# Patient Record
Sex: Female | Born: 1935 | Race: White | Hispanic: No
Health system: Midwestern US, Community
[De-identification: ages and names within clinical notes are randomized; demographics above are authoritative.]

## PROBLEM LIST (undated history)

## (undated) DIAGNOSIS — I10 Essential (primary) hypertension: Secondary | ICD-10-CM

## (undated) DIAGNOSIS — F32A Depression, unspecified: Secondary | ICD-10-CM

## (undated) DIAGNOSIS — J189 Pneumonia, unspecified organism: Secondary | ICD-10-CM

## (undated) DIAGNOSIS — E039 Hypothyroidism, unspecified: Secondary | ICD-10-CM

## (undated) DIAGNOSIS — K5090 Crohn's disease, unspecified, without complications: Secondary | ICD-10-CM

## (undated) DIAGNOSIS — M1990 Unspecified osteoarthritis, unspecified site: Secondary | ICD-10-CM

## (undated) DIAGNOSIS — F419 Anxiety disorder, unspecified: Secondary | ICD-10-CM

## (undated) DIAGNOSIS — F329 Major depressive disorder, single episode, unspecified: Secondary | ICD-10-CM

## (undated) DIAGNOSIS — M810 Age-related osteoporosis without current pathological fracture: Secondary | ICD-10-CM

## (undated) DIAGNOSIS — I8390 Asymptomatic varicose veins of unspecified lower extremity: Secondary | ICD-10-CM

## (undated) HISTORY — PX: COLONOSCOPY: SHX174

## (undated) HISTORY — PX: ABDOMINAL HYSTERECTOMY: SHX81

---

## 2012-02-01 DIAGNOSIS — I10 Essential (primary) hypertension: Secondary | ICD-10-CM | POA: Diagnosis not present

## 2012-02-01 DIAGNOSIS — E7800 Pure hypercholesterolemia, unspecified: Secondary | ICD-10-CM | POA: Diagnosis not present

## 2012-02-01 DIAGNOSIS — E039 Hypothyroidism, unspecified: Secondary | ICD-10-CM | POA: Diagnosis not present

## 2012-02-08 DIAGNOSIS — I10 Essential (primary) hypertension: Secondary | ICD-10-CM | POA: Diagnosis not present

## 2012-02-08 DIAGNOSIS — J3089 Other allergic rhinitis: Secondary | ICD-10-CM | POA: Diagnosis not present

## 2012-02-08 DIAGNOSIS — E039 Hypothyroidism, unspecified: Secondary | ICD-10-CM | POA: Diagnosis not present

## 2012-02-08 DIAGNOSIS — E7800 Pure hypercholesterolemia, unspecified: Secondary | ICD-10-CM | POA: Diagnosis not present

## 2012-04-17 DIAGNOSIS — J0100 Acute maxillary sinusitis, unspecified: Secondary | ICD-10-CM | POA: Diagnosis not present

## 2012-08-01 DIAGNOSIS — E7800 Pure hypercholesterolemia, unspecified: Secondary | ICD-10-CM | POA: Diagnosis not present

## 2012-08-01 DIAGNOSIS — I10 Essential (primary) hypertension: Secondary | ICD-10-CM | POA: Diagnosis not present

## 2012-08-01 DIAGNOSIS — E039 Hypothyroidism, unspecified: Secondary | ICD-10-CM | POA: Diagnosis not present

## 2012-08-08 DIAGNOSIS — F411 Generalized anxiety disorder: Secondary | ICD-10-CM | POA: Diagnosis not present

## 2012-08-08 DIAGNOSIS — E039 Hypothyroidism, unspecified: Secondary | ICD-10-CM | POA: Diagnosis not present

## 2012-08-08 DIAGNOSIS — R7309 Other abnormal glucose: Secondary | ICD-10-CM | POA: Diagnosis not present

## 2012-08-08 DIAGNOSIS — G4700 Insomnia, unspecified: Secondary | ICD-10-CM | POA: Diagnosis not present

## 2012-08-08 DIAGNOSIS — Z23 Encounter for immunization: Secondary | ICD-10-CM | POA: Diagnosis not present

## 2013-03-07 DIAGNOSIS — K591 Functional diarrhea: Secondary | ICD-10-CM | POA: Diagnosis not present

## 2013-03-08 DIAGNOSIS — H26499 Other secondary cataract, unspecified eye: Secondary | ICD-10-CM | POA: Diagnosis not present

## 2013-03-08 DIAGNOSIS — R197 Diarrhea, unspecified: Secondary | ICD-10-CM | POA: Diagnosis not present

## 2013-03-09 DIAGNOSIS — R197 Diarrhea, unspecified: Secondary | ICD-10-CM | POA: Diagnosis not present

## 2013-03-15 DIAGNOSIS — F341 Dysthymic disorder: Secondary | ICD-10-CM | POA: Diagnosis not present

## 2013-03-15 DIAGNOSIS — E782 Mixed hyperlipidemia: Secondary | ICD-10-CM | POA: Diagnosis not present

## 2013-03-15 DIAGNOSIS — R197 Diarrhea, unspecified: Secondary | ICD-10-CM | POA: Diagnosis not present

## 2013-03-15 DIAGNOSIS — M545 Low back pain: Secondary | ICD-10-CM | POA: Diagnosis not present

## 2013-03-15 DIAGNOSIS — Z79899 Other long term (current) drug therapy: Secondary | ICD-10-CM | POA: Diagnosis not present

## 2013-03-28 DIAGNOSIS — M1610 Unilateral primary osteoarthritis, unspecified hip: Secondary | ICD-10-CM | POA: Diagnosis not present

## 2013-04-06 DIAGNOSIS — M1610 Unilateral primary osteoarthritis, unspecified hip: Secondary | ICD-10-CM | POA: Diagnosis not present

## 2013-04-06 DIAGNOSIS — M169 Osteoarthritis of hip, unspecified: Secondary | ICD-10-CM | POA: Diagnosis not present

## 2013-05-22 DIAGNOSIS — M1610 Unilateral primary osteoarthritis, unspecified hip: Secondary | ICD-10-CM | POA: Diagnosis not present

## 2013-07-05 DIAGNOSIS — R197 Diarrhea, unspecified: Secondary | ICD-10-CM | POA: Diagnosis not present

## 2013-07-05 DIAGNOSIS — Z006 Encounter for examination for normal comparison and control in clinical research program: Secondary | ICD-10-CM | POA: Diagnosis not present

## 2013-07-05 DIAGNOSIS — I10 Essential (primary) hypertension: Secondary | ICD-10-CM | POA: Diagnosis not present

## 2013-07-05 DIAGNOSIS — F341 Dysthymic disorder: Secondary | ICD-10-CM | POA: Diagnosis not present

## 2013-07-09 DIAGNOSIS — R1013 Epigastric pain: Secondary | ICD-10-CM | POA: Diagnosis not present

## 2013-07-09 DIAGNOSIS — R1084 Generalized abdominal pain: Secondary | ICD-10-CM | POA: Diagnosis not present

## 2013-07-09 DIAGNOSIS — R197 Diarrhea, unspecified: Secondary | ICD-10-CM | POA: Diagnosis not present

## 2013-07-09 DIAGNOSIS — R195 Other fecal abnormalities: Secondary | ICD-10-CM | POA: Diagnosis not present

## 2013-07-12 DIAGNOSIS — R1012 Left upper quadrant pain: Secondary | ICD-10-CM | POA: Diagnosis not present

## 2013-07-12 DIAGNOSIS — R1084 Generalized abdominal pain: Secondary | ICD-10-CM | POA: Diagnosis not present

## 2013-07-12 DIAGNOSIS — R197 Diarrhea, unspecified: Secondary | ICD-10-CM | POA: Diagnosis not present

## 2013-07-12 DIAGNOSIS — R1013 Epigastric pain: Secondary | ICD-10-CM | POA: Diagnosis not present

## 2013-08-01 DIAGNOSIS — R195 Other fecal abnormalities: Secondary | ICD-10-CM | POA: Diagnosis not present

## 2013-08-01 DIAGNOSIS — R1084 Generalized abdominal pain: Secondary | ICD-10-CM | POA: Diagnosis not present

## 2013-08-01 DIAGNOSIS — R197 Diarrhea, unspecified: Secondary | ICD-10-CM | POA: Diagnosis not present

## 2013-08-01 DIAGNOSIS — K5289 Other specified noninfective gastroenteritis and colitis: Secondary | ICD-10-CM | POA: Diagnosis not present

## 2013-08-22 DIAGNOSIS — M1610 Unilateral primary osteoarthritis, unspecified hip: Secondary | ICD-10-CM | POA: Diagnosis not present

## 2013-08-28 DIAGNOSIS — Z23 Encounter for immunization: Secondary | ICD-10-CM | POA: Diagnosis not present

## 2013-09-03 DIAGNOSIS — R195 Other fecal abnormalities: Secondary | ICD-10-CM | POA: Diagnosis not present

## 2013-09-03 DIAGNOSIS — K5289 Other specified noninfective gastroenteritis and colitis: Secondary | ICD-10-CM | POA: Diagnosis not present

## 2013-09-17 DIAGNOSIS — E039 Hypothyroidism, unspecified: Secondary | ICD-10-CM | POA: Diagnosis not present

## 2013-09-17 DIAGNOSIS — I10 Essential (primary) hypertension: Secondary | ICD-10-CM | POA: Diagnosis not present

## 2013-09-17 DIAGNOSIS — R5381 Other malaise: Secondary | ICD-10-CM | POA: Diagnosis not present

## 2013-09-24 DIAGNOSIS — J329 Chronic sinusitis, unspecified: Secondary | ICD-10-CM | POA: Diagnosis not present

## 2013-10-15 DIAGNOSIS — K5289 Other specified noninfective gastroenteritis and colitis: Secondary | ICD-10-CM | POA: Diagnosis not present

## 2013-12-11 DIAGNOSIS — M1610 Unilateral primary osteoarthritis, unspecified hip: Secondary | ICD-10-CM | POA: Diagnosis not present

## 2013-12-14 DIAGNOSIS — M1610 Unilateral primary osteoarthritis, unspecified hip: Secondary | ICD-10-CM | POA: Diagnosis not present

## 2013-12-14 DIAGNOSIS — M169 Osteoarthritis of hip, unspecified: Secondary | ICD-10-CM | POA: Diagnosis not present

## 2014-03-13 DIAGNOSIS — I10 Essential (primary) hypertension: Secondary | ICD-10-CM | POA: Diagnosis not present

## 2014-03-13 DIAGNOSIS — Z79899 Other long term (current) drug therapy: Secondary | ICD-10-CM | POA: Diagnosis not present

## 2014-03-13 DIAGNOSIS — E7800 Pure hypercholesterolemia, unspecified: Secondary | ICD-10-CM | POA: Diagnosis not present

## 2014-03-13 DIAGNOSIS — E039 Hypothyroidism, unspecified: Secondary | ICD-10-CM | POA: Diagnosis not present

## 2014-03-13 DIAGNOSIS — F341 Dysthymic disorder: Secondary | ICD-10-CM | POA: Diagnosis not present

## 2014-03-13 DIAGNOSIS — Z0000 Encounter for general adult medical examination without abnormal findings: Secondary | ICD-10-CM | POA: Diagnosis not present

## 2014-04-02 DIAGNOSIS — Z1231 Encounter for screening mammogram for malignant neoplasm of breast: Secondary | ICD-10-CM | POA: Diagnosis not present

## 2014-04-02 DIAGNOSIS — R928 Other abnormal and inconclusive findings on diagnostic imaging of breast: Secondary | ICD-10-CM | POA: Diagnosis not present

## 2014-05-02 DIAGNOSIS — M1610 Unilateral primary osteoarthritis, unspecified hip: Secondary | ICD-10-CM | POA: Diagnosis not present

## 2014-05-06 DIAGNOSIS — R928 Other abnormal and inconclusive findings on diagnostic imaging of breast: Secondary | ICD-10-CM | POA: Diagnosis not present

## 2014-07-05 DIAGNOSIS — H524 Presbyopia: Secondary | ICD-10-CM | POA: Diagnosis not present

## 2014-07-05 DIAGNOSIS — H2589 Other age-related cataract: Secondary | ICD-10-CM | POA: Diagnosis not present

## 2014-08-01 DIAGNOSIS — K5289 Other specified noninfective gastroenteritis and colitis: Secondary | ICD-10-CM | POA: Diagnosis not present

## 2014-08-02 DIAGNOSIS — K5289 Other specified noninfective gastroenteritis and colitis: Secondary | ICD-10-CM | POA: Diagnosis not present

## 2014-08-29 DIAGNOSIS — K5289 Other specified noninfective gastroenteritis and colitis: Secondary | ICD-10-CM | POA: Diagnosis not present

## 2014-09-02 DIAGNOSIS — J159 Unspecified bacterial pneumonia: Secondary | ICD-10-CM | POA: Diagnosis not present

## 2014-09-12 DIAGNOSIS — F418 Other specified anxiety disorders: Secondary | ICD-10-CM | POA: Diagnosis not present

## 2014-09-12 DIAGNOSIS — E039 Hypothyroidism, unspecified: Secondary | ICD-10-CM | POA: Diagnosis not present

## 2014-09-12 DIAGNOSIS — Z23 Encounter for immunization: Secondary | ICD-10-CM | POA: Diagnosis not present

## 2014-09-12 DIAGNOSIS — M810 Age-related osteoporosis without current pathological fracture: Secondary | ICD-10-CM | POA: Diagnosis not present

## 2014-09-12 DIAGNOSIS — I10 Essential (primary) hypertension: Secondary | ICD-10-CM | POA: Diagnosis not present

## 2014-09-12 DIAGNOSIS — E780 Pure hypercholesterolemia: Secondary | ICD-10-CM | POA: Diagnosis not present

## 2014-10-29 DIAGNOSIS — K5289 Other specified noninfective gastroenteritis and colitis: Secondary | ICD-10-CM | POA: Diagnosis not present

## 2014-10-31 DIAGNOSIS — M160 Bilateral primary osteoarthritis of hip: Secondary | ICD-10-CM | POA: Diagnosis not present

## 2015-01-15 DIAGNOSIS — Z79899 Other long term (current) drug therapy: Secondary | ICD-10-CM | POA: Diagnosis not present

## 2015-01-15 DIAGNOSIS — E039 Hypothyroidism, unspecified: Secondary | ICD-10-CM | POA: Diagnosis not present

## 2015-01-15 DIAGNOSIS — Z23 Encounter for immunization: Secondary | ICD-10-CM | POA: Diagnosis not present

## 2015-01-15 DIAGNOSIS — I10 Essential (primary) hypertension: Secondary | ICD-10-CM | POA: Diagnosis not present

## 2015-01-15 DIAGNOSIS — E780 Pure hypercholesterolemia: Secondary | ICD-10-CM | POA: Diagnosis not present

## 2015-01-21 ENCOUNTER — Ambulatory Visit: Payer: Self-pay | Admitting: Orthopedic Surgery

## 2015-01-21 NOTE — H&P (Signed)
Diana Ramsey. Wignall DOB: Mar 06, 1935 Single / Language: Diana Ramsey / Race: White Female Date of Admission:  Right Hip Pain History of Present Illness The patient is a 80 year old female who comes in for a preoperative History and Physical. The patient is scheduled for a right total hip arthroplasty (anterior approach) to be performed by Dr. Dione Plover. Aluisio, MD at [facility] on 02-19-2015. The patient is a 80 year old female who presents for follow up of their hip. The patient is being followed for their bilateral (right is worse at this point) osteoarthritis. They were initially seen as a second opinion. Symptoms reported include: pain, pain after sitting, aching, stiffness, pain with weightbearing and difficulty arising from chair. The pain is in her groin, lateral hip, radiating down to her anterior thigh. She states that she has had left hip pain in the past, but it does not bother her much now. She says it is getting harder to bend and do things that require bending. She is hurting at times at night. She is able to do most of what she desires with some limitations. The patient feels that they are doing poorly and report their pain level to be 6 / 10. Current treatment includes: activity modification and NSAIDs. The following medication has been used for pain control: antiinflammatory medication (Aleve prn). Unfortunately, her problems have gotten much worse compared to when she was initially seen six months ago. She now states that she is having pain at all times. It is limiting what she can and cannot do. She had an intraarticular injection over the summer which did not provide any benefit. Pain is in her groin and anterior thigh radiating towards her knee. It is worse with weightbearing but also occurring at rest. It is occurring at night. She states that she cannot really tolerate it anymore.  They have been treated conservatively in the past for the above stated problem and despite  conservative measures, they continue to have progressive pain and severe functional limitations and dysfunction. They have failed non-operative management including home exercise, medications, and injections. It is felt that they would benefit from undergoing total joint replacement. Risks and benefits of the procedure have been discussed with the patient and they elect to proceed with surgery. There are no active contraindications to surgery such as ongoing infection or rapidly progressive neurological disease.  Problem List/Past Medical Primary localized osteoarthritis of right hip (M16.11) Hypothyroidism High blood pressure Ulcerative Colitis frequent flareups Hypercholesterolemia Anxiety Disorder Depression Pneumonia Past History - Last bout October 2015 Varicose veins Osteoporosis Measles Mumps Peripheral Neuropathy Feet  Allergies  Penicillins Rash.  Family History  Liver Disease, Chronic Father. Cancer Father. Hypertension Father, Mother.  Social History  Living situation live alone Exercise Exercises weekly; does running / walking Current drinker 05/02/2014: Currently drinks wine only occasionally per week Children 1 Current work status retired Tobacco use Former smoker. 05/02/2014: smoke(d) less than 1/2 pack(s) per day Tobacco / smoke exposure 05/02/2014: no Marital status widowed Not under pain contract No history of drug/alcohol rehab Number of flights of stairs before winded 4-5 Post-Surgical Plans Home  Medication History  CeleBREX (200MG  Capsule, Oral daily) Active. Calcium Lactate (600MG  Tablet, Oral two times daily) Active. ALPRAZolam (0.5MG  Tablet, Oral two times daily) Active. Pravastatin Sodium (80MG  Tablet, Oral daily) Active. Boniva (150MG  Tablet, Oral monthly) Active. Mirtazapine (30MG  Tablet, Oral daily) Active. Metoprolol Tartrate (50MG  Tablet, Oral two times daily) Active. Sertraline HCl (100MG  Tablet, Oral  daily) Active. TraMADol HCl (  50MG  Tablet, 2 Oral per day as needed) Active. Levothyroxine Sodium (50MCG Tablet, Oral daily) Active. Budesonide ER (3MG  Capsule ER 24HR, Oral daily) Active.  Past Surgical History Hysterectomy Date: 29. partial (non-cancerous)  Review of Systems General Not Present- Chills, Fatigue, Fever, Memory Loss, Night Sweats, Weight Gain and Weight Loss. Skin Not Present- Eczema, Hives, Itching, Lesions and Rash. HEENT Not Present- Dentures, Double Vision, Headache, Hearing Loss, Tinnitus and Visual Loss. Respiratory Not Present- Allergies, Chronic Cough, Coughing up blood, Shortness of breath at rest and Shortness of breath with exertion. Cardiovascular Not Present- Chest Pain, Difficulty Breathing Lying Down, Murmur, Palpitations, Racing/skipping heartbeats and Swelling. Gastrointestinal Not Present- Abdominal Pain, Bloody Stool, Constipation, Diarrhea, Difficulty Swallowing, Heartburn, Jaundice, Loss of appetitie, Nausea and Vomiting. Female Genitourinary Not Present- Blood in Urine, Discharge, Flank Pain, Incontinence, Painful Urination, Urgency, Urinary frequency, Urinary Retention, Urinating at Night and Weak urinary stream. Musculoskeletal Present- Joint Pain. Not Present- Back Pain, Joint Swelling, Morning Stiffness, Muscle Pain, Muscle Weakness and Spasms. Neurological Not Present- Blackout spells, Difficulty with balance, Dizziness, Paralysis, Tremor and Weakness. Psychiatric Not Present- Insomnia.  Vitals Weight: 142 lb Height: 62in Weight was reported by patient. Body Surface Area: 1.65 m Body Mass Index: 25.97 kg/m  BP: 162/78 (Sitting, Right Arm, Standard)   Physical Exam The physical exam findings are as follows: Note:Patietn is a 80 year old female with continued hip pain. Patient is accompanied today by her sister, Diana Ramsey.  General Mental Status -Alert, cooperative and good historian. General Appearance-pleasant, Not in acute  distress. Orientation-Oriented X3. Build & Nutrition-Well nourished and Well developed.  Head and Neck Head-normocephalic, atraumatic . Neck Global Assessment - supple, no bruit auscultated on the right, no bruit auscultated on the left.  Eye Pupil - Bilateral-Regular and Round. Motion - Bilateral-EOMI.  Chest and Lung Exam Auscultation Breath sounds - clear at anterior chest wall and clear at posterior chest wall. Adventitious sounds - No Adventitious sounds.  Cardiovascular Auscultation Rhythm - Regular rate and rhythm. Heart Sounds - S1 WNL and S2 WNL. Murmurs & Other Heart Sounds - Auscultation of the heart reveals - No Murmurs.  Abdomen Palpation/Percussion Tenderness - Abdomen is non-tender to palpation. Rigidity (guarding) - Abdomen is soft. Auscultation Auscultation of the abdomen reveals - Bowel sounds normal.  Female Genitourinary Note: Not done, not pertinent to present illness   Musculoskeletal Note: On exam, well developed female in no distress. Her left hip has normal motion with no discomfort. Her right hip flexion is about 100, rotation in 20 with significant pain, out 30 with significant pain and abduction 30 with significant pain. She has an antalgic gait pattern.  RADIOGRAPHS: We obtained new radiographs today. AP pelvis and lateral right hip show she still has joint space left superiorly but inferiorly has very large osteophytes especially noticed on the lateral view.  Assessment & Plan Primary localized osteoarthritis of right hip (M16.11) Note:Surgical Plans: Right Total Hip Repalcement - Anterior Approach  Disposition: Home  PCP: Dr. Kennith Maes - Patient has been seen preoperatively and felt to be stable for surgery. [facility]  IV TXA  Anesthesia Issues: None  Signed electronically by Joelene Millin, III PA-C

## 2015-01-21 NOTE — Progress Notes (Addendum)
Diana Ramsey. Diana Ramsey DOB: 04-Feb-1935 Single / Language: Cleophus Molt / Race: White Female Date of Admission: 02-19-2015 CC: Right Hip Pain History of Present Illness The patient is a 80 year old female who comes in for a preoperative History and Physical. The patient is scheduled for a right total hip arthroplasty (anterior approach) to be performed by Dr. Dione Plover. Aluisio, MD at [facility] on 02-19-2015. The patient is a 80 year old female who presents for follow up of their hip. The patient is being followed for their bilateral (right is worse at this point) osteoarthritis. They were initially seen as a second opinion. Symptoms reported include: pain, pain after sitting, aching, stiffness, pain with weightbearing and difficulty arising from chair. The pain is in her groin, lateral hip, radiating down to her anterior thigh. She states that she has had left hip pain in the past, but it does not bother her much now. She says it is getting harder to bend and do things that require bending. She is hurting at times at night. She is able to do most of what she desires with some limitations. The patient feels that they are doing poorly and report their pain level to be 6 / 10. Current treatment includes: activity modification and NSAIDs. The following medication has been used for pain control: antiinflammatory medication (Aleve prn). Unfortunately, her problems have gotten much worse compared to when she was initially seen six months ago. She now states that she is having pain at all times. It is limiting what she can and cannot do. She had an intraarticular injection over the summer which did not provide any benefit. Pain is in her groin and anterior thigh radiating towards her knee. It is worse with weightbearing but also occurring at rest. It is occurring at night. She states that she cannot really tolerate it anymore.  They have been treated conservatively in the past for the above stated problem and  despite conservative measures, they continue to have progressive pain and severe functional limitations and dysfunction. They have failed non-operative management including home exercise, medications, and injections. It is felt that they would benefit from undergoing total joint replacement. Risks and benefits of the procedure have been discussed with the patient and they elect to proceed with surgery. There are no active contraindications to surgery such as ongoing infection or rapidly progressive neurological disease.  Problem List/Past Medical Primary localized osteoarthritis of right hip (M16.11) Hypothyroidism High blood pressure Ulcerative Colitis frequent flareups Hypercholesterolemia Anxiety Disorder Depression Pneumonia Past History - Last bout October 2015 Varicose veins Osteoporosis Measles Mumps Peripheral Neuropathy Feet  Allergies  Penicillins Rash.  Family History  Liver Disease, Chronic Father. Cancer Father. Hypertension Father, Mother.  Social History  Living situation live alone Exercise Exercises weekly; does running / walking Current drinker 05/02/2014: Currently drinks wine only occasionally per week Children 1 Current work status retired Tobacco use Former smoker. 05/02/2014: smoke(d) less than 1/2 pack(s) per day Tobacco / smoke exposure 05/02/2014: no Marital status widowed Not under pain contract No history of drug/alcohol rehab Number of flights of stairs before winded 4-5 Post-Surgical Plans Home  Medication History  CeleBREX (200MG  Capsule, Oral daily) Active. Calcium Lactate (600MG  Tablet, Oral two times daily) Active. ALPRAZolam (0.5MG  Tablet, Oral two times daily) Active. Pravastatin Sodium (80MG  Tablet, Oral daily) Active. Boniva (150MG  Tablet, Oral monthly) Active. Mirtazapine (30MG  Tablet, Oral daily) Active. Metoprolol Tartrate (50MG  Tablet, Oral two times daily) Active. Sertraline HCl (100MG   Tablet, Oral daily) Active. TraMADol  HCl (50MG  Tablet, 2 Oral per day as needed) Active. Levothyroxine Sodium (50MCG Tablet, Oral daily) Active. Budesonide ER (3MG  Capsule ER 24HR, Oral daily) Active.  Past Surgical History Hysterectomy Date: 64. partial (non-cancerous)  Review of Systems General Not Present- Chills, Fatigue, Fever, Memory Loss, Night Sweats, Weight Gain and Weight Loss. Skin Not Present- Eczema, Hives, Itching, Lesions and Rash. HEENT Not Present- Dentures, Double Vision, Headache, Hearing Loss, Tinnitus and Visual Loss. Respiratory Not Present- Allergies, Chronic Cough, Coughing up blood, Shortness of breath at rest and Shortness of breath with exertion. Cardiovascular Not Present- Chest Pain, Difficulty Breathing Lying Down, Murmur, Palpitations, Racing/skipping heartbeats and Swelling. Gastrointestinal Not Present- Abdominal Pain, Bloody Stool, Constipation, Diarrhea, Difficulty Swallowing, Heartburn, Jaundice, Loss of appetitie, Nausea and Vomiting. Female Genitourinary Not Present- Blood in Urine, Discharge, Flank Pain, Incontinence, Painful Urination, Urgency, Urinary frequency, Urinary Retention, Urinating at Night and Weak urinary stream. Musculoskeletal Present- Joint Pain. Not Present- Back Pain, Joint Swelling, Morning Stiffness, Muscle Pain, Muscle Weakness and Spasms. Neurological Not Present- Blackout spells, Difficulty with balance, Dizziness, Paralysis, Tremor and Weakness. Psychiatric Not Present- Insomnia.  Vitals Weight: 142 lb Height: 62in Weight was reported by patient. Body Surface Area: 1.65 m Body Mass Index: 25.97 kg/m  BP: 162/78 (Sitting, Right Arm, Standard)   Physical Exam The physical exam findings are as follows: Note:Patietn is a 80 year old female with continued hip pain. Patient is accompanied today by her sister, Vaughan Basta.  General Mental Status -Alert, cooperative and good historian. General Appearance-pleasant,  Not in acute distress. Orientation-Oriented X3. Build & Nutrition-Well nourished and Well developed.  Head and Neck Head-normocephalic, atraumatic . Neck Global Assessment - supple, no bruit auscultated on the right, no bruit auscultated on the left.  Eye Pupil - Bilateral-Regular and Round. Motion - Bilateral-EOMI.  Chest and Lung Exam Auscultation Breath sounds - clear at anterior chest wall and clear at posterior chest wall. Adventitious sounds - No Adventitious sounds.  Cardiovascular Auscultation Rhythm - Regular rate and rhythm. Heart Sounds - S1 WNL and S2 WNL. Murmurs & Other Heart Sounds - Auscultation of the heart reveals - No Murmurs.  Abdomen Palpation/Percussion Tenderness - Abdomen is non-tender to palpation. Rigidity (guarding) - Abdomen is soft. Auscultation Auscultation of the abdomen reveals - Bowel sounds normal.  Female Genitourinary Note: Not done, not pertinent to present illness   Musculoskeletal Note: On exam, well developed female in no distress. Her left hip has normal motion with no discomfort. Her right hip flexion is about 100, rotation in 20 with significant pain, out 30 with significant pain and abduction 30 with significant pain. She has an antalgic gait pattern.  RADIOGRAPHS: We obtained new radiographs today. AP pelvis and lateral right hip show she still has joint space left superiorly but inferiorly has very large osteophytes especially noticed on the lateral view.  Assessment & Plan Primary localized osteoarthritis of right hip (M16.11) Note:Surgical Plans: Right Total Hip Repalcement - Anterior Approach  Disposition: Home  PCP: Dr. Kennith Maes - Patient has been seen preoperatively and felt to be stable for surgery. [facility]  IV TXA  Anesthesia Issues: None  Signed electronically by Joelene Millin, III PA-C

## 2015-01-23 DIAGNOSIS — K5289 Other specified noninfective gastroenteritis and colitis: Secondary | ICD-10-CM | POA: Diagnosis not present

## 2015-01-30 ENCOUNTER — Ambulatory Visit: Payer: Self-pay | Admitting: Orthopedic Surgery

## 2015-01-30 NOTE — Progress Notes (Signed)
Preoperative surgical orders have been place into the Epic hospital system for Diana Ramsey on 01/30/2015, 2:03 PM  by Mickel Crow for surgery on 02-19-15.  Preop Total Hip - Anterior Approach orders including Experel Injecion, IV Tylenol, and IV Decadron as long as there are no contraindications to the above medications. Arlee Muslim, PA-C

## 2015-02-10 NOTE — Patient Instructions (Signed)
Your procedure is scheduled on:  02/19/15  [facility]  Report to [place] at     1:05 PM   Call this number if you have problems the morning of surgery: [phone]        Do not eat food  after Midnight. Tuesday NIGHT--- MAY HAVE CLEAR LIQUIDS Wednesday MORNING UNTIL 09:30am-  THEN NONE   Take these medicines the morning of surgery with A SIP OF WATER:METOPROLOL, Alprazolam, Levothyroxine,Zoloft May take Tramadol if needed   .  Contacts, dentures or partial plates, or metal hairpins  can not be worn to surgery. Your family will be responsible for glasses, dentures, hearing aides while you are in surgery  Leave suitcase in the car. After surgery it may be brought to your room.  For patients admitted to the hospital, checkout time is 11:00 AM day of  discharge.         [facility] IS NOT RESPONSIBLE FOR ANY VALUABLES  Patients discharged the day of surgery will not be allowed to drive home. IF going home the day of surgery, you must have a driver and someone to stay with you for the first 24 hours                                                                                                                                        [facility] - Preparing for Surgery Before surgery, you can play an important role.  Because skin is not sterile, your skin needs to be as free of germs as possible.  You can reduce the number of germs on your skin by washing with CHG (chlorahexidine gluconate) soap before surgery.  CHG is an antiseptic cleaner which kills germs and bonds with the skin to continue killing germs even after washing. Please DO NOT use if you have an allergy to CHG or antibacterial soaps.  If your skin becomes reddened/irritated stop using the CHG and inform your nurse when you arrive at Short Stay. Do not shave (including legs and underarms) for at least 48 hours prior to the first CHG shower.  You may  shave your face/neck. Please follow these instructions carefully:  1.  Shower with CHG Soap the night before surgery and the  morning of Surgery.  2.  If you choose to wash your hair, wash your hair first as usual with your  normal  shampoo.  3.  After you shampoo, rinse your hair and body thoroughly to remove the  shampoo.                           4.  Use CHG as you would any other liquid soap.  You can apply chg directly  to the skin and wash  Gently with a scrungie or clean washcloth.  5.  Apply the CHG Soap to your body ONLY FROM THE NECK DOWN.   Do not use on face/ open                           Wound or open sores. Avoid contact with eyes, ears mouth and genitals (private parts).                       Wash face,  Genitals (private parts) with your normal soap.             6.  Wash thoroughly, paying special attention to the area where your surgery  will be performed.  7.  Thoroughly rinse your body with warm water from the neck down.  8.  DO NOT shower/wash with your normal soap after using and rinsing off  the CHG Soap.                9.  Pat yourself dry with a clean towel.            10.  Wear clean pajamas.            11.  Place clean sheets on your bed the night of your first shower and do not  sleep with pets. Day of Surgery : Do not apply any lotions/deodorants the morning of surgery.  Please wear clean clothes to the hospital/surgery center.  FAILURE TO FOLLOW THESE INSTRUCTIONS MAY RESULT IN THE CANCELLATION OF YOUR SURGERY PATIENT SIGNATURE_________________________________  NURSE SIGNATURE__________________________________  ________________________________________________________________________    CLEAR LIQUID DIET   Foods Allowed                                                                     Foods Excluded  Coffee and tea, regular and decaf                             liquids that you cannot  Plain Jell-O in any flavor                                              see through such as: Fruit ices (not with fruit pulp)                                     milk, soups, orange juice  Iced Popsicles                                    All solid food Carbonated beverages, regular and diet                                    Cranberry, grape and apple juices Sports drinks like Gatorade Lightly seasoned clear broth or consume(fat free) Sugar, honey syrup  Sample Menu Breakfast                                Lunch                                     Supper Cranberry juice                    Beef broth                            Chicken broth Jell-O                                     Grape juice                           Apple juice Coffee or tea                        Jell-O                                      Popsicle                                                Coffee or tea                        Coffee or tea  _____________________________________________________________________   WHAT IS A BLOOD TRANSFUSION? Blood Transfusion Information  A transfusion is the replacement of blood or some of its parts. Blood is made up of multiple cells which provide different functions.  Red blood cells carry oxygen and are used for blood loss replacement.  White blood cells fight against infection.  Platelets control bleeding.  Plasma helps clot blood.  Other blood products are available for specialized needs, such as hemophilia or other clotting disorders. BEFORE THE TRANSFUSION  Who gives blood for transfusions?   Healthy volunteers who are fully evaluated to make sure their blood is safe. This is blood bank blood. Transfusion therapy is the safest it has ever been in the practice of medicine. Before blood is taken from a donor, a complete history is taken to make sure that person has no history of diseases nor engages in risky social behavior (examples are intravenous drug use or sexual activity with multiple partners). The donor's  travel history is screened to minimize risk of transmitting infections, such as malaria. The donated blood is tested for signs of infectious diseases, such as HIV and hepatitis. The blood is then tested to be sure it is compatible with you in order to minimize the chance of a transfusion reaction. If you or a relative donates blood, this is often done in anticipation of surgery and is not appropriate for emergency situations. It takes many days to process the donated blood. RISKS AND COMPLICATIONS Although transfusion therapy is very safe and saves many lives, the main dangers of transfusion include:  1. Getting an infectious disease. 2. Developing a transfusion reaction. This is an allergic reaction to something in the blood you were given. Every precaution is taken to prevent this. The decision to have a blood transfusion has been considered carefully by your caregiver before blood is given. Blood is not given unless the benefits outweigh the risks. AFTER THE TRANSFUSION  Right after receiving a blood transfusion, you will usually feel much better and more energetic. This is especially true if your red blood cells have gotten low (anemic). The transfusion raises the level of the red blood cells which carry oxygen, and this usually causes an energy increase.  The nurse administering the transfusion will monitor you carefully for complications. HOME CARE INSTRUCTIONS  No special instructions are needed after a transfusion. You may find your energy is better. Speak with your caregiver about any limitations on activity for underlying diseases you may have. SEEK MEDICAL CARE IF:   Your condition is not improving after your transfusion.  You develop redness or irritation at the intravenous (IV) site. SEEK IMMEDIATE MEDICAL CARE IF:  Any of the following symptoms occur over the next 12 hours:  Shaking chills.  You have a temperature by mouth above 102 F (38.9 C), not controlled by  medicine.  Chest, back, or muscle pain.  People around you feel you are not acting correctly or are confused.  Shortness of breath or difficulty breathing.  Dizziness and fainting.  You get a rash or develop hives.  You have a decrease in urine output.  Your urine turns a dark color or changes to pink, red, or brown. Any of the following symptoms occur over the next 10 days:  You have a temperature by mouth above 102 F (38.9 C), not controlled by medicine.  Shortness of breath.  Weakness after normal activity.  The white part of the eye turns yellow (jaundice).  You have a decrease in the amount of urine or are urinating less often.  Your urine turns a dark color or changes to pink, red, or brown. Document Released: 10/29/2000 Document Revised: 01/24/2012 Document Reviewed: 06/17/2008 ExitCare Patient Information 2014 [place].  _______________________________________________________________________  Incentive Spirometer  An incentive spirometer is a tool that can help keep your lungs clear and active. This tool measures how well you are filling your lungs with each breath. Taking long deep breaths may help reverse or decrease the chance of developing breathing (pulmonary) problems (especially infection) following:  A long period of time when you are unable to move or be active. BEFORE THE PROCEDURE   If the spirometer includes an indicator to show your best effort, your nurse or respiratory therapist will set it to a desired goal.  If possible, sit up straight or lean slightly forward. Try not to slouch.  Hold the incentive spirometer in an upright position. INSTRUCTIONS FOR USE  3. Sit on the edge of your bed if possible, or sit up as far as you can in bed or on a chair. 4. Hold the incentive spirometer in an upright position. 5. Breathe out normally. 6. Place the mouthpiece in your mouth and seal your lips tightly around it. 7. Breathe in slowly and as  deeply as possible, raising the piston or the ball toward the top of the column. 8. Hold your breath for 3-5 seconds or for as long as possible. Allow the piston or ball to fall to the bottom of the column. 9. Remove the mouthpiece from your mouth and breathe out  normally. 10. Rest for a few seconds and repeat Steps 1 through 7 at least 10 times every 1-2 hours when you are awake. Take your time and take a few normal breaths between deep breaths. 11. The spirometer may include an indicator to show your best effort. Use the indicator as a goal to work toward during each repetition. 12. After each set of 10 deep breaths, practice coughing to be sure your lungs are clear. If you have an incision (the cut made at the time of surgery), support your incision when coughing by placing a pillow or rolled up towels firmly against it. Once you are able to get out of bed, walk around indoors and cough well. You may stop using the incentive spirometer when instructed by your caregiver.  RISKS AND COMPLICATIONS  Take your time so you do not get dizzy or light-headed.  If you are in pain, you may need to take or ask for pain medication before doing incentive spirometry. It is harder to take a deep breath if you are having pain. AFTER USE  Rest and breathe slowly and easily.  It can be helpful to keep track of a log of your progress. Your caregiver can provide you with a simple table to help with this. If you are using the spirometer at home, follow these instructions: [place] IF:   You are having difficultly using the spirometer.  You have trouble using the spirometer as often as instructed.  Your pain medication is not giving enough relief while using the spirometer.  You develop fever of 100.5 F (38.1 C) or higher. SEEK IMMEDIATE MEDICAL CARE IF:   You cough up bloody sputum that had not been present before.  You develop fever of 102 F (38.9 C) or greater.  You develop worsening  pain at or near the incision site. MAKE SURE YOU:   Understand these instructions.  Will watch your condition.  Will get help right away if you are not doing well or get worse. Document Released: 03/14/2007 Document Revised: 01/24/2012 Document Reviewed: 05/15/2007 [facility] Patient Information 2014 [place], [place].   ________________________________________________________________________

## 2015-02-11 ENCOUNTER — Encounter (HOSPITAL_COMMUNITY)
Admission: RE | Admit: 2015-02-11 | Discharge: 2015-02-11 | Disposition: A | Discharge status: Home / Self Care | Payer: Medicare Other | Source: Ambulatory Visit | Attending: Orthopedic Surgery | Admitting: Orthopedic Surgery

## 2015-02-11 ENCOUNTER — Encounter (HOSPITAL_COMMUNITY): Payer: Self-pay

## 2015-02-11 DIAGNOSIS — Z01812 Encounter for preprocedural laboratory examination: Secondary | ICD-10-CM | POA: Insufficient documentation

## 2015-02-11 DIAGNOSIS — M1611 Unilateral primary osteoarthritis, right hip: Secondary | ICD-10-CM | POA: Diagnosis not present

## 2015-02-11 HISTORY — DX: Depression, unspecified: F32.A

## 2015-02-11 HISTORY — DX: Anxiety disorder, unspecified: F41.9

## 2015-02-11 HISTORY — DX: Asymptomatic varicose veins of unspecified lower extremity: I83.90

## 2015-02-11 HISTORY — DX: Hypothyroidism, unspecified: E03.9

## 2015-02-11 HISTORY — DX: Crohn's disease, unspecified, without complications: K50.90

## 2015-02-11 HISTORY — DX: Pneumonia, unspecified organism: J18.9

## 2015-02-11 HISTORY — DX: Major depressive disorder, single episode, unspecified: F32.9

## 2015-02-11 HISTORY — DX: Age-related osteoporosis without current pathological fracture: M81.0

## 2015-02-11 HISTORY — DX: Essential (primary) hypertension: I10

## 2015-02-11 HISTORY — DX: Unspecified osteoarthritis, unspecified site: M19.90

## 2015-02-11 LAB — COMPREHENSIVE METABOLIC PANEL
ALT: 23 U/L (ref 0–35)
AST: 28 U/L (ref 0–37)
Albumin: 4.1 g/dL (ref 3.5–5.2)
Alkaline Phosphatase: 79 U/L (ref 39–117)
Anion gap: 9 (ref 5–15)
BILIRUBIN TOTAL: 0.6 mg/dL (ref 0.3–1.2)
BUN: 30 mg/dL — ABNORMAL HIGH (ref 6–23)
CALCIUM: 9.7 mg/dL (ref 8.4–10.5)
CO2: 26 mmol/L (ref 19–32)
Chloride: 103 mmol/L (ref 96–112)
Creatinine, Ser: 1.43 mg/dL — ABNORMAL HIGH (ref 0.50–1.10)
GFR calc Af Amer: 40 mL/min — ABNORMAL LOW (ref >90)
GFR calc non Af Amer: 34 mL/min — ABNORMAL LOW (ref >90)
GLUCOSE: 107 mg/dL — AB (ref 70–99)
Potassium: 5.6 mmol/L — ABNORMAL HIGH (ref 3.5–5.1)
SODIUM: 138 mmol/L (ref 135–145)
Total Protein: 7.3 g/dL (ref 6.0–8.3)

## 2015-02-11 LAB — CBC
HCT: 39.9 % (ref 36.0–46.0)
Hemoglobin: 13.2 g/dL (ref 12.0–15.0)
MCH: 33.8 pg (ref 26.0–34.0)
MCHC: 33.1 g/dL (ref 30.0–36.0)
MCV: 102.3 fL — AB (ref 78.0–100.0)
Platelets: 212 10*3/uL (ref 150–400)
RBC: 3.9 MIL/uL (ref 3.87–5.11)
RDW: 12.5 % (ref 11.5–15.5)
WBC: 10.1 10*3/uL (ref 4.0–10.5)

## 2015-02-11 LAB — APTT: APTT: 31 s (ref 24–37)

## 2015-02-11 LAB — ABO/RH: ABO/RH(D): O POS

## 2015-02-11 LAB — URINALYSIS, ROUTINE W REFLEX MICROSCOPIC
BILIRUBIN URINE: NEGATIVE
Glucose, UA: NEGATIVE mg/dL
Hgb urine dipstick: NEGATIVE
Ketones, ur: NEGATIVE mg/dL
Leukocytes, UA: NEGATIVE
Nitrite: NEGATIVE
Protein, ur: NEGATIVE mg/dL
Specific Gravity, Urine: 1.015 (ref 1.005–1.030)
UROBILINOGEN UA: 0.2 mg/dL (ref 0.0–1.0)
pH: 6 (ref 5.0–8.0)

## 2015-02-11 LAB — SURGICAL PCR SCREEN
MRSA, PCR: NEGATIVE
Staphylococcus aureus: POSITIVE — AB

## 2015-02-11 LAB — PROTIME-INR
INR: 0.98 (ref 0.00–1.49)
PROTHROMBIN TIME: 13.1 s (ref 11.6–15.2)

## 2015-02-11 NOTE — Progress Notes (Signed)
Faxed cmp to dr Wynelle Link via epic ekg 3/16 on chart with ov and clearance dr Helene Kelp

## 2015-02-14 NOTE — Progress Notes (Signed)
Late entry for 02/12/15--- received fax from dr Wynelle Link- no action regarding urine report

## 2015-02-18 ENCOUNTER — Ambulatory Visit: Payer: Self-pay | Admitting: Orthopedic Surgery

## 2015-02-18 NOTE — H&P (Signed)
Diana Ramsey. [place] DOB: 07/30/1935 Single / Language: Diana Ramsey / Race: White Female Date of Admission: Right Hip Pain History of Present Illness The patient is a 80 year old female who comes in for a preoperative History and Physical. The patient is scheduled for a right total hip arthroplasty (anterior approach) to be performed by Dr. Dione Plover. Aluisio, MD at [facility] on 02-19-2015. The patient is a 80 year old female who presents for follow up of their hip. The patient is being followed for their bilateral (right is worse at this point) osteoarthritis. They were initially seen as a second opinion. Symptoms reported include: pain, pain after sitting, aching, stiffness, pain with weightbearing and difficulty arising from chair. The pain is in her groin, lateral hip, radiating down to her anterior thigh. She states that she has had left hip pain in the past, but it does not bother her much now. She says it is getting harder to bend and do things that require bending. She is hurting at times at night. She is able to do most of what she desires with some limitations. The patient feels that they are doing poorly and report their pain level to be 6 / 10. Current treatment includes: activity modification and NSAIDs. The following medication has been used for pain control: antiinflammatory medication (Aleve prn). Unfortunately, her problems have gotten much worse compared to when she was initially seen six months ago. She now states that she is having pain at all times. It is limiting what she can and cannot do. She had an intraarticular injection over the summer which did not provide any benefit. Pain is in her groin and anterior thigh radiating towards her knee. It is worse with weightbearing but also occurring at rest. It is occurring at night. She states that she cannot really tolerate it anymore.  They have been treated conservatively in the past for the above stated problem and despite  conservative measures, they continue to have progressive pain and severe functional limitations and dysfunction. They have failed non-operative management including home exercise, medications, and injections. It is felt that they would benefit from undergoing total joint replacement. Risks and benefits of the procedure have been discussed with the patient and they elect to proceed with surgery. There are no active contraindications to surgery such as ongoing infection or rapidly progressive neurological disease.  Problem List/Past Medical Primary localized osteoarthritis of right hip (M16.11) Hypothyroidism High blood pressure Ulcerative Colitis frequent flareups Hypercholesterolemia Anxiety Disorder Depression Pneumonia Past History - Last bout October 2015 Varicose veins Osteoporosis Measles Mumps Peripheral Neuropathy Feet  Allergies  Penicillins Rash.  Family History  Liver Disease, Chronic Father. Cancer Father. Hypertension Father, Mother.  Social History  Living situation live alone Exercise Exercises weekly; does running / walking Current drinker 05/02/2014: Currently drinks wine only occasionally per week Children 1 Current work status retired Tobacco use Former smoker. 05/02/2014: smoke(d) less than 1/2 pack(s) per day Tobacco / smoke exposure 05/02/2014: no Marital status widowed Not under pain contract No history of drug/alcohol rehab Number of flights of stairs before winded 4-5 Post-Surgical Plans Home  Medication History  CeleBREX (200MG  Capsule, Oral daily) Active. Calcium Lactate (600MG  Tablet, Oral two times daily) Active. ALPRAZolam (0.5MG  Tablet, Oral two times daily) Active. Pravastatin Sodium (80MG  Tablet, Oral daily) Active. Boniva (150MG  Tablet, Oral monthly) Active. Mirtazapine (30MG  Tablet, Oral daily) Active. Metoprolol Tartrate (50MG  Tablet, Oral two times daily) Active. Sertraline HCl (100MG  Tablet,  Oral daily) Active. TraMADol HCl (50MG   Tablet, 2 Oral per day as needed) Active. Levothyroxine Sodium (50MCG Tablet, Oral daily) Active. Budesonide ER (3MG  Capsule ER 24HR, Oral daily) Active.  Past Surgical History Hysterectomy Date: 29. partial (non-cancerous)  Review of Systems General Not Present- Chills, Fatigue, Fever, Memory Loss, Night Sweats, Weight Gain and Weight Loss. Skin Not Present- Eczema, Hives, Itching, Lesions and Rash. HEENT Not Present- Dentures, Double Vision, Headache, Hearing Loss, Tinnitus and Visual Loss. Respiratory Not Present- Allergies, Chronic Cough, Coughing up blood, Shortness of breath at rest and Shortness of breath with exertion. Cardiovascular Not Present- Chest Pain, Difficulty Breathing Lying Down, Murmur, Palpitations, Racing/skipping heartbeats and Swelling. Gastrointestinal Not Present- Abdominal Pain, Bloody Stool, Constipation, Diarrhea, Difficulty Swallowing, Heartburn, Jaundice, Loss of appetitie, Nausea and Vomiting. Female Genitourinary Not Present- Blood in Urine, Discharge, Flank Pain, Incontinence, Painful Urination, Urgency, Urinary frequency, Urinary Retention, Urinating at Night and Weak urinary stream. Musculoskeletal Present- Joint Pain. Not Present- Back Pain, Joint Swelling, Morning Stiffness, Muscle Pain, Muscle Weakness and Spasms. Neurological Not Present- Blackout spells, Difficulty with balance, Dizziness, Paralysis, Tremor and Weakness. Psychiatric Not Present- Insomnia.  Vitals Weight: 142 lb Height: 62in Weight was reported by patient. Body Surface Area: 1.65 m Body Mass Index: 25.97 kg/m  BP: 162/78 (Sitting, Right Arm, Standard)   Physical Exam The physical exam findings are as follows: Note:Patietn is a 80 year old female with continued hip pain. Patient is accompanied today by her sister, Vaughan Basta.  General Mental Status -Alert, cooperative and good historian. General Appearance-pleasant, Not in  acute distress. Orientation-Oriented X3. Build & Nutrition-Well nourished and Well developed.  Head and Neck Head-normocephalic, atraumatic . Neck Global Assessment - supple, no bruit auscultated on the right, no bruit auscultated on the left.  Eye Pupil - Bilateral-Regular and Round. Motion - Bilateral-EOMI.  Chest and Lung Exam Auscultation Breath sounds - clear at anterior chest wall and clear at posterior chest wall. Adventitious sounds - No Adventitious sounds.  Cardiovascular Auscultation Rhythm - Regular rate and rhythm. Heart Sounds - S1 WNL and S2 WNL. Murmurs & Other Heart Sounds - Auscultation of the heart reveals - No Murmurs.  Abdomen Palpation/Percussion Tenderness - Abdomen is non-tender to palpation. Rigidity (guarding) - Abdomen is soft. Auscultation Auscultation of the abdomen reveals - Bowel sounds normal.  Female Genitourinary Note: Not done, not pertinent to present illness   Musculoskeletal Note: On exam, well developed female in no distress. Her left hip has normal motion with no discomfort. Her right hip flexion is about 100, rotation in 20 with significant pain, out 30 with significant pain and abduction 30 with significant pain. She has an antalgic gait pattern.  RADIOGRAPHS: We obtained new radiographs today. AP pelvis and lateral right hip show she still has joint space left superiorly but inferiorly has very large osteophytes especially noticed on the lateral view.  Assessment & Plan Primary localized osteoarthritis of right hip (M16.11) Note:Surgical Plans: Right Total Hip Repalcement - Anterior Approach  Disposition: Home  PCP: Dr. Kennith Maes - Patient has been seen preoperatively and felt to be stable for surgery. [facility]  IV TXA  Anesthesia Issues: None  Signed electronically by Joelene Millin, III PA-C

## 2015-02-19 ENCOUNTER — Encounter (HOSPITAL_COMMUNITY): Payer: Self-pay | Admitting: *Deleted

## 2015-02-19 ENCOUNTER — Inpatient Hospital Stay (HOSPITAL_COMMUNITY): Payer: Medicare Other | Admitting: Anesthesiology

## 2015-02-19 ENCOUNTER — Inpatient Hospital Stay (HOSPITAL_COMMUNITY): Payer: Medicare Other

## 2015-02-19 ENCOUNTER — Encounter (HOSPITAL_COMMUNITY)
Admission: RE | Disposition: A | Discharge status: Home Health | Payer: Self-pay | Source: Ambulatory Visit | Attending: Orthopedic Surgery

## 2015-02-19 ENCOUNTER — Inpatient Hospital Stay (HOSPITAL_COMMUNITY)
Admission: RE | Admit: 2015-02-19 | Discharge: 2015-02-24 | DRG: 470 | Disposition: A | Discharge status: Home Health | Payer: Medicare Other | Source: Ambulatory Visit | Attending: Orthopedic Surgery | Admitting: Orthopedic Surgery

## 2015-02-19 DIAGNOSIS — M1611 Unilateral primary osteoarthritis, right hip: Secondary | ICD-10-CM

## 2015-02-19 DIAGNOSIS — Z809 Family history of malignant neoplasm, unspecified: Secondary | ICD-10-CM | POA: Diagnosis not present

## 2015-02-19 DIAGNOSIS — I10 Essential (primary) hypertension: Secondary | ICD-10-CM | POA: Diagnosis not present

## 2015-02-19 DIAGNOSIS — Z96641 Presence of right artificial hip joint: Secondary | ICD-10-CM | POA: Diagnosis not present

## 2015-02-19 DIAGNOSIS — Z471 Aftercare following joint replacement surgery: Secondary | ICD-10-CM | POA: Diagnosis not present

## 2015-02-19 DIAGNOSIS — I8390 Asymptomatic varicose veins of unspecified lower extremity: Secondary | ICD-10-CM | POA: Diagnosis present

## 2015-02-19 DIAGNOSIS — Z79891 Long term (current) use of opiate analgesic: Secondary | ICD-10-CM | POA: Diagnosis not present

## 2015-02-19 DIAGNOSIS — Z96649 Presence of unspecified artificial hip joint: Secondary | ICD-10-CM

## 2015-02-19 DIAGNOSIS — F329 Major depressive disorder, single episode, unspecified: Secondary | ICD-10-CM | POA: Diagnosis present

## 2015-02-19 DIAGNOSIS — F419 Anxiety disorder, unspecified: Secondary | ICD-10-CM | POA: Diagnosis present

## 2015-02-19 DIAGNOSIS — Z01812 Encounter for preprocedural laboratory examination: Secondary | ICD-10-CM | POA: Diagnosis not present

## 2015-02-19 DIAGNOSIS — Z87891 Personal history of nicotine dependence: Secondary | ICD-10-CM

## 2015-02-19 DIAGNOSIS — E039 Hypothyroidism, unspecified: Secondary | ICD-10-CM | POA: Diagnosis present

## 2015-02-19 DIAGNOSIS — Z8249 Family history of ischemic heart disease and other diseases of the circulatory system: Secondary | ICD-10-CM | POA: Diagnosis not present

## 2015-02-19 DIAGNOSIS — Z880 Allergy status to penicillin: Secondary | ICD-10-CM

## 2015-02-19 DIAGNOSIS — M810 Age-related osteoporosis without current pathological fracture: Secondary | ICD-10-CM | POA: Diagnosis present

## 2015-02-19 DIAGNOSIS — K5090 Crohn's disease, unspecified, without complications: Secondary | ICD-10-CM | POA: Diagnosis not present

## 2015-02-19 DIAGNOSIS — E780 Pure hypercholesterolemia: Secondary | ICD-10-CM | POA: Diagnosis present

## 2015-02-19 DIAGNOSIS — M169 Osteoarthritis of hip, unspecified: Secondary | ICD-10-CM | POA: Diagnosis not present

## 2015-02-19 DIAGNOSIS — G629 Polyneuropathy, unspecified: Secondary | ICD-10-CM | POA: Diagnosis not present

## 2015-02-19 DIAGNOSIS — Z79899 Other long term (current) drug therapy: Secondary | ICD-10-CM | POA: Diagnosis not present

## 2015-02-19 DIAGNOSIS — M25751 Osteophyte, right hip: Secondary | ICD-10-CM | POA: Diagnosis present

## 2015-02-19 DIAGNOSIS — M25551 Pain in right hip: Secondary | ICD-10-CM | POA: Diagnosis not present

## 2015-02-19 HISTORY — PX: TOTAL HIP ARTHROPLASTY: SHX124

## 2015-02-19 LAB — TYPE AND SCREEN
ABO/RH(D): O POS
Antibody Screen: NEGATIVE

## 2015-02-19 SURGERY — ARTHROPLASTY, HIP, TOTAL, ANTERIOR APPROACH
Anesthesia: Spinal | Site: Hip | Laterality: Right

## 2015-02-19 MED ORDER — BUPIVACAINE HCL (PF) 0.25 % IJ SOLN
INTRAMUSCULAR | Status: AC
  Filled 2015-02-19: qty 30

## 2015-02-19 MED ORDER — METOPROLOL TARTRATE 50 MG PO TABS
50.0000 mg | ORAL_TABLET | Freq: Two times a day (BID) | ORAL | Status: DC
  Administered 2015-02-20 – 2015-02-24 (×4): 50 mg via ORAL
  Filled 2015-02-19 (×12): qty 1

## 2015-02-19 MED ORDER — MENTHOL 3 MG MT LOZG: 1.0000 | LOZENGE | OROMUCOSAL | Status: DC | PRN

## 2015-02-19 MED ORDER — LOPERAMIDE HCL 2 MG PO CAPS: 2.0000 mg | ORAL_CAPSULE | Freq: Four times a day (QID) | ORAL | Status: DC | PRN

## 2015-02-19 MED ORDER — HYDROMORPHONE HCL 1 MG/ML IJ SOLN
0.2500 mg | INTRAMUSCULAR | Status: DC | PRN
  Administered 2015-02-19 (×2): 0.5 mg via INTRAVENOUS

## 2015-02-19 MED ORDER — ACETAMINOPHEN 500 MG PO TABS
1000.0000 mg | ORAL_TABLET | Freq: Four times a day (QID) | ORAL | Status: AC
  Administered 2015-02-19 – 2015-02-20 (×4): 1000 mg via ORAL
  Filled 2015-02-19 (×5): qty 2

## 2015-02-19 MED ORDER — FENTANYL CITRATE 0.05 MG/ML IJ SOLN
INTRAMUSCULAR | Status: DC | PRN
  Administered 2015-02-19 (×2): 25 ug via INTRAVENOUS
  Administered 2015-02-19: 50 ug via INTRAVENOUS

## 2015-02-19 MED ORDER — PROMETHAZINE HCL 25 MG/ML IJ SOLN: 6.2500 mg | INTRAMUSCULAR | Status: DC | PRN

## 2015-02-19 MED ORDER — BUPIVACAINE LIPOSOME 1.3 % IJ SUSP
20.0000 mL | Freq: Once | INTRAMUSCULAR | Status: DC
  Filled 2015-02-19: qty 20

## 2015-02-19 MED ORDER — BUPIVACAINE-EPINEPHRINE (PF) 0.25% -1:200000 IJ SOLN
INTRAMUSCULAR | Status: AC
  Filled 2015-02-19: qty 30

## 2015-02-19 MED ORDER — APIXABAN 2.5 MG PO TABS
2.5000 mg | ORAL_TABLET | Freq: Two times a day (BID) | ORAL | Status: DC
  Administered 2015-02-20 – 2015-02-24 (×9): 2.5 mg via ORAL
  Filled 2015-02-19 (×10): qty 1

## 2015-02-19 MED ORDER — LACTATED RINGERS IV SOLN
INTRAVENOUS | Status: DC
  Administered 2015-02-19: 17:00:00 via INTRAVENOUS
  Administered 2015-02-19: 1000 mL via INTRAVENOUS

## 2015-02-19 MED ORDER — METOCLOPRAMIDE HCL 10 MG PO TABS: 5.0000 mg | ORAL_TABLET | Freq: Three times a day (TID) | ORAL | Status: DC | PRN

## 2015-02-19 MED ORDER — MIDAZOLAM HCL 2 MG/2ML IJ SOLN
INTRAMUSCULAR | Status: AC
  Filled 2015-02-19: qty 2

## 2015-02-19 MED ORDER — ONDANSETRON HCL 4 MG PO TABS: 4.0000 mg | ORAL_TABLET | Freq: Four times a day (QID) | ORAL | Status: DC | PRN

## 2015-02-19 MED ORDER — PROPOFOL 10 MG/ML IV BOLUS
INTRAVENOUS | Status: AC
  Filled 2015-02-19: qty 20

## 2015-02-19 MED ORDER — FENTANYL CITRATE 0.05 MG/ML IJ SOLN
INTRAMUSCULAR | Status: AC
  Filled 2015-02-19: qty 2

## 2015-02-19 MED ORDER — LIDOCAINE HCL (CARDIAC) 20 MG/ML IV SOLN
INTRAVENOUS | Status: AC
  Filled 2015-02-19: qty 5

## 2015-02-19 MED ORDER — LACTATED RINGERS IV SOLN: INTRAVENOUS | Status: DC

## 2015-02-19 MED ORDER — PHENYLEPHRINE HCL 10 MG/ML IJ SOLN
INTRAMUSCULAR | Status: DC | PRN
  Administered 2015-02-19 (×3): 80 ug via INTRAVENOUS

## 2015-02-19 MED ORDER — STERILE WATER FOR IRRIGATION IR SOLN
Status: DC | PRN
  Administered 2015-02-19: 1500 mL

## 2015-02-19 MED ORDER — SODIUM CHLORIDE 0.9 % IJ SOLN
INTRAMUSCULAR | Status: AC
  Filled 2015-02-19: qty 50

## 2015-02-19 MED ORDER — BUDESONIDE 3 MG PO CP24
3.0000 mg | ORAL_CAPSULE | Freq: Every morning | ORAL | Status: DC
  Administered 2015-02-20 – 2015-02-24 (×5): 3 mg via ORAL
  Filled 2015-02-19 (×5): qty 1

## 2015-02-19 MED ORDER — PHENYLEPHRINE 40 MCG/ML (10ML) SYRINGE FOR IV PUSH (FOR BLOOD PRESSURE SUPPORT)
PREFILLED_SYRINGE | INTRAVENOUS | Status: AC
  Filled 2015-02-19: qty 10

## 2015-02-19 MED ORDER — MEPERIDINE HCL 50 MG/ML IJ SOLN: 6.2500 mg | INTRAMUSCULAR | Status: DC | PRN

## 2015-02-19 MED ORDER — POLYETHYLENE GLYCOL 3350 17 G PO PACK
17.0000 g | PACK | Freq: Every day | ORAL | Status: DC | PRN
  Administered 2015-02-23 (×2): 17 g via ORAL
  Filled 2015-02-19 (×2): qty 1

## 2015-02-19 MED ORDER — DEXAMETHASONE SODIUM PHOSPHATE 10 MG/ML IJ SOLN
10.0000 mg | Freq: Once | INTRAMUSCULAR | Status: AC
  Administered 2015-02-19: 10 mg via INTRAVENOUS

## 2015-02-19 MED ORDER — DEXAMETHASONE SODIUM PHOSPHATE 10 MG/ML IJ SOLN
10.0000 mg | Freq: Once | INTRAMUSCULAR | Status: AC
  Administered 2015-02-20: 10 mg via INTRAVENOUS
  Filled 2015-02-19: qty 1

## 2015-02-19 MED ORDER — PHENOL 1.4 % MT LIQD
1.0000 | OROMUCOSAL | Status: DC | PRN
  Filled 2015-02-19: qty 177

## 2015-02-19 MED ORDER — FLEET ENEMA 7-19 GM/118ML RE ENEM: 1.0000 | ENEMA | Freq: Once | RECTAL | Status: AC | PRN

## 2015-02-19 MED ORDER — PHENYLEPHRINE HCL 10 MG/ML IJ SOLN
10.0000 mg | INTRAMUSCULAR | Status: DC | PRN
  Administered 2015-02-19: 50 ug/min via INTRAVENOUS

## 2015-02-19 MED ORDER — MIDAZOLAM HCL 5 MG/5ML IJ SOLN
INTRAMUSCULAR | Status: DC | PRN
  Administered 2015-02-19 (×2): 1 mg via INTRAVENOUS

## 2015-02-19 MED ORDER — HYDROMORPHONE HCL 1 MG/ML IJ SOLN
0.5000 mg | INTRAMUSCULAR | Status: DC | PRN
  Administered 2015-02-21: 0.5 mg via INTRAVENOUS
  Filled 2015-02-19: qty 1

## 2015-02-19 MED ORDER — METHOCARBAMOL 1000 MG/10ML IJ SOLN
500.0000 mg | Freq: Four times a day (QID) | INTRAVENOUS | Status: DC | PRN
  Filled 2015-02-19: qty 5

## 2015-02-19 MED ORDER — SERTRALINE HCL 100 MG PO TABS
100.0000 mg | ORAL_TABLET | Freq: Every morning | ORAL | Status: DC
  Administered 2015-02-20 – 2015-02-24 (×5): 100 mg via ORAL
  Filled 2015-02-19 (×5): qty 1

## 2015-02-19 MED ORDER — BUPIVACAINE HCL (PF) 0.25 % IJ SOLN
INTRAMUSCULAR | Status: DC | PRN
  Administered 2015-02-19: 30 mL

## 2015-02-19 MED ORDER — METHOCARBAMOL 500 MG PO TABS
500.0000 mg | ORAL_TABLET | Freq: Four times a day (QID) | ORAL | Status: DC | PRN
  Administered 2015-02-20 – 2015-02-23 (×8): 500 mg via ORAL
  Filled 2015-02-19 (×8): qty 1

## 2015-02-19 MED ORDER — ACETAMINOPHEN 325 MG PO TABS
650.0000 mg | ORAL_TABLET | Freq: Four times a day (QID) | ORAL | Status: DC | PRN
  Administered 2015-02-21 (×2): 325 mg via ORAL
  Administered 2015-02-23 (×2): 650 mg via ORAL
  Filled 2015-02-19 (×3): qty 2

## 2015-02-19 MED ORDER — LEVOTHYROXINE SODIUM 50 MCG PO TABS
50.0000 ug | ORAL_TABLET | Freq: Every day | ORAL | Status: DC
  Administered 2015-02-20 – 2015-02-24 (×5): 50 ug via ORAL
  Filled 2015-02-19 (×6): qty 1

## 2015-02-19 MED ORDER — ALPRAZOLAM 0.5 MG PO TABS
0.5000 mg | ORAL_TABLET | Freq: Two times a day (BID) | ORAL | Status: DC
  Administered 2015-02-19 – 2015-02-24 (×10): 0.5 mg via ORAL
  Filled 2015-02-19 (×10): qty 1

## 2015-02-19 MED ORDER — TEMAZEPAM 15 MG PO CAPS
30.0000 mg | ORAL_CAPSULE | Freq: Every day | ORAL | Status: DC
  Administered 2015-02-19 – 2015-02-21 (×3): 30 mg via ORAL
  Filled 2015-02-19 (×3): qty 2

## 2015-02-19 MED ORDER — CEFAZOLIN SODIUM-DEXTROSE 2-3 GM-% IV SOLR
2.0000 g | INTRAVENOUS | Status: AC
  Administered 2015-02-19: 2 g via INTRAVENOUS

## 2015-02-19 MED ORDER — 0.9 % SODIUM CHLORIDE (POUR BTL) OPTIME
TOPICAL | Status: DC | PRN
  Administered 2015-02-19: 1000 mL

## 2015-02-19 MED ORDER — TRANEXAMIC ACID 100 MG/ML IV SOLN
1000.0000 mg | INTRAVENOUS | Status: AC
  Administered 2015-02-19: 1000 mg via INTRAVENOUS
  Filled 2015-02-19: qty 10

## 2015-02-19 MED ORDER — BUPIVACAINE IN DEXTROSE 0.75-8.25 % IT SOLN
INTRATHECAL | Status: DC | PRN
  Administered 2015-02-19: 1.8 mL via INTRATHECAL

## 2015-02-19 MED ORDER — PROPOFOL INFUSION 10 MG/ML OPTIME
INTRAVENOUS | Status: DC | PRN
  Administered 2015-02-19: 75 ug/kg/min via INTRAVENOUS

## 2015-02-19 MED ORDER — FENTANYL CITRATE 0.05 MG/ML IJ SOLN
25.0000 ug | INTRAMUSCULAR | Status: DC | PRN
  Administered 2015-02-19 (×2): 50 ug via INTRAVENOUS

## 2015-02-19 MED ORDER — BISACODYL 10 MG RE SUPP: 10.0000 mg | Freq: Every day | RECTAL | Status: DC | PRN

## 2015-02-19 MED ORDER — SODIUM CHLORIDE 0.9 % IV SOLN
INTRAVENOUS | Status: DC
  Administered 2015-02-20: 02:00:00 via INTRAVENOUS

## 2015-02-19 MED ORDER — CEFAZOLIN SODIUM-DEXTROSE 2-3 GM-% IV SOLR
INTRAVENOUS | Status: AC
  Filled 2015-02-19: qty 50

## 2015-02-19 MED ORDER — PROPOFOL 10 MG/ML IV BOLUS
INTRAVENOUS | Status: DC | PRN
  Administered 2015-02-19: 30 mg via INTRAVENOUS

## 2015-02-19 MED ORDER — HYDROMORPHONE HCL 2 MG PO TABS
2.0000 mg | ORAL_TABLET | ORAL | Status: DC | PRN
  Administered 2015-02-20 – 2015-02-22 (×4): 2 mg via ORAL
  Filled 2015-02-19 (×4): qty 1

## 2015-02-19 MED ORDER — CEFAZOLIN SODIUM-DEXTROSE 2-3 GM-% IV SOLR
2.0000 g | Freq: Four times a day (QID) | INTRAVENOUS | Status: AC
  Administered 2015-02-19 – 2015-02-20 (×2): 2 g via INTRAVENOUS
  Filled 2015-02-19 (×2): qty 50

## 2015-02-19 MED ORDER — ONDANSETRON HCL 4 MG/2ML IJ SOLN
INTRAMUSCULAR | Status: DC | PRN
  Administered 2015-02-19: 4 mg via INTRAVENOUS

## 2015-02-19 MED ORDER — SODIUM CHLORIDE 0.9 % IV SOLN: INTRAVENOUS | Status: DC

## 2015-02-19 MED ORDER — ONDANSETRON HCL 4 MG/2ML IJ SOLN
INTRAMUSCULAR | Status: AC
  Filled 2015-02-19: qty 2

## 2015-02-19 MED ORDER — ACETAMINOPHEN 650 MG RE SUPP: 650.0000 mg | Freq: Four times a day (QID) | RECTAL | Status: DC | PRN

## 2015-02-19 MED ORDER — DIPHENHYDRAMINE HCL 12.5 MG/5ML PO ELIX: 12.5000 mg | ORAL_SOLUTION | ORAL | Status: DC | PRN

## 2015-02-19 MED ORDER — LIDOCAINE HCL (CARDIAC) 20 MG/ML IV SOLN
INTRAVENOUS | Status: DC | PRN
  Administered 2015-02-19: 30 mg via INTRAVENOUS

## 2015-02-19 MED ORDER — TRAMADOL HCL 50 MG PO TABS
50.0000 mg | ORAL_TABLET | Freq: Two times a day (BID) | ORAL | Status: DC | PRN
  Administered 2015-02-20 (×2): 50 mg via ORAL
  Administered 2015-02-21 – 2015-02-22 (×4): 100 mg via ORAL
  Administered 2015-02-23: 50 mg via ORAL
  Filled 2015-02-19: qty 1
  Filled 2015-02-19 (×3): qty 2
  Filled 2015-02-19 (×2): qty 1
  Filled 2015-02-19 (×2): qty 2

## 2015-02-19 MED ORDER — ACETAMINOPHEN 10 MG/ML IV SOLN
1000.0000 mg | Freq: Once | INTRAVENOUS | Status: AC
  Administered 2015-02-19: 1000 mg via INTRAVENOUS
  Filled 2015-02-19: qty 100

## 2015-02-19 MED ORDER — METOCLOPRAMIDE HCL 5 MG/ML IJ SOLN: 5.0000 mg | Freq: Three times a day (TID) | INTRAMUSCULAR | Status: DC | PRN

## 2015-02-19 MED ORDER — PHENYLEPHRINE HCL 10 MG/ML IJ SOLN
INTRAMUSCULAR | Status: AC
  Filled 2015-02-19: qty 1

## 2015-02-19 MED ORDER — HYDROMORPHONE HCL 1 MG/ML IJ SOLN
INTRAMUSCULAR | Status: AC
  Filled 2015-02-19: qty 1

## 2015-02-19 MED ORDER — DOCUSATE SODIUM 100 MG PO CAPS
100.0000 mg | ORAL_CAPSULE | Freq: Two times a day (BID) | ORAL | Status: DC
  Administered 2015-02-19 – 2015-02-23 (×9): 100 mg via ORAL

## 2015-02-19 MED ORDER — DEXAMETHASONE SODIUM PHOSPHATE 10 MG/ML IJ SOLN
INTRAMUSCULAR | Status: AC
  Filled 2015-02-19: qty 1

## 2015-02-19 MED ORDER — ONDANSETRON HCL 4 MG/2ML IJ SOLN: 4.0000 mg | Freq: Four times a day (QID) | INTRAMUSCULAR | Status: DC | PRN

## 2015-02-19 MED ORDER — EPHEDRINE SULFATE 50 MG/ML IJ SOLN
INTRAMUSCULAR | Status: DC | PRN
  Administered 2015-02-19: 10 mg via INTRAVENOUS

## 2015-02-19 SURGICAL SUPPLY — 41 items
BAG DECANTER FOR FLEXI CONT (MISCELLANEOUS) ×3 IMPLANT
BAG ZIPLOCK 12X15 (MISCELLANEOUS) IMPLANT
BLADE EXTENDED COATED 6.5IN (ELECTRODE) ×3 IMPLANT
BLADE SAG 18X100X1.27 (BLADE) ×3 IMPLANT
CAPT HIP TOTAL 2 ×3 IMPLANT
CLOSURE WOUND 1/2 X4 (GAUZE/BANDAGES/DRESSINGS) ×2
COVER PERINEAL POST (MISCELLANEOUS) ×3 IMPLANT
DECANTER SPIKE VIAL GLASS SM (MISCELLANEOUS) ×3 IMPLANT
DRAPE C-ARM 42X120 X-RAY (DRAPES) ×3 IMPLANT
DRAPE STERI IOBAN 125X83 (DRAPES) ×3 IMPLANT
DRAPE U-SHAPE 47X51 STRL (DRAPES) ×9 IMPLANT
DRSG ADAPTIC 3X8 NADH LF (GAUZE/BANDAGES/DRESSINGS) ×3 IMPLANT
DRSG MEPILEX BORDER 4X4 (GAUZE/BANDAGES/DRESSINGS) ×3 IMPLANT
DRSG MEPILEX BORDER 4X8 (GAUZE/BANDAGES/DRESSINGS) ×3 IMPLANT
DURAPREP 26ML APPLICATOR (WOUND CARE) ×3 IMPLANT
ELECT REM PT RETURN 9FT ADLT (ELECTROSURGICAL) ×3
ELECTRODE REM PT RTRN 9FT ADLT (ELECTROSURGICAL) ×1 IMPLANT
EVACUATOR 1/8 PVC DRAIN (DRAIN) ×3 IMPLANT
FACESHIELD WRAPAROUND (MASK) ×12 IMPLANT
GLOVE BIO SURGEON STRL SZ7.5 (GLOVE) ×3 IMPLANT
GLOVE BIO SURGEON STRL SZ8 (GLOVE) ×6 IMPLANT
GLOVE BIOGEL PI IND STRL 8 (GLOVE) ×2 IMPLANT
GLOVE BIOGEL PI INDICATOR 8 (GLOVE) ×4
GOWN STRL REUS W/TWL LRG LVL3 (GOWN DISPOSABLE) ×3 IMPLANT
GOWN STRL REUS W/TWL XL LVL3 (GOWN DISPOSABLE) ×3 IMPLANT
KIT BASIN OR (CUSTOM PROCEDURE TRAY) ×3 IMPLANT
NDL SAFETY ECLIPSE 18X1.5 (NEEDLE) ×1 IMPLANT
NEEDLE HYPO 18GX1.5 SHARP (NEEDLE) ×2
PACK TOTAL JOINT (CUSTOM PROCEDURE TRAY) ×3 IMPLANT
PEN SKIN MARKING BROAD (MISCELLANEOUS) ×3 IMPLANT
STRIP CLOSURE SKIN 1/2X4 (GAUZE/BANDAGES/DRESSINGS) ×4 IMPLANT
SUT ETHIBOND NAB CT1 #1 30IN (SUTURE) ×3 IMPLANT
SUT MNCRL AB 4-0 PS2 18 (SUTURE) ×3 IMPLANT
SUT VIC AB 2-0 CT1 27 (SUTURE) ×6
SUT VIC AB 2-0 CT1 TAPERPNT 27 (SUTURE) ×3 IMPLANT
SUT VLOC 180 0 24IN GS25 (SUTURE) ×3 IMPLANT
SYR 30ML LL (SYRINGE) ×3 IMPLANT
SYR 50ML LL SCALE MARK (SYRINGE) IMPLANT
TOWEL OR 17X26 10 PK STRL BLUE (TOWEL DISPOSABLE) ×3 IMPLANT
TOWEL OR NON WOVEN STRL DISP B (DISPOSABLE) IMPLANT
TRAY FOLEY CATH 14FRSI W/METER (CATHETERS) ×3 IMPLANT

## 2015-02-19 NOTE — Anesthesia Procedure Notes (Signed)
Spinal Patient location during procedure: OR Start time: 02/19/2015 3:43 PM End time: 02/19/2015 3:46 PM Staffing Anesthesiologist: Montez Hageman Resident/CRNA: Lajuana Carry E Performed by: resident/CRNA  Preanesthetic Checklist Completed: patient identified, site marked, surgical consent, pre-op evaluation, timeout performed, IV checked, risks and benefits discussed and monitors and equipment checked Spinal Block Patient position: sitting Prep: Betadine Patient monitoring: heart rate, continuous pulse ox and blood pressure Approach: right paramedian Location: L3-4 Injection technique: single-shot Needle Needle type: Spinocan  Needle gauge: 22 G Needle length: 9 cm Assessment Sensory level: T6 Additional Notes Expiration date of kit checked and confirmed. Attempt midline w/o success, 2nd attempt right paramedian with clear CSF, neg heme, neg paresthesia Patient tolerated procedure well, without complications. Return to supine.

## 2015-02-19 NOTE — Anesthesia Preprocedure Evaluation (Signed)
Anesthesia Evaluation  Patient identified by MRN, date of birth, ID band Patient awake    Reviewed: Allergy & Precautions, NPO status , Patient's Chart, lab work & pertinent test results  Airway Mallampati: II  TM Distance: >3 FB Neck ROM: Full    Dental no notable dental hx. (+) Implants   Pulmonary neg pulmonary ROS, former smoker,  breath sounds clear to auscultation  Pulmonary exam normal       Cardiovascular hypertension, Pt. on medications Rhythm:Regular Rate:Normal     Neuro/Psych negative neurological ROS  negative psych ROS   GI/Hepatic negative GI ROS, Neg liver ROS,   Endo/Other  Hypothyroidism   Renal/GU negative Renal ROS  negative genitourinary   Musculoskeletal negative musculoskeletal ROS (+)   Abdominal   Peds negative pediatric ROS (+)  Hematology negative hematology ROS (+)   Anesthesia Other Findings   Reproductive/Obstetrics negative OB ROS                             Anesthesia Physical Anesthesia Plan  ASA: II  Anesthesia Plan: Spinal   Post-op Pain Management:    Induction: Intravenous  Airway Management Planned: Simple Face Mask  Additional Equipment:   Intra-op Plan:   Post-operative Plan:   Informed Consent: I have reviewed the patients History and Physical, chart, labs and discussed the procedure including the risks, benefits and alternatives for the proposed anesthesia with the patient or authorized representative who has indicated his/her understanding and acceptance.   Dental advisory given  Plan Discussed with: CRNA  Anesthesia Plan Comments:         Anesthesia Quick Evaluation

## 2015-02-19 NOTE — Transfer of Care (Signed)
Immediate Anesthesia Transfer of Care Note  Patient: Diana Ramsey  Procedure(s) Performed: Procedure(s) (LRB): RIGHT TOTAL HIP ARTHROPLASTY ANTERIOR APPROACH (Right)  Patient Location: PACU  Anesthesia Type: Spinal  Level of Consciousness: sedated, patient cooperative and responds to stimulation  Airway & Oxygen Therapy: Patient Spontanous Breathing and Patient connected to face mask oxgen  Post-op Assessment: Report given to PACU RN and Post -op Vital signs reviewed and stable  Post vital signs: Reviewed and stable  Complications: No apparent anesthesia complications

## 2015-02-19 NOTE — Op Note (Signed)
OPERATIVE REPORT  PREOPERATIVE DIAGNOSIS: Osteoarthritis of the Right hip.   POSTOPERATIVE DIAGNOSIS: Osteoarthritis of the Right  hip.   PROCEDURE: Right total hip arthroplasty, anterior approach.   SURGEON: Gaynelle Arabian, MD   ASSISTANT: Arlee Muslim, PA-C  ANESTHESIA:  Spinal  ESTIMATED BLOOD LOSS:-500 ml   DRAINS: Hemovac x1.   COMPLICATIONS: None   CONDITION: PACU - hemodynamically stable.   BRIEF CLINICAL NOTE: Diana Ramsey is a 80 y.o. female who has advanced   arthritis of her Right  hip with progressively worsening pain and  dysfunction.The patient has failed nonoperative management and presents for  total hip arthroplasty.   PROCEDURE IN DETAIL: After successful administration of spinal  anesthetic, the traction boots for the [facility] bed were placed on both  feet and the patient was placed onto the [facility] bed, boots placed into the leg  holders. The Right hip was then isolated from the perineum with plastic  drapes and prepped and draped in the usual sterile fashion. ASIS and  greater trochanter were marked and a oblique incision was made, starting  at about 1 cm lateral and 2 cm distal to the ASIS and coursing towards  the anterior cortex of the femur. The skin was cut with a 10 blade  through subcutaneous tissue to the level of the fascia overlying the  tensor fascia lata muscle. The fascia was then incised in line with the  incision at the junction of the anterior third and posterior 2/3rd. The  muscle was teased off the fascia and then the interval between the TFL  and the rectus was developed. The Hohmann retractor was then placed at  the top of the femoral neck over the capsule. The vessels overlying the  capsule were cauterized and the fat on top of the capsule was removed.  A Hohmann retractor was then placed anterior underneath the rectus  femoris to give exposure to the entire anterior capsule. A T-shaped  capsulotomy was performed. The edges  were tagged and the femoral head  was identified.       Osteophytes are removed off the superior acetabulum.  The femoral neck was then cut in situ with an oscillating saw. Traction  was then applied to the left lower extremity utilizing the [facility]  traction. The femoral head was then removed. Retractors were placed  around the acetabulum and then circumferential removal of the labrum was  performed. Osteophytes were also removed. Reaming starts at 43 mm to  medialize and  Increased in 2 mm increments to 47 mm. We reamed in  approximately 40 degrees of abduction, 20 degrees anteversion. A 48 mm  pinnacle acetabular shell was then impacted in anatomic position under  fluoroscopic guidance with excellent purchase. We did not need to place  any additional dome screws. A 28 mm neutral + 4 marathon liner was then  placed into the acetabular shell.       The femoral lift was then placed along the lateral aspect of the femur  just distal to the vastus ridge. The leg was  externally rotated and capsule  was stripped off the inferior aspect of the femoral neck down to the  level of the lesser trochanter, this was done with electrocautery. The femur was lifted after this was performed. The  leg was then placed and extended in adducted position to essentially delivering the femur. We also removed the capsule superiorly and the  piriformis from the piriformis fossa  to gain excellent exposure of the  proximal femur. Rongeur was used to remove some cancellous bone to get  into the lateral portion of the proximal femur for placement of the  initial starter reamer. The starter broaches was placed  the starter broach  and was shown to go down the center of the canal. Broaching  with the  Corail system was then performed starting at size 8, coursing  Up to size 11. A size 11 had excellent torsional and rotational  and axial stability. The trial high offset neck was then placed  with a 28 + 1.5 trial head. The  hip was then reduced. We confirmed that  the stem was in the canal both on AP and lateral x-rays. It also has excellent sizing. The hip was reduced with outstanding stability through full extension, full external rotation,  and then flexion in adduction internal rotation. AP pelvis was taken  and the leg lengths were measured and found to be exactly equal. Hip  was then dislocated again and the femoral head and neck removed. The  femoral broach was removed. Size 11 Corail stem with a high offset  neck was then impacted into the femur following native anteversion. Has  excellent purchase in the canal. Excellent torsional and rotational and  axial stability. It is confirmed to be in the canal on AP and lateral  fluoroscopic views. The 28 + 1.5 ceramic head was placed and the hip  reduced with outstanding stability. Again AP pelvis was taken and it  confirmed that the leg lengths were equal. The wound was then copiously  irrigated with saline solution and the capsule reattached and repaired  with Ethibond suture.  20 mL of Exparel mixed with 50 mL of saline then additional 20 ml of .25% Bupivicaine injected into the capsule and into the edge of the tensor fascia lata as well as subcutaneous tissue. The fascia overlying the tensor fascia lata was  then closed with a running #1 V-Loc. Subcu was closed with interrupted  2-0 Vicryl and subcuticular running 4-0 Monocryl. Incision was cleaned  and dried. Steri-Strips and a bulky sterile dressing applied. Hemovac  drain was hooked to suction and then he was awakened and transported to  recovery in stable condition.        Please note that a surgical assistant was a medical necessity for this procedure to perform it in a safe and expeditious manner. Assistant was necessary to provide appropriate retraction of vital neurovascular structures and to prevent femoral fracture and allow for anatomic placement of the prosthesis.  Gaynelle Arabian, M.D.

## 2015-02-19 NOTE — Interval H&P Note (Signed)
History and Physical Interval Note:  02/19/2015 3:00 PM  Diana Ramsey  has presented today for surgery, with the diagnosis of OA OF RIGHT HIP  The various methods of treatment have been discussed with the patient and family. After consideration of risks, benefits and other options for treatment, the patient has consented to  Procedure(s): RIGHT TOTAL HIP ARTHROPLASTY ANTERIOR APPROACH (Right) as a surgical intervention .  The patient's history has been reviewed, patient examined, no change in status, stable for surgery.  I have reviewed the patient's chart and labs.  Questions were answered to the patient's satisfaction.     Gearlean Alf

## 2015-02-19 NOTE — H&P (View-Only) (Signed)
Diana Ramsey. Rump DOB: Oct 30, 1935 Single / Language: Diana Ramsey / Race: White Female Date of Admission: Right Hip Pain History of Present Illness The patient is a 80 year old female who comes in for a preoperative History and Physical. The patient is scheduled for a right total hip arthroplasty (anterior approach) to be performed by Dr. Dione Ramsey. Aluisio, MD at [facility] on 02-19-2015. The patient is a 80 year old female who presents for follow up of their hip. The patient is being followed for their bilateral (right is worse at this point) osteoarthritis. They were initially seen as a second opinion. Symptoms reported include: pain, pain after sitting, aching, stiffness, pain with weightbearing and difficulty arising from chair. The pain is in her groin, lateral hip, radiating down to her anterior thigh. She states that she has had left hip pain in the past, but it does not bother her much now. She says it is getting harder to bend and do things that require bending. She is hurting at times at night. She is able to do most of what she desires with some limitations. The patient feels that they are doing poorly and report their pain level to be 6 / 10. Current treatment includes: activity modification and NSAIDs. The following medication has been used for pain control: antiinflammatory medication (Aleve prn). Unfortunately, her problems have gotten much worse compared to when she was initially seen six months ago. She now states that she is having pain at all times. It is limiting what she can and cannot do. She had an intraarticular injection over the summer which did not provide any benefit. Pain is in her groin and anterior thigh radiating towards her knee. It is worse with weightbearing but also occurring at rest. It is occurring at night. She states that she cannot really tolerate it anymore.  They have been treated conservatively in the past for the above stated problem and despite  conservative measures, they continue to have progressive pain and severe functional limitations and dysfunction. They have failed non-operative management including home exercise, medications, and injections. It is felt that they would benefit from undergoing total joint replacement. Risks and benefits of the procedure have been discussed with the patient and they elect to proceed with surgery. There are no active contraindications to surgery such as ongoing infection or rapidly progressive neurological disease.  Problem List/Past Medical Primary localized osteoarthritis of right hip (M16.11) Hypothyroidism High blood pressure Ulcerative Colitis frequent flareups Hypercholesterolemia Anxiety Disorder Depression Pneumonia Past History - Last bout October 2015 Varicose veins Osteoporosis Measles Mumps Peripheral Neuropathy Feet  Allergies  Penicillins Rash.  Family History  Liver Disease, Chronic Father. Cancer Father. Hypertension Father, Mother.  Social History  Living situation live alone Exercise Exercises weekly; does running / walking Current drinker 05/02/2014: Currently drinks wine only occasionally per week Children 1 Current work status retired Tobacco use Former smoker. 05/02/2014: smoke(d) less than 1/2 pack(s) per day Tobacco / smoke exposure 05/02/2014: no Marital status widowed Not under pain contract No history of drug/alcohol rehab Number of flights of stairs before winded 4-5 Post-Surgical Plans Home  Medication History  CeleBREX (200MG  Capsule, Oral daily) Active. Calcium Lactate (600MG  Tablet, Oral two times daily) Active. ALPRAZolam (0.5MG  Tablet, Oral two times daily) Active. Pravastatin Sodium (80MG  Tablet, Oral daily) Active. Boniva (150MG  Tablet, Oral monthly) Active. Mirtazapine (30MG  Tablet, Oral daily) Active. Metoprolol Tartrate (50MG  Tablet, Oral two times daily) Active. Sertraline HCl (100MG  Tablet,  Oral daily) Active. TraMADol HCl (50MG   Tablet, 2 Oral per day as needed) Active. Levothyroxine Sodium (50MCG Tablet, Oral daily) Active. Budesonide ER (3MG  Capsule ER 24HR, Oral daily) Active.  Past Surgical History Hysterectomy Date: 53. partial (non-cancerous)  Review of Systems General Not Present- Chills, Fatigue, Fever, Memory Loss, Night Sweats, Weight Gain and Weight Loss. Skin Not Present- Eczema, Hives, Itching, Lesions and Rash. HEENT Not Present- Dentures, Double Vision, Headache, Hearing Loss, Tinnitus and Visual Loss. Respiratory Not Present- Allergies, Chronic Cough, Coughing up blood, Shortness of breath at rest and Shortness of breath with exertion. Cardiovascular Not Present- Chest Pain, Difficulty Breathing Lying Down, Murmur, Palpitations, Racing/skipping heartbeats and Swelling. Gastrointestinal Not Present- Abdominal Pain, Bloody Stool, Constipation, Diarrhea, Difficulty Swallowing, Heartburn, Jaundice, Loss of appetitie, Nausea and Vomiting. Female Genitourinary Not Present- Blood in Urine, Discharge, Flank Pain, Incontinence, Painful Urination, Urgency, Urinary frequency, Urinary Retention, Urinating at Night and Weak urinary stream. Musculoskeletal Present- Joint Pain. Not Present- Back Pain, Joint Swelling, Morning Stiffness, Muscle Pain, Muscle Weakness and Spasms. Neurological Not Present- Blackout spells, Difficulty with balance, Dizziness, Paralysis, Tremor and Weakness. Psychiatric Not Present- Insomnia.  Vitals Weight: 142 lb Height: 62in Weight was reported by patient. Body Surface Area: 1.65 m Body Mass Index: 25.97 kg/m  BP: 162/78 (Sitting, Right Arm, Standard)   Physical Exam The physical exam findings are as follows: Note:Patietn is a 80 year old female with continued hip pain. Patient is accompanied today by her sister, Diana Ramsey.  General Mental Status -Alert, cooperative and good historian. General Appearance-pleasant, Not in  acute distress. Orientation-Oriented X3. Build & Nutrition-Well nourished and Well developed.  Head and Neck Head-normocephalic, atraumatic . Neck Global Assessment - supple, no bruit auscultated on the right, no bruit auscultated on the left.  Eye Pupil - Bilateral-Regular and Round. Motion - Bilateral-EOMI.  Chest and Lung Exam Auscultation Breath sounds - clear at anterior chest wall and clear at posterior chest wall. Adventitious sounds - No Adventitious sounds.  Cardiovascular Auscultation Rhythm - Regular rate and rhythm. Heart Sounds - S1 WNL and S2 WNL. Murmurs & Other Heart Sounds - Auscultation of the heart reveals - No Murmurs.  Abdomen Palpation/Percussion Tenderness - Abdomen is non-tender to palpation. Rigidity (guarding) - Abdomen is soft. Auscultation Auscultation of the abdomen reveals - Bowel sounds normal.  Female Genitourinary Note: Not done, not pertinent to present illness   Musculoskeletal Note: On exam, well developed female in no distress. Her left hip has normal motion with no discomfort. Her right hip flexion is about 100, rotation in 20 with significant pain, out 30 with significant pain and abduction 30 with significant pain. She has an antalgic gait pattern.  RADIOGRAPHS: We obtained new radiographs today. AP pelvis and lateral right hip show she still has joint space left superiorly but inferiorly has very large osteophytes especially noticed on the lateral view.  Assessment & Plan Primary localized osteoarthritis of right hip (M16.11) Note:Surgical Plans: Right Total Hip Repalcement - Anterior Approach  Disposition: Home  PCP: Dr. Kennith Maes - Patient has been seen preoperatively and felt to be stable for surgery. [facility]  IV TXA  Anesthesia Issues: None  Signed electronically by Joelene Millin, III PA-C

## 2015-02-20 ENCOUNTER — Encounter (HOSPITAL_COMMUNITY): Payer: Self-pay | Admitting: Orthopedic Surgery

## 2015-02-20 LAB — BASIC METABOLIC PANEL
ANION GAP: 7 (ref 5–15)
BUN: 19 mg/dL (ref 6–23)
CHLORIDE: 106 mmol/L (ref 96–112)
CO2: 25 mmol/L (ref 19–32)
Calcium: 8.8 mg/dL (ref 8.4–10.5)
Creatinine, Ser: 1.18 mg/dL — ABNORMAL HIGH (ref 0.50–1.10)
GFR calc non Af Amer: 43 mL/min — ABNORMAL LOW (ref >90)
GFR, EST AFRICAN AMERICAN: 50 mL/min — AB (ref >90)
Glucose, Bld: 144 mg/dL — ABNORMAL HIGH (ref 70–99)
POTASSIUM: 4.5 mmol/L (ref 3.5–5.1)
Sodium: 138 mmol/L (ref 135–145)

## 2015-02-20 LAB — CBC
HEMATOCRIT: 28.8 % — AB (ref 36.0–46.0)
Hemoglobin: 9.7 g/dL — ABNORMAL LOW (ref 12.0–15.0)
MCH: 34.2 pg — AB (ref 26.0–34.0)
MCHC: 33.7 g/dL (ref 30.0–36.0)
MCV: 101.4 fL — AB (ref 78.0–100.0)
PLATELETS: 151 10*3/uL (ref 150–400)
RBC: 2.84 MIL/uL — ABNORMAL LOW (ref 3.87–5.11)
RDW: 12.5 % (ref 11.5–15.5)
WBC: 9.7 10*3/uL (ref 4.0–10.5)

## 2015-02-20 NOTE — Discharge Instructions (Addendum)
Dr. Gaynelle Arabian Total Joint Specialist [facility] [address] [place], [place] [address] [phone]  ANTERIOR APPROACH TOTAL HIP REPLACEMENT POSTOPERATIVE DIRECTIONS   Hip Rehabilitation, Guidelines Following Surgery  The results of a hip operation are greatly improved after range of motion and muscle strengthening exercises. Follow all safety measures which are given to protect your hip. If any of these exercises cause increased pain or swelling in your joint, decrease the amount until you are comfortable again. Then slowly increase the exercises. Call your caregiver if you have problems or questions.   HOME CARE INSTRUCTIONS  Remove items at home which could result in a fall. This includes throw rugs or furniture in walking pathways.   ICE to the affected hip every three hours for 30 minutes at a time and then as needed for pain and swelling.  Continue to use ice on the hip for pain and swelling from surgery. You may notice swelling that will progress down to the foot and ankle.  This is normal after surgery.  Elevate the leg when you are not up walking on it.    Continue to use the breathing machine which will help keep your temperature down.  It is common for your temperature to cycle up and down following surgery, especially at night when you are not up moving around and exerting yourself.  The breathing machine keeps your lungs expanded and your temperature down.  Do not place pillow under knee, focus on keeping the knee straight while resting  DIET You may resume your previous home diet once your are discharged from the hospital.  DRESSING / WOUND CARE / SHOWERING You may change your dressing 3-5 days after surgery.  Then change the dressing every day with sterile gauze.  Please use good hand washing techniques before changing the dressing.  Do not use any lotions or creams on the incision until instructed by your surgeon. You may start showering  once you are discharged home but do not submerge the incision under water. Just pat the incision dry and apply a dry gauze dressing on daily. Change the surgical dressing daily and reapply a dry dressing each time.  ACTIVITY Walk with your walker as instructed. Use walker as long as suggested by your caregivers. Avoid periods of inactivity such as sitting longer than an hour when not asleep. This helps prevent blood clots.  You may resume a sexual relationship in one month or when given the OK by your doctor.  You may return to work once you are cleared by your doctor.  Do not drive a car for 6 weeks or until released by you surgeon.  Do not drive while taking narcotics.  WEIGHT BEARING Weight bearing as tolerated with assist device (walker, cane, etc) as directed, use it as long as suggested by your surgeon or therapist, typically at least 4-6 weeks.  POSTOPERATIVE CONSTIPATION PROTOCOL Constipation - defined medically as fewer than three stools per week and severe constipation as less than one stool per week.  One of the most common issues patients have following surgery is constipation.  Even if you have a regular bowel pattern at home, your normal regimen is likely to be disrupted due to multiple reasons following surgery.  Combination of anesthesia, postoperative narcotics, change in appetite and fluid intake all can affect your bowels.  In order to avoid complications following surgery, here are some recommendations in order to help you during your recovery period.  Colace (docusate) - Pick  up an over-the-counter form of Colace or another stool softener and take twice a day as long as you are requiring postoperative pain medications.  Take with a full glass of water daily.  If you experience loose stools or diarrhea, hold the colace until you stool forms back up.  If your symptoms do not get better within 1 week or if they get worse, check with your doctor. ° °Dulcolax (bisacodyl) - Pick up  over-the-counter and take as directed by the product packaging as needed to assist with the movement of your bowels.  Take with a full glass of water.  Use this product as needed if not relieved by Colace only.  ° °MiraLax (polyethylene glycol) - Pick up over-the-counter to have on hand.  MiraLax is a solution that will increase the amount of water in your bowels to assist with bowel movements.  Take as directed and can mix with a glass of water, juice, soda, coffee, or tea.  Take if you go more than two days without a movement. °Do not use MiraLax more than once per day. Call your doctor if you are still constipated or irregular after using this medication for 7 days in a row. ° °If you continue to have problems with postoperative constipation, please contact the office for further assistance and recommendations.  If you experience "the worst abdominal pain ever" or develop nausea or vomiting, please contact the office immediatly for further recommendations for treatment. ° °ITCHING ° If you experience itching with your medications, try taking only a single pain pill, or even half a pain pill at a time.  You can also use Benadryl over the counter for itching or also to help with sleep.  ° °TED HOSE STOCKINGS °Wear the elastic stockings on both legs for three weeks following surgery during the day but you may remove then at night for sleeping. ° °MEDICATIONS °See your medication summary on the “After Visit Summary” that the nursing staff will review with you prior to discharge.  You may have some home medications which will be placed on hold until you complete the course of blood thinner medication.  It is important for you to complete the blood thinner medication as prescribed by your surgeon.  Continue your approved medications as instructed at time of discharge. ° °PRECAUTIONS °If you experience chest pain or shortness of breath - call 911 immediately for transfer to the hospital emergency department.  °If you  develop a fever greater that 101 F, purulent drainage from wound, increased redness or drainage from wound, foul odor from the wound/dressing, or calf pain - CONTACT YOUR SURGEON.   °                                                °FOLLOW-UP APPOINTMENTS °Make sure you keep all of your appointments after your operation with your surgeon and caregivers. You should call the office at the above phone number and make an appointment for approximately two weeks after the date of your surgery or on the date instructed by your surgeon outlined in the "After Visit Summary". ° °RANGE OF MOTION AND STRENGTHENING EXERCISES  °These exercises are designed to help you keep full movement of your hip joint. Follow your caregiver's or physical therapist's instructions. Perform all exercises about fifteen times, three times per day or as directed. Exercise both hips,   even if you have had only one joint replacement. These exercises can be done on a training (exercise) mat, on the floor, on a table or on a bed. Use whatever works the best and is most comfortable for you. Use music or television while you are exercising so that the exercises are a pleasant break in your day. This will make your life better with the exercises acting as a break in routine you can look forward to.  Lying on your back, slowly slide your foot toward your buttocks, raising your knee up off the floor. Then slowly slide your foot back down until your leg is straight again.  Lying on your back spread your legs as far apart as you can without causing discomfort.  Lying on your side, raise your upper leg and foot straight up from the floor as far as is comfortable. Slowly lower the leg and repeat.  Lying on your back, tighten up the muscle in the front of your thigh (quadriceps muscles). You can do this by keeping your leg straight and trying to raise your heel off the floor. This helps strengthen the largest muscle supporting your knee.  Lying on your back,  tighten up the muscles of your buttocks both with the legs straight and with the knee bent at a comfortable angle while keeping your heel on the floor.   IF YOU ARE TRANSFERRED TO A SKILLED REHAB FACILITY If the patient is transferred to a skilled rehab facility following release from the hospital, a list of the current medications will be sent to the facility for the patient to continue.  When discharged from the skilled rehab facility, please have the facility set up the patient's [place] prior to being released. Also, the skilled facility will be responsible for providing the patient with their medications at time of release from the facility to include their pain medication, the muscle relaxants, and their blood thinner medication. If the patient is still at the rehab facility at time of the two week follow up appointment, the skilled rehab facility will also need to assist the patient in arranging follow up appointment in our office and any transportation needs.  MAKE SURE YOU:  Understand these instructions.  Get help right away if you are not doing well or get worse.    Pick up stool softner and laxative for home use following surgery while on pain medications. Do not submerge incision under water. Please use good hand washing techniques while changing dressing each day. May shower starting three days after surgery. Please use a clean towel to pat the incision dry following showers. Continue to use ice for pain and swelling after surgery. Do not use any lotions or creams on the incision until instructed by your surgeon.  Take Eliquis for two and a half more weeks, then discontinue Eliquis. Once the patient has completed the blood thinner regimen, then take a Baby 81 mg Aspirin daily for three more weeks.    Information on my medicine - ELIQUIS (apixaban)  This medication education was reviewed with me or my healthcare representative as part of my discharge  preparation.  The pharmacist that spoke with me during my hospital stay was:  Angela Adam [facility]  Why was Eliquis prescribed for you? Eliquis was prescribed for you to reduce the risk of blood clots forming after orthopedic surgery.    What do You need to know about Eliquis? Take your Eliquis TWICE DAILY - one tablet in the  morning and one tablet in the evening with or without food.  It would be best to take the dose about the same time each day.  If you have difficulty swallowing the tablet whole please discuss with your pharmacist how to take the medication safely.  Take Eliquis exactly as prescribed by your doctor and DO NOT stop taking Eliquis without talking to the doctor who prescribed the medication.  Stopping without other medication to take the place of Eliquis may increase your risk of developing a clot.  After discharge, you should have regular check-up appointments with your healthcare provider that is prescribing your Eliquis.  What do you do if you miss a dose? If a dose of ELIQUIS is not taken at the scheduled time, take it as soon as possible on the same day and twice-daily administration should be resumed.  The dose should not be doubled to make up for a missed dose.  Do not take more than one tablet of ELIQUIS at the same time.  Important Safety Information A possible side effect of Eliquis is bleeding. You should call your healthcare provider right away if you experience any of the following: ? Bleeding from an injury or your nose that does not stop. ? Unusual colored urine (red or dark brown) or unusual colored stools (red or black). ? Unusual bruising for unknown reasons. ? A serious fall or if you hit your head (even if there is no bleeding).  Some medicines may interact with Eliquis and might increase your risk of bleeding or clotting while on Eliquis. To help avoid this, consult your healthcare provider or pharmacist prior to using any new prescription  or non-prescription medications, including herbals, vitamins, non-steroidal anti-inflammatory drugs (NSAIDs) and supplements.  This website has more information on Eliquis (apixaban): http://www.eliquis.com/eliquis/home

## 2015-02-20 NOTE — Care Management Note (Signed)
    Page 1 of 2   02/20/2015     1:36:47 PM CARE MANAGEMENT NOTE 02/20/2015  Patient:  JEFF, MCCALLUM   Account Number:  [id number]  Date Initiated:  02/20/2015  Documentation initiated by:  [facility]  Subjective/Objective Assessment:   adm: RIGHT TOTAL HIP ARTHROPLASTY ANTERIOR APPROACH (Right)     Action/Plan:   discharge planning   Anticipated DC Date:  02/21/2015   Anticipated DC Plan:  [employer]  CM consult      [facility] Choice  HOME HEALTH   Choice offered to / List presented to:  C-1 Patient   DME arranged  3-N-1  Vassie Moselle      DME agency  [place] arranged  [place]   Status of service:  Completed, signed off Medicare Important Message given?   (If response is "NO", the following Medicare IM given date fields will be blank) Date Medicare IM given:   Medicare IM given by:   Date Additional Medicare IM given:   Additional Medicare IM given by:    Discharge Disposition:  [place]  Per UR Regulation:    If discussed at Long Length of Stay Meetings, dates discussed:    Comments:  02/20/15 10:30 Cm met with pt in room to offer choice of home health agency.  Pt chooses [facility] to render HHPT.  Address and contact information verified by  pt.  Referral emailed to [employer], Tim.  CM called [facility] DME rep, Lecretia to please deliver the 3n1 and rolling walker to room.  No other CM needs were communicated.  Mariane Masters, BSN, Cm [phone].

## 2015-02-20 NOTE — Progress Notes (Signed)
   Subjective: 1 Day Post-Op Procedure(s) (LRB): RIGHT TOTAL HIP ARTHROPLASTY ANTERIOR APPROACH (Right) Patient reports pain as mild.   Patient seen in rounds with Dr. Wynelle Link. Patient is well, but has had some minor complaints of pain in the hip, requiring pain medications but much less than preop pain levels. We will start therapy today.  Plan is to go Home after hospital stay.  Objective: Vital signs in last 24 hours: Temp:  [97.2 F (36.2 C)-97.9 F (36.6 C)] 97.2 F (36.2 C) (04/07 0519) Pulse Rate:  [55-69] 60 (04/07 0519) Resp:  [11-18] 14 (04/07 0519) BP: (80-159)/(42-95) 123/45 mmHg (04/07 0519) SpO2:  [100 %] 100 % (04/07 0519) Weight:  [64.411 kg (142 lb)] 64.411 kg (142 lb) (04/06 1303)  Intake/Output from previous day:  Intake/Output Summary (Last 24 hours) at 02/20/15 0822 Last data filed at 02/20/15 0655  Gross per 24 hour  Intake   3670 ml  Output   1828 ml  Net   1842 ml    Intake/Output this shift: UOP 800 since MN  Labs:  Recent Labs  02/20/15 0450  HGB 9.7*    Recent Labs  02/20/15 0450  WBC 9.7  RBC 2.84*  HCT 28.8*  PLT 151    Recent Labs  02/20/15 0450  NA 138  K 4.5  CL 106  CO2 25  BUN 19  CREATININE 1.18*  GLUCOSE 144*  CALCIUM 8.8   No results for input(s): LABPT, INR in the last 72 hours.  EXAM General - Patient is Alert, Appropriate and Oriented Extremity - Neurovascular intact Sensation intact distally Dorsiflexion/Plantar flexion intact Dressing - dressing C/D/I Motor Function - intact, moving foot and toes well on exam.  Hemovac came out last night.  Past Medical History  Diagnosis Date  . Hypertension   . Hypothyroidism   . Depression   . Anxiety   . Arthritis   . Crohn's disease   . Pneumonia   . Varicose veins   . Osteoporosis     Assessment/Plan: 1 Day Post-Op Procedure(s) (LRB): RIGHT TOTAL HIP ARTHROPLASTY ANTERIOR APPROACH (Right) Principal Problem:   OA (osteoarthritis) of  hip  Estimated body mass index is 25.97 kg/(m^2) as calculated from the following:   Height as of this encounter: 5\' 2"  (1.575 m).   Weight as of this encounter: 64.411 kg (142 lb). Up with therapy Plan for discharge tomorrow Discharge home with home health  DVT Prophylaxis - Eliquis Weight Bearing As Tolerated right Leg Hemovac Pulled Begin Therapy Possibly home tomorrow. HGB 9.7.  Arlee Muslim, PA-C Orthopaedic Surgery 02/20/2015, 8:22 AM

## 2015-02-20 NOTE — Progress Notes (Signed)
Physical Therapy Treatment Patient Details Name: Diana Ramsey MRN: [id number] DOB: 04/10/1935 Today's Date: 02/20/2015    History of Present Illness Pt admitted for Rt THA (direct anterior approach)    PT Comments    POD # 1 pm session.  Assisted pt OOb to amb in hallway a short distance then returned to bed and applied ICE.    Follow Up Recommendations  Home health PT     Equipment Recommendations  Rolling walker with 5" wheels    Recommendations for Other Services       Precautions / Restrictions Precautions Precautions: None Restrictions Weight Bearing Restrictions: No Other Position/Activity Restrictions: WBAT    Mobility  Bed Mobility Overal bed mobility: Needs Assistance Bed Mobility: Supine to Sit;Sit to Supine     Supine to sit: Min assist     General bed mobility comments: assist for R LE only  Transfers Overall transfer level: Needs assistance Equipment used: Rolling walker (2 wheeled) Transfers: Sit to/from Stand Sit to Stand: Min guard;Min assist         General transfer comment: one VC on safety with hand placement stand to sit  Ambulation/Gait Ambulation/Gait assistance: Min guard;Min assist Ambulation Distance (Feet): 25 Feet Assistive device: Rolling walker (2 wheeled) Gait Pattern/deviations: Step-to pattern;Decreased stance time - right;Trunk flexed Gait velocity: decreased   General Gait Details: < 25% cues for sequence, posture and position from [employer]            Wheelchair Mobility    Modified Rankin (Stroke Patients Only)       Balance                                    Cognition Arousal/Alertness: Awake/alert Behavior During Therapy: WFL for tasks assessed/performed Overall Cognitive Status: Within Functional Limits for tasks assessed                      Exercises      General Comments        Pertinent Vitals/Pain Pain Assessment: 0-10 Pain Score: 3  Pain Location: R  hip Pain Descriptors / Indicators: Aching Pain Intervention(s): Monitored during session;Repositioned;Ice applied    Home Living                      Prior Function            PT Goals (current goals can now be found in the care plan section) Progress towards PT goals: Progressing toward goals    Frequency       PT Plan      Co-evaluation             End of Session Equipment Utilized During Treatment: Gait belt Activity Tolerance: Patient tolerated treatment well;Patient limited by fatigue Patient left: in bed;with call bell/phone within reach;with family/visitor present     Time: 1405-1420 PT Time Calculation (min) (ACUTE ONLY): 15 min  Charges:  $Gait Training: 8-22 mins                    G Codes:      Rica Koyanagi  PTA [facility]  Acute  Rehab Pager      [phone]

## 2015-02-20 NOTE — Progress Notes (Signed)
Utilization review completed.  

## 2015-02-20 NOTE — Evaluation (Signed)
Physical Therapy Evaluation Patient Details Name: Diana Ramsey MRN: [id number] DOB: 1935/07/03 Today's Date: 02/20/2015   History of Present Illness  Pt admitted for Rt THA (direct anterior approach)  Clinical Impression  Pt s/p R THR presents with decreased R LE strength/ROM and post op pain limiting functional mobility.  Pt should progress to dc home with family assist and HHPT follow up.    Follow Up Recommendations Home health PT    Equipment Recommendations  Rolling walker with 5" wheels    Recommendations for Other Services OT consult     Precautions / Restrictions Precautions Precautions: None Restrictions Weight Bearing Restrictions: No Other Position/Activity Restrictions: WBAT      Mobility  Bed Mobility Overal bed mobility: Needs Assistance Bed Mobility: Supine to Sit     Supine to sit: Mod assist     General bed mobility comments: cues for sequence and use of L LE to self assist.  Assist to manage R LE and to bring trunk to upright  Transfers Overall transfer level: Needs assistance Equipment used: Rolling walker (2 wheeled) Transfers: Sit to/from [employer] Sit to Stand: Min assist Stand pivot transfers: Min assist       General transfer comment: Min A to power up from sitting and for balance   Ambulation/Gait Ambulation/Gait assistance: Min assist Ambulation Distance (Feet): 23 Feet Assistive device: Rolling walker (2 wheeled) Gait Pattern/deviations: Step-to pattern;Decreased step length - right;Decreased step length - left;Shuffle;Trunk flexed Gait velocity: decr   General Gait Details: cues for sequence, posture and position from [employer]            Wheelchair Mobility    Modified Rankin (Stroke Patients Only)       Balance                                             Pertinent Vitals/Pain Pain Assessment: 0-10 Pain Score: 3  Pain Location: Rt hip Pain Descriptors / Indicators:  Aching Pain Intervention(s): Monitored during session;Ice applied    Home Living Family/patient expects to be discharged to:: Private residence Living Arrangements: Alone Available Help at Discharge: Family;Available 24 hours/day (son is here for ~ 1 week and will assist ) Type of Home: House Home Access: Stairs to enter Entrance Stairs-Rails: None Entrance Stairs-Number of Steps: 2 Home Layout: One level Home Equipment: Cane - single point Additional Comments: Pt states son will be staying with her to assist    Prior Function Level of Independence: Independent               Hand Dominance   Dominant Hand: Right    Extremity/Trunk Assessment   Upper Extremity Assessment: Overall WFL for tasks assessed           Lower Extremity Assessment: Defer to PT evaluation RLE Deficits / Details: Hip strength 2/5 with AAROM at hip to 70 flex and 10 abd LLE Deficits / Details: Strength WFL but limited hip flex and abd  Cervical / Trunk Assessment: Normal  Communication   Communication: No difficulties  Cognition Arousal/Alertness: Awake/alert Behavior During Therapy: WFL for tasks assessed/performed Overall Cognitive Status: Within Functional Limits for tasks assessed                      General Comments      Exercises Total Joint Exercises Ankle Circles/Pumps: AROM;Both;15 reps;Supine  Quad Sets: AROM;Both;5 reps;Supine Heel Slides: AAROM;Right;10 reps;Supine Hip ABduction/ADduction: AAROM;Right;10 reps;Supine      Assessment/Plan    PT Assessment Patient needs continued PT services  PT Diagnosis Difficulty walking   PT Problem List Decreased strength;Decreased range of motion;Decreased activity tolerance;Decreased mobility;Decreased knowledge of use of DME;Pain  PT Treatment Interventions DME instruction;Gait training;Stair training;Therapeutic activities;Functional mobility training;Therapeutic exercise;Patient/family education   PT Goals (Current  goals can be found in the Care Plan section) Acute Rehab PT Goals Patient Stated Goal: to regain independence  PT Goal Formulation: With patient Time For Goal Achievement: 02/27/15 Potential to Achieve Goals: Good    Frequency 7X/week   Barriers to discharge        Co-evaluation               End of Session Equipment Utilized During Treatment: Gait belt Activity Tolerance: Patient tolerated treatment well;Patient limited by fatigue Patient left: in chair;with call bell/phone within reach;with family/visitor present Nurse Communication: Mobility status         Time: 1024-1050 PT Time Calculation (min) (ACUTE ONLY): 26 min   Charges:   PT Evaluation $Initial PT Evaluation Tier I: 1 Procedure PT Treatments $Therapeutic Exercise: 8-22 mins   PT G Codes:        Diana Ramsey 2015-03-16, 12:40 PM

## 2015-02-20 NOTE — Evaluation (Signed)
Occupational Therapy Evaluation Patient Details Name: Diana Ramsey MRN: [id number] DOB: 1935/05/04 Today's Date: 02/20/2015    History of Present Illness Pt admitted for Rt THA (direct anterior approach)   Clinical Impression   Pt admitted with above. She demonstrates the below listed deficits and will benefit from continued OT to maximize safety and independence with BADLs.  Pt is very motivated and son able to assist as needed at discharge.  Will follow.        Follow Up Recommendations  No OT follow up;Supervision/Assistance - 24 hour    Equipment Recommendations  3 in 1 bedside comode    Recommendations for Other Services       Precautions / Restrictions Precautions Precautions: None Restrictions Weight Bearing Restrictions: No      Mobility Bed Mobility                  Transfers Overall transfer level: Needs assistance   Transfers: Sit to/from Stand;Stand Pivot Transfers Sit to Stand: Min assist Stand pivot transfers: Min assist       General transfer comment: Min A to power up from sitting and for balance     Balance                                            ADL Overall ADL's : Needs assistance/impaired Eating/Feeding: Independent   Grooming: Wash/dry hands;Wash/dry face;Oral care;Brushing hair;Minimal assistance;Standing   Upper Body Bathing: Set up;Sitting   Lower Body Bathing: Minimal assistance;Sit to/from stand   Upper Body Dressing : Set up;Sitting   Lower Body Dressing: Moderate assistance;Sit to/from stand Lower Body Dressing Details (indicate cue type and reason): unable to access Rt foot  Toilet Transfer: Minimal assistance;Ambulation;Comfort height toilet;Grab bars Toilet Transfer Details (indicate cue type and reason): Pt will benefit from [place] and Hygiene: Minimal assistance;Sit to/from Nurse, children's Details (indicate cue type and reason): discussed  options for tub seat with pt  Functional mobility during ADLs: Minimal assistance;Rolling walker       Vision     Perception     Praxis      Pertinent Vitals/Pain Pain Assessment: 0-10 Pain Score: 3  Pain Location: Rt hip Pain Descriptors / Indicators: Aching Pain Intervention(s): Monitored during session;Ice applied     Hand Dominance Right   Extremity/Trunk Assessment Upper Extremity Assessment Upper Extremity Assessment: Overall WFL for tasks assessed   Lower Extremity Assessment Lower Extremity Assessment: Defer to PT evaluation   Cervical / Trunk Assessment Cervical / Trunk Assessment: Normal   Communication Communication Communication: No difficulties   Cognition Arousal/Alertness: Awake/alert Behavior During Therapy: WFL for tasks assessed/performed Overall Cognitive Status: Within Functional Limits for tasks assessed                     General Comments       Exercises       Shoulder Instructions      Home Living Family/patient expects to be discharged to:: Private residence Living Arrangements: Alone Available Help at Discharge: Family;Available 24 hours/day (son is here for ~ 1 week and will assist ) Type of Home: House             Bathroom Shower/Tub: Tub/shower unit;Curtain Shower/tub characteristics: Curtain   Bathroom Accessibility: Yes  Prior Functioning/Environment Level of Independence: Independent             OT Diagnosis: Generalized weakness;Acute pain   OT Problem List: Decreased strength;Decreased knowledge of use of DME or AE;Pain   OT Treatment/Interventions: Self-care/ADL training;Therapeutic exercise;DME and/or AE instruction;Patient/family education;Therapeutic activities    OT Goals(Current goals can be found in the care plan section) Acute Rehab OT Goals Patient Stated Goal: to regain independence  OT Goal Formulation: With patient Time For Goal Achievement: 02/27/15 Potential to  Achieve Goals: Good ADL Goals Pt Will Perform Grooming: with supervision;standing Pt Will Perform Lower Body Bathing: with supervision;sit to/from stand Pt Will Perform Lower Body Dressing: with supervision;sit to/from stand Pt Will Transfer to Toilet: with supervision;ambulating;regular height toilet;bedside commode;grab bars Pt Will Perform Toileting - Clothing Manipulation and hygiene: with supervision;sit to/from stand Pt Will Perform Tub/Shower Transfer: Tub transfer;with min guard assist;ambulating;shower seat;rolling walker  OT Frequency: Min 2X/week   Barriers to D/C:            Co-evaluation              End of Session Equipment Utilized During Treatment: Rolling walker Nurse Communication: Mobility status  Activity Tolerance: Patient tolerated treatment well Patient left: in chair;with call bell/phone within reach;with family/visitor present   Time: 1110-1133 OT Time Calculation (min): 23 min Charges:  OT General Charges $OT Visit: 1 Procedure OT Evaluation $Initial OT Evaluation Tier I: 1 Procedure OT Treatments $Self Care/Home Management : 8-22 mins G-Codes:    Diana Ramsey M 03/21/2015, 11:46 AM

## 2015-02-21 LAB — BASIC METABOLIC PANEL
Anion gap: 6 (ref 5–15)
BUN: 25 mg/dL — ABNORMAL HIGH (ref 6–23)
CO2: 27 mmol/L (ref 19–32)
CREATININE: 1.11 mg/dL — AB (ref 0.50–1.10)
Calcium: 8.9 mg/dL (ref 8.4–10.5)
Chloride: 107 mmol/L (ref 96–112)
GFR calc Af Amer: 54 mL/min — ABNORMAL LOW (ref >90)
GFR, EST NON AFRICAN AMERICAN: 47 mL/min — AB (ref >90)
Glucose, Bld: 123 mg/dL — ABNORMAL HIGH (ref 70–99)
Potassium: 4.2 mmol/L (ref 3.5–5.1)
SODIUM: 140 mmol/L (ref 135–145)

## 2015-02-21 LAB — CBC
HEMATOCRIT: 26.7 % — AB (ref 36.0–46.0)
Hemoglobin: 9 g/dL — ABNORMAL LOW (ref 12.0–15.0)
MCH: 34.4 pg — AB (ref 26.0–34.0)
MCHC: 33.7 g/dL (ref 30.0–36.0)
MCV: 101.9 fL — ABNORMAL HIGH (ref 78.0–100.0)
Platelets: 146 10*3/uL — ABNORMAL LOW (ref 150–400)
RBC: 2.62 MIL/uL — ABNORMAL LOW (ref 3.87–5.11)
RDW: 12.9 % (ref 11.5–15.5)
WBC: 13.4 10*3/uL — ABNORMAL HIGH (ref 4.0–10.5)

## 2015-02-21 MED ORDER — TRAMADOL HCL 50 MG PO TABS: 50.0000 mg | ORAL_TABLET | Freq: Two times a day (BID) | ORAL | Status: DC | PRN

## 2015-02-21 MED ORDER — HYDROMORPHONE HCL 2 MG PO TABS: 2.0000 mg | ORAL_TABLET | ORAL | Status: DC | PRN

## 2015-02-21 MED ORDER — METHOCARBAMOL 500 MG PO TABS: 500.0000 mg | ORAL_TABLET | Freq: Four times a day (QID) | ORAL | Status: DC | PRN

## 2015-02-21 MED ORDER — APIXABAN 2.5 MG PO TABS: 2.5000 mg | ORAL_TABLET | Freq: Two times a day (BID) | ORAL | Status: DC

## 2015-02-21 NOTE — Progress Notes (Signed)
Physical Therapy Treatment Patient Details Name: Diana Ramsey MRN: [id number] DOB: Oct 02, 1935 Today's Date: 02/21/2015    History of Present Illness Pt admitted for Rt THA (direct anterior approach)    PT Comments    POD # 2 am session did not go well.  Assisted pt from supine to EOB with min assist no problems.  Pt c/o 4/10 hip pain.  RN was in room and gave pt IV Dilaudid.  Attempted amb however after 2 feet B knees buckled and pt c/o max dizziness.  Recliner brought to pt from behind and placed in recovery position.  BP 104/77, HR 83, RA 99%  Follow Up Recommendations  Home health PT     Equipment Recommendations  Rolling walker with 5" wheels    Recommendations for Other Services       Precautions / Restrictions Precautions Precautions: None Restrictions Weight Bearing Restrictions: No Other Position/Activity Restrictions: WBAT    Mobility  Bed Mobility Overal bed mobility: Needs Assistance Bed Mobility: Supine to Sit     Supine to sit: Min assist     General bed mobility comments: assist for R LE only plus increased time  Transfers Overall transfer level: Needs assistance Equipment used: Rolling walker (2 wheeled) Transfers: Sit to/from Stand Sit to Stand: Min assist;Mod assist         General transfer comment: one VC on safety with hand placement stand to sit  Ambulation/Gait Ambulation/Gait assistance: Max assist Ambulation Distance (Feet): 2 Feet Assistive device: Rolling walker (2 wheeled) Gait Pattern/deviations: Step-to pattern Gait velocity: decreased   General Gait Details: pt very groggy with B knee buckling.  Great difficulty standing and MAX c/o dizziness.  Recliner brought to pt and RN notified.    Stairs            Wheelchair Mobility    Modified Rankin (Stroke Patients Only)       Balance                                    Cognition Arousal/Alertness: Lethargic (groggy)                           Exercises      General Comments        Pertinent Vitals/Pain Pain Assessment: 0-10 Pain Score: 0-No pain Pain Location: R hip Pain Intervention(s): Monitored during session;Premedicated before session;Repositioned;Ice applied    Home Living                      Prior Function            PT Goals (current goals can now be found in the care plan section) Progress towards PT goals: Progressing toward goals    Frequency  7X/week    PT Plan      Co-evaluation             End of Session Equipment Utilized During Treatment: Gait belt Activity Tolerance: Patient limited by lethargy (IV pain meds ?) Patient left: in chair;with call bell/phone within reach;with nursing/sitter in room     Time: 9381-8299 PT Time Calculation (min) (ACUTE ONLY): 23 min  Charges:  $Gait Training: 8-22 mins $Therapeutic Activity: 8-22 mins                    G Codes:      Rica Koyanagi  PTA [facility]  Acute  Rehab Pager      [phone]

## 2015-02-21 NOTE — Progress Notes (Signed)
OT Cancellation Note  Patient Details Name: Diana Ramsey MRN: [id number] DOB: 04-Sep-1935   Cancelled Treatment:    Reason Eval/Treat Not Completed: Fatigue/lethargy limiting ability to participate.  Pt. States she is "too weak" to do anything right now.  Will attempt back later today or tom. As able.  Janice Coffin, COTA/L 02/21/2015, 9:38 AM

## 2015-02-21 NOTE — Progress Notes (Signed)
   Subjective: 2 Days Post-Op Procedure(s) (LRB): RIGHT TOTAL HIP ARTHROPLASTY ANTERIOR APPROACH (Right) Patient reports pain as mild.   Patient seen in rounds with Dr. Wynelle Link.  Sitting up in bed and looks good this morning. Patient is well, and has had no acute complaints or problems Patient doing well and will see how she does today.  If does great and meets all goals, possible home later today, or plan for tomorrow.  Objective: Vital signs in last 24 hours: Temp:  [97.4 F (36.3 C)-98.3 F (36.8 C)] 98 F (36.7 C) (04/08 0510) Pulse Rate:  [64-83] 82 (04/08 0917) Resp:  [14-16] 16 (04/08 0510) BP: (101-146)/(35-94) 106/36 mmHg (04/08 0917) SpO2:  [64 %-99 %] 99 % (04/08 0510)  Intake/Output from previous day:  Intake/Output Summary (Last 24 hours) at 02/21/15 1050 Last data filed at 02/21/15 0938  Gross per 24 hour  Intake    480 ml  Output    850 ml  Net   -370 ml    Intake/Output this shift: Total I/O In: -  Out: 400 [Urine:400]  Labs:  Recent Labs  02/20/15 0450 02/21/15 0500  HGB 9.7* 9.0*    Recent Labs  02/20/15 0450 02/21/15 0500  WBC 9.7 13.4*  RBC 2.84* 2.62*  HCT 28.8* 26.7*  PLT 151 146*    Recent Labs  02/20/15 0450 02/21/15 0500  NA 138 140  K 4.5 4.2  CL 106 107  CO2 25 27  BUN 19 25*  CREATININE 1.18* 1.11*  GLUCOSE 144* 123*  CALCIUM 8.8 8.9   No results for input(s): LABPT, INR in the last 72 hours.  EXAM: General - Patient is Alert, Appropriate and Oriented Extremity - Neurovascular intact Sensation intact distally Dorsiflexion/Plantar flexion intact No cellulitis present Incision - clean, dry, no drainage, healing Motor Function - intact, moving foot and toes well on exam.   Assessment/Plan: 2 Days Post-Op Procedure(s) (LRB): RIGHT TOTAL HIP ARTHROPLASTY ANTERIOR APPROACH (Right) Procedure(s) (LRB): RIGHT TOTAL HIP ARTHROPLASTY ANTERIOR APPROACH (Right) Past Medical History  Diagnosis Date  . Hypertension   .  Hypothyroidism   . Depression   . Anxiety   . Arthritis   . Crohn's disease   . Pneumonia   . Varicose veins   . Osteoporosis    Principal Problem:   OA (osteoarthritis) of hip  Estimated body mass index is 25.97 kg/(m^2) as calculated from the following:   Height as of this encounter: 5\' 2"  (1.575 m).   Weight as of this encounter: 64.411 kg (142 lb). Up with therapy Discharge home with home health when ready Diet - Cardiac diet Follow up - in 2 weeks Activity - WBAT Disposition - Home Condition Upon Discharge - Pending D/C Meds - See DC Summary DVT Prophylaxis - eliquis HGB 9.0 - Iron supplement  Arlee Muslim, PA-C Orthopaedic Surgery 02/21/2015, 10:50 AM

## 2015-02-21 NOTE — Progress Notes (Signed)
Physical Therapy Treatment Patient Details Name: Diana Ramsey MRN: [id number] DOB: November 25, 1934 Today's Date: 02/21/2015    History of Present Illness Pt admitted for Rt THA (direct anterior approach)    PT Comments     poD # 2 pm session.  Pt feeling a little better (less groggy) but c/o increased pain.  Amb a limited distance then performed TE's while in recliner followed by ICE.  Pt plans to D/C to home tomorrow.   Follow Up Recommendations  Home health PT     Equipment Recommendations  Rolling walker with 5" wheels    Recommendations for Other Services       Precautions / Restrictions Precautions Precautions: None Restrictions Weight Bearing Restrictions: No Other Position/Activity Restrictions: WBAT    Mobility  Bed Mobility Overal bed mobility: Needs Assistance Bed Mobility: Supine to Sit     Supine to sit: Min assist     General bed mobility comments: assist for R LE only plus increased time  Transfers Overall transfer level: Needs assistance Equipment used: Rolling walker (2 wheeled) Transfers: Sit to/from Stand Sit to Stand: Min assist         General transfer comment: one VC on safety with hand placement stand to sit  Ambulation/Gait Ambulation/Gait assistance: Mod assist Ambulation Distance (Feet): 14 Feet Assistive device: Rolling walker (2 wheeled) Gait Pattern/deviations: Step-to pattern;Decreased stance time - right Gait velocity: decreased   General Gait Details: decreased amb distance due to increased pain level.  Son assisted by following with recliner.    Stairs            Wheelchair Mobility    Modified Rankin (Stroke Patients Only)       Balance                                    Cognition Arousal/Alertness: Lethargic (groggy)                          Exercises   Total Hip Replacement TE's 10 reps ankle pumps 10 reps knee presses 10 reps heel slides 10 reps LAQ's 10 reps  ABD Followed by ICE     General Comments        Pertinent Vitals/Pain Pain Assessment: 0-10 Pain Score: 0-No pain Pain Location: R hip Pain Intervention(s): Monitored during session;Premedicated before session;Repositioned;Ice applied    Home Living                      Prior Function            PT Goals (current goals can now be found in the care plan section) Progress towards PT goals: Progressing toward goals    Frequency  7X/week    PT Plan      Co-evaluation             End of Session Equipment Utilized During Treatment: Gait belt Activity Tolerance: Patient limited by fatigue;Patient limited by pain Patient left: in chair;with call bell/phone within reach;with family/visitor present     Time: 1435-1500 PT Time Calculation (min) (ACUTE ONLY): 25 min  Charges:  $Gait Training: 8-22 mins $Therapeutic Activity: 8-22 mins                    G Codes:      Rica Koyanagi  PTA [facility]  Acute  Rehab Pager  [phone]  

## 2015-02-21 NOTE — Discharge Summary (Signed)
Physician Discharge Summary   Patient ID: Diana Ramsey MRN: [id number] DOB/AGE: 78-Dec-1936 78 y.o.  Admit date: 02/19/2015 Discharge date: 02-24-2015  Primary Diagnosis:  Osteoarthritis of the Right hip.   Admission Diagnoses:  Past Medical History  Diagnosis Date  . Hypertension   . Hypothyroidism   . Depression   . Anxiety   . Arthritis   . Crohn's disease   . Pneumonia   . Varicose veins   . Osteoporosis    Discharge Diagnoses:   Principal Problem:   OA (osteoarthritis) of hip  Estimated body mass index is 25.97 kg/(m^2) as calculated from the following:   Height as of this encounter: '5\' 2"'  (1.575 m).   Weight as of this encounter: 64.411 kg (142 lb).  Procedure(s) (LRB): RIGHT TOTAL HIP ARTHROPLASTY ANTERIOR APPROACH (Right)   Consults: None  HPI: Diana Ramsey is a 80 y.o. female who has advanced  arthritis of her Right hip with progressively worsening pain and  dysfunction.The patient has failed nonoperative management and presents for  total hip arthroplasty.   Laboratory Data: Admission on 02/19/2015, Discharged on 02/24/2015  Component Date Value Ref Range Status  . WBC 02/20/2015 9.7  4.0 - 10.5 K/uL Final  . RBC 02/20/2015 2.84* 3.87 - 5.11 MIL/uL Final  . Hemoglobin 02/20/2015 9.7* 12.0 - 15.0 g/dL Final  . HCT 02/20/2015 28.8* 36.0 - 46.0 % Final  . MCV 02/20/2015 101.4* 78.0 - 100.0 fL Final  . MCH 02/20/2015 34.2* 26.0 - 34.0 pg Final  . MCHC 02/20/2015 33.7  30.0 - 36.0 g/dL Final  . RDW 02/20/2015 12.5  11.5 - 15.5 % Final  . Platelets 02/20/2015 151  150 - 400 K/uL Final  . Sodium 02/20/2015 138  135 - 145 mmol/L Final  . Potassium 02/20/2015 4.5  3.5 - 5.1 mmol/L Final  . Chloride 02/20/2015 106  96 - 112 mmol/L Final  . CO2 02/20/2015 25  19 - 32 mmol/L Final  . Glucose, Bld 02/20/2015 144* 70 - 99 mg/dL Final  . BUN 02/20/2015 19  6 - 23 mg/dL Final  . Creatinine, Ser 02/20/2015 1.18* 0.50 - 1.10 mg/dL Final  . Calcium 02/20/2015  8.8  8.4 - 10.5 mg/dL Final  . GFR calc non Af Amer 02/20/2015 43* >90 mL/min Final  . GFR calc Af Amer 02/20/2015 50* >90 mL/min Final   Comment: (NOTE) The eGFR has been calculated using the CKD EPI equation. This calculation has not been validated in all clinical situations. eGFR's persistently <90 mL/min signify possible Chronic Kidney Disease.   . Anion gap 02/20/2015 7  5 - 15 Final  . WBC 02/21/2015 13.4* 4.0 - 10.5 K/uL Final  . RBC 02/21/2015 2.62* 3.87 - 5.11 MIL/uL Final  . Hemoglobin 02/21/2015 9.0* 12.0 - 15.0 g/dL Final  . HCT 02/21/2015 26.7* 36.0 - 46.0 % Final  . MCV 02/21/2015 101.9* 78.0 - 100.0 fL Final  . MCH 02/21/2015 34.4* 26.0 - 34.0 pg Final  . MCHC 02/21/2015 33.7  30.0 - 36.0 g/dL Final  . RDW 02/21/2015 12.9  11.5 - 15.5 % Final  . Platelets 02/21/2015 146* 150 - 400 K/uL Final  . Sodium 02/21/2015 140  135 - 145 mmol/L Final  . Potassium 02/21/2015 4.2  3.5 - 5.1 mmol/L Final  . Chloride 02/21/2015 107  96 - 112 mmol/L Final  . CO2 02/21/2015 27  19 - 32 mmol/L Final  . Glucose, Bld 02/21/2015 123* 70 - 99 mg/dL Final  . BUN  02/21/2015 25* 6 - 23 mg/dL Final  . Creatinine, Ser 02/21/2015 1.11* 0.50 - 1.10 mg/dL Final  . Calcium 02/21/2015 8.9  8.4 - 10.5 mg/dL Final  . GFR calc non Af Amer 02/21/2015 47* >90 mL/min Final  . GFR calc Af Amer 02/21/2015 54* >90 mL/min Final   Comment: (NOTE) The eGFR has been calculated using the CKD EPI equation. This calculation has not been validated in all clinical situations. eGFR's persistently <90 mL/min signify possible Chronic Kidney Disease.   . Anion gap 02/21/2015 6  5 - 15 Final  . WBC 02/22/2015 9.6  4.0 - 10.5 K/uL Final  . RBC 02/22/2015 2.48* 3.87 - 5.11 MIL/uL Final  . Hemoglobin 02/22/2015 8.4* 12.0 - 15.0 g/dL Final  . HCT 02/22/2015 25.6* 36.0 - 46.0 % Final  . MCV 02/22/2015 103.2* 78.0 - 100.0 fL Final  . MCH 02/22/2015 33.9  26.0 - 34.0 pg Final  . MCHC 02/22/2015 32.8  30.0 - 36.0 g/dL  Final  . RDW 02/22/2015 13.2  11.5 - 15.5 % Final  . Platelets 02/22/2015 129* 150 - 400 K/uL Final  . WBC 02/23/2015 10.4  4.0 - 10.5 K/uL Final  . RBC 02/23/2015 2.73* 3.87 - 5.11 MIL/uL Final  . Hemoglobin 02/23/2015 9.1* 12.0 - 15.0 g/dL Final  . HCT 02/23/2015 28.6* 36.0 - 46.0 % Final  . MCV 02/23/2015 104.8* 78.0 - 100.0 fL Final  . MCH 02/23/2015 33.3  26.0 - 34.0 pg Final  . MCHC 02/23/2015 31.8  30.0 - 36.0 g/dL Final  . RDW 02/23/2015 13.2  11.5 - 15.5 % Final  . Platelets 02/23/2015 186  150 - 400 K/uL Final   DELTA CHECK NOTED  . Sodium 02/23/2015 140  135 - 145 mmol/L Final  . Potassium 02/23/2015 3.4* 3.5 - 5.1 mmol/L Final   Comment: DELTA CHECK NOTED REPEATED TO VERIFY   . Chloride 02/23/2015 105  96 - 112 mmol/L Final  . CO2 02/23/2015 25  19 - 32 mmol/L Final  . Glucose, Bld 02/23/2015 102* 70 - 99 mg/dL Final  . BUN 02/23/2015 12  6 - 23 mg/dL Final   Comment: DELTA CHECK NOTED REPEATED TO VERIFY   . Creatinine, Ser 02/23/2015 1.03  0.50 - 1.10 mg/dL Final  . Calcium 02/23/2015 8.9  8.4 - 10.5 mg/dL Final  . GFR calc non Af Amer 02/23/2015 51* >90 mL/min Final  . GFR calc Af Amer 02/23/2015 59* >90 mL/min Final   Comment: (NOTE) The eGFR has been calculated using the CKD EPI equation. This calculation has not been validated in all clinical situations. eGFR's persistently <90 mL/min signify possible Chronic Kidney Disease.   . Anion gap 02/23/2015 10  5 - 15 Final  . Sodium 02/24/2015 140  135 - 145 mmol/L Final  . Potassium 02/24/2015 4.2  3.5 - 5.1 mmol/L Final   Comment: REPEATED TO VERIFY NO VISIBLE HEMOLYSIS DELTA CHECK NOTED   . Chloride 02/24/2015 106  96 - 112 mmol/L Final  . CO2 02/24/2015 26  19 - 32 mmol/L Final  . Glucose, Bld 02/24/2015 109* 70 - 99 mg/dL Final  . BUN 02/24/2015 14  6 - 23 mg/dL Final  . Creatinine, Ser 02/24/2015 0.97  0.50 - 1.10 mg/dL Final  . Calcium 02/24/2015 8.9  8.4 - 10.5 mg/dL Final  . GFR calc non Af Amer  02/24/2015 55* >90 mL/min Final  . GFR calc Af Amer 02/24/2015 64* >90 mL/min Final   Comment: (NOTE) The eGFR  has been calculated using the CKD EPI equation. This calculation has not been validated in all clinical situations. eGFR's persistently <90 mL/min signify possible Chronic Kidney Disease.   . Anion gap 02/24/2015 8  5 - 15 Final  . Glucose-Capillary 02/23/2015 115* 70 - 99 mg/dL Final  . Comment 1 02/23/2015 Notify RN   Final  Hospital Outpatient Visit on 02/11/2015  Component Date Value Ref Range Status  . aPTT 02/11/2015 31  24 - 37 seconds Final  . WBC 02/11/2015 10.1  4.0 - 10.5 K/uL Final  . RBC 02/11/2015 3.90  3.87 - 5.11 MIL/uL Final  . Hemoglobin 02/11/2015 13.2  12.0 - 15.0 g/dL Final  . HCT 02/11/2015 39.9  36.0 - 46.0 % Final  . MCV 02/11/2015 102.3* 78.0 - 100.0 fL Final  . MCH 02/11/2015 33.8  26.0 - 34.0 pg Final  . MCHC 02/11/2015 33.1  30.0 - 36.0 g/dL Final  . RDW 02/11/2015 12.5  11.5 - 15.5 % Final  . Platelets 02/11/2015 212  150 - 400 K/uL Final  . Sodium 02/11/2015 138  135 - 145 mmol/L Final  . Potassium 02/11/2015 5.6* 3.5 - 5.1 mmol/L Final  . Chloride 02/11/2015 103  96 - 112 mmol/L Final  . CO2 02/11/2015 26  19 - 32 mmol/L Final  . Glucose, Bld 02/11/2015 107* 70 - 99 mg/dL Final  . BUN 02/11/2015 30* 6 - 23 mg/dL Final  . Creatinine, Ser 02/11/2015 1.43* 0.50 - 1.10 mg/dL Final  . Calcium 02/11/2015 9.7  8.4 - 10.5 mg/dL Final  . Total Protein 02/11/2015 7.3  6.0 - 8.3 g/dL Final  . Albumin 02/11/2015 4.1  3.5 - 5.2 g/dL Final  . AST 02/11/2015 28  0 - 37 U/L Final  . ALT 02/11/2015 23  0 - 35 U/L Final  . Alkaline Phosphatase 02/11/2015 79  39 - 117 U/L Final  . Total Bilirubin 02/11/2015 0.6  0.3 - 1.2 mg/dL Final  . GFR calc non Af Amer 02/11/2015 34* >90 mL/min Final  . GFR calc Af Amer 02/11/2015 40* >90 mL/min Final   Comment: (NOTE) The eGFR has been calculated using the CKD EPI equation. This calculation has not been validated in  all clinical situations. eGFR's persistently <90 mL/min signify possible Chronic Kidney Disease.   . Anion gap 02/11/2015 9  5 - 15 Final  . Prothrombin Time 02/11/2015 13.1  11.6 - 15.2 seconds Final  . INR 02/11/2015 0.98  0.00 - 1.49 Final  . ABO/RH(D) 02/11/2015 O POS   Final  . Antibody Screen 02/11/2015 NEG   Final  . Sample Expiration 02/11/2015 02/22/2015   Final  . Color, Urine 02/11/2015 YELLOW  YELLOW Final  . APPearance 02/11/2015 CLEAR  CLEAR Final  . Specific Gravity, Urine 02/11/2015 1.015  1.005 - 1.030 Final  . pH 02/11/2015 6.0  5.0 - 8.0 Final  . Glucose, UA 02/11/2015 NEGATIVE  NEGATIVE mg/dL Final  . Hgb urine dipstick 02/11/2015 NEGATIVE  NEGATIVE Final  . Bilirubin Urine 02/11/2015 NEGATIVE  NEGATIVE Final  . Ketones, ur 02/11/2015 NEGATIVE  NEGATIVE mg/dL Final  . Protein, ur 02/11/2015 NEGATIVE  NEGATIVE mg/dL Final  . Urobilinogen, UA 02/11/2015 0.2  0.0 - 1.0 mg/dL Final  . Nitrite 02/11/2015 NEGATIVE  NEGATIVE Final  . Leukocytes, UA 02/11/2015 NEGATIVE  NEGATIVE Final   MICROSCOPIC NOT DONE ON URINES WITH NEGATIVE PROTEIN, BLOOD, LEUKOCYTES, NITRITE, OR GLUCOSE <1000 mg/dL.  Marland Kitchen MRSA, PCR 02/11/2015 NEGATIVE  NEGATIVE Final  . Staphylococcus  aureus 02/11/2015 POSITIVE* NEGATIVE Final   Comment:        The Xpert SA Assay (FDA approved for NASAL specimens in patients over 26 years of age), is one component of a comprehensive surveillance program.  Test performance has been validated by [facility] for patients greater than or equal to 72 year old. It is not intended to diagnose infection nor to guide or monitor treatment.   . ABO/RH(D) 02/11/2015 O POS   Final     X-Rays:Dg Pelvis Portable  02/19/2015   CLINICAL DATA:  Status post right total hip arthroplasty.  EXAM: PORTABLE PELVIS 1-2 VIEWS  COMPARISON:  None.  FINDINGS: Hardware components of a right hip arthroplasty are identified.There is no periprosthetic fracture or subluxation. The components  are in anatomic alignment. Gas noted within the surrounding soft tissues. Surgical drain overlies the proximal right femur.  IMPRESSION: 1. Status post right total hip arthroplasty.  No acute findings.   Electronically Signed   By: Kerby Moors M.D.   On: 02/19/2015 18:54   Dg C-arm 1-60 Min-no Report  02/19/2015   CLINICAL DATA: Righ Total Hip Replacement   C-ARM 1-60 MINUTES  Fluoroscopy was utilized by the requesting physician.  No radiographic  interpretation.     EKG:No orders found for this or any previous visit.   Hospital Course: Patient was admitted to [facility] and taken to the OR and underwent the above state procedure without complications.  Patient tolerated the procedure well and was later transferred to the recovery room and then to the orthopaedic floor for postoperative care.  They were given PO and IV analgesics for pain control following their surgery.  They were given 24 hours of postoperative antibiotics of  Anti-infectives    Start     Dose/Rate Route Frequency Ordered Stop   02/20/15 0600  ceFAZolin (ANCEF) IVPB 2 g/50 mL premix     2 g 100 mL/hr over 30 Minutes Intravenous On call to O.R. 02/19/15 1306 02/19/15 1555   02/19/15 2200  ceFAZolin (ANCEF) IVPB 2 g/50 mL premix     2 g 100 mL/hr over 30 Minutes Intravenous Every 6 hours 02/19/15 1844 02/20/15 0500     and started on DVT prophylaxis in the form of Eliquis.   PT and OT were ordered for total hip protocol.  The patient was allowed to be WBAT with therapy. Discharge planning was consulted to help with postop disposition and equipment needs.  Patient had a decent night on the evening of surgery.  They started to get up OOB with therapy on day one.  Her preop level of pain had already improved. Hemovac drain was pulled without difficulty.  Continued to work with therapy into day two.  Dressing was changed on day two and the incision was healing well.  On day three, the patient had increased pain and  developed some low pressures requiring fluids.  Incision was healing well.  Patient was seen in rounds on day four and noted to have nausea.  Changed some of her oral medications. DC'd the Dilaudid. She was seen on POD 5 and doing better. She worked with therapy was ready to go home.  Discharge home with home health Diet - Cardiac diet Follow up - in 2 weeks Activity - WBAT Disposition - Home Condition Upon Discharge - Good D/C Meds - See DC Summary DVT Prophylaxis - Eliquis      Discharge Instructions    Call MD / Call 911  Complete by:  As directed   If you experience chest pain or shortness of breath, CALL 911 and be transported to the hospital emergency room.  If you develope a fever above 101 F, pus (white drainage) or increased drainage or redness at the wound, or calf pain, call your surgeon's office.     Change dressing    Complete by:  As directed   You may change your dressing dressing daily with sterile 4 x 4 inch gauze dressing and paper tape.  Do not submerge the incision under water.     Constipation Prevention    Complete by:  As directed   Drink plenty of fluids.  Prune juice may be helpful.  You may use a stool softener, such as Colace (over the counter) 100 mg twice a day.  Use MiraLax (over the counter) for constipation as needed.     Diet - low sodium heart healthy    Complete by:  As directed      Discharge instructions    Complete by:  As directed   Pick up stool softner and laxative for home use following surgery while on pain medications. Do not submerge incision under water. Please use good hand washing techniques while changing dressing each day. May shower starting three days after surgery. Please use a clean towel to pat the incision dry following showers. Continue to use ice for pain and swelling after surgery. Do not use any lotions or creams on the incision until instructed by your surgeon.   Total Hip Protocol.  Take Eliquis for two and a half  more weeks, then discontinue Eliquis. Once the patient has completed the blood thinner regimen, then take a Baby 81 mg Aspirin daily for three more weeks.  Postoperative Constipation Protocol  Constipation - defined medically as fewer than three stools per week and severe constipation as less than one stool per week.  One of the most common issues patients have following surgery is constipation.  Even if you have a regular bowel pattern at home, your normal regimen is likely to be disrupted due to multiple reasons following surgery.  Combination of anesthesia, postoperative narcotics, change in appetite and fluid intake all can affect your bowels.  In order to avoid complications following surgery, here are some recommendations in order to help you during your recovery period.  Colace (docusate) - Pick up an over-the-counter form of Colace or another stool softener and take twice a day as long as you are requiring postoperative pain medications.  Take with a full glass of water daily.  If you experience loose stools or diarrhea, hold the colace until you stool forms back up.  If your symptoms do not get better within 1 week or if they get worse, check with your doctor.  Dulcolax (bisacodyl) - Pick up over-the-counter and take as directed by the product packaging as needed to assist with the movement of your bowels.  Take with a full glass of water.  Use this product as needed if not relieved by Colace only.   MiraLax (polyethylene glycol) - Pick up over-the-counter to have on hand.  MiraLax is a solution that will increase the amount of water in your bowels to assist with bowel movements.  Take as directed and can mix with a glass of water, juice, soda, coffee, or tea.  Take if you go more than two days without a movement. Do not use MiraLax more than once per day. Call your doctor if you are still  constipated or irregular after using this medication for 7 days in a row.  If you continue to have  problems with postoperative constipation, please contact the office for further assistance and recommendations.  If you experience "the worst abdominal pain ever" or develop nausea or vomiting, please contact the office immediatly for further recommendations for treatment.     Do not sit on low chairs, stoools or toilet seats, as it may be difficult to get up from low surfaces    Complete by:  As directed      Driving restrictions    Complete by:  As directed   No driving until released by the physician.     Increase activity slowly as tolerated    Complete by:  As directed      Lifting restrictions    Complete by:  As directed   No lifting until released by the physician.     Patient may shower    Complete by:  As directed   You may shower without a dressing once there is no drainage.  Do not wash over the wound.  If drainage remains, do not shower until drainage stops.     TED hose    Complete by:  As directed   Use stockings (TED hose) for 3 weeks on both leg(s).  You may remove them at night for sleeping.     Weight bearing as tolerated    Complete by:  As directed   Laterality:  right  Extremity:  Lower            Medication List    STOP taking these medications        CALCIUM 600 + D PO     celecoxib 200 MG capsule  Commonly known as:  CELEBREX     ibandronate 150 MG tablet  Commonly known as:  BONIVA     ibuprofen 200 MG tablet  Commonly known as:  ADVIL,MOTRIN     SYSTANE OP      TAKE these medications        acetaminophen 500 MG tablet  Commonly known as:  TYLENOL  Take 500-1,000 mg by mouth 2 (two) times daily as needed for moderate pain.     ALPRAZolam 0.5 MG tablet  Commonly known as:  XANAX  Take 0.5 mg by mouth 2 (two) times daily.     apixaban 2.5 MG Tabs tablet  Commonly known as:  ELIQUIS  - Take 1 tablet (2.5 mg total) by mouth 2 (two) times daily. Take Eliquis for two and a half more weeks, then discontinue Eliquis.  - Once the patient has  completed the blood thinner regimen, then take a Baby 81 mg Aspirin daily for three more weeks.     budesonide 3 MG 24 hr capsule  Commonly known as:  ENTOCORT EC  Take 3 mg by mouth every morning.     HYDROmorphone 2 MG tablet  Commonly known as:  DILAUDID  Take 1 tablet (2 mg total) by mouth every 4 (four) hours as needed for moderate pain or severe pain.     iron polysaccharides 150 MG capsule  Commonly known as:  NIFEREX  Take 1 capsule (150 mg total) by mouth daily.     levothyroxine 50 MCG tablet  Commonly known as:  SYNTHROID, LEVOTHROID  Take 50 mcg by mouth daily before breakfast.     loperamide 2 MG tablet  Commonly known as:  IMODIUM A-D  Take 2 mg by mouth 4 (  four) times daily as needed for diarrhea or loose stools.     methocarbamol 500 MG tablet  Commonly known as:  ROBAXIN  Take 1 tablet (500 mg total) by mouth every 6 (six) hours as needed for muscle spasms.     metoprolol 50 MG tablet  Commonly known as:  LOPRESSOR  Take 50 mg by mouth 2 (two) times daily.     pravastatin 80 MG tablet  Commonly known as:  PRAVACHOL  Take 80 mg by mouth every morning.     sertraline 100 MG tablet  Commonly known as:  ZOLOFT  Take 100 mg by mouth every morning.     temazepam 30 MG capsule  Commonly known as:  RESTORIL  Take 30 mg by mouth at bedtime.     traMADol 50 MG tablet  Commonly known as:  ULTRAM  Take 1-2 tablets (50-100 mg total) by mouth every 12 (twelve) hours as needed (mild to moderate pain).       Follow-up Information    Follow up with [facility].   Why:  home health physical therapy   Contact information:   [address] [place] [address] [phone]       Follow up with [facility].   Why:  rolling walker and 3n1 (commode)   Contact information:   [place] [address] [phone]       Follow up with Gearlean Alf, MD On 03/04/2015.   Specialty:  Orthopedic Surgery    Why:  Call office at [phone] to setup two week appointment with Dr. Denman George information:   [address] [place] [address] [phone]       Follow up with [facility].   Why:  home health physical therapy   Contact information:   [address] [place]  [address] [phone]       Signed: Arlee Muslim, PA-C Orthopaedic Surgery 02/27/2015, 9:08 AM

## 2015-02-22 LAB — CBC
HEMATOCRIT: 25.6 % — AB (ref 36.0–46.0)
Hemoglobin: 8.4 g/dL — ABNORMAL LOW (ref 12.0–15.0)
MCH: 33.9 pg (ref 26.0–34.0)
MCHC: 32.8 g/dL (ref 30.0–36.0)
MCV: 103.2 fL — ABNORMAL HIGH (ref 78.0–100.0)
PLATELETS: 129 10*3/uL — AB (ref 150–400)
RBC: 2.48 MIL/uL — ABNORMAL LOW (ref 3.87–5.11)
RDW: 13.2 % (ref 11.5–15.5)
WBC: 9.6 10*3/uL (ref 4.0–10.5)

## 2015-02-22 MED ORDER — SODIUM CHLORIDE 0.9 % IV BOLUS (SEPSIS)
250.0000 mL | Freq: Once | INTRAVENOUS | Status: AC
Start: 2015-02-22 — End: 2015-02-22
  Administered 2015-02-22: 250 mL via INTRAVENOUS

## 2015-02-22 NOTE — Progress Notes (Signed)
OT Cancellation Note  Patient Details Name: SCHERYL SANBORN MRN: [id number] DOB: 20-Jan-1935   Cancelled Treatment:    Reason Eval/Treat Not Completed: Other (comment) Note dizziness issues and didn't tolerate PT well. Will check on another time.  [place], [place] 02/22/2015, 3:14 PM

## 2015-02-22 NOTE — Progress Notes (Signed)
   Subjective: 3 Days Post-Op Procedure(s) (LRB): RIGHT TOTAL HIP ARTHROPLASTY ANTERIOR APPROACH (Right) Patient reports pain as mild and moderate.   Patient seen in rounds with Dr. Wynelle Link.  Hurting this morning.  Has not been up yet today.  Walked some yesterday with PT Patient is well, but has had some minor complaints of pain in the hip, requiring pain medications  Low pressures this morning.  Fluids today and recheck pressures.  Keep today and maybe tomorrow if does better and pressures improved.  Objective: Vital signs in last 24 hours: Temp:  [97.5 F (36.4 C)-99.6 F (37.6 C)] 98.2 F (36.8 C) (04/09 0500) Pulse Rate:  [78-88] 78 (04/09 0500) Resp:  [16] 16 (04/09 0500) BP: (98-146)/(36-94) 98/45 mmHg (04/09 0500) SpO2:  [94 %-100 %] 94 % (04/09 0500)  Intake/Output from previous day:  Intake/Output Summary (Last 24 hours) at 02/22/15 0704 Last data filed at 02/21/15 1829  Gross per 24 hour  Intake    240 ml  Output    700 ml  Net   -460 ml     Labs:  Recent Labs  02/20/15 0450 02/21/15 0500 02/22/15 0432  HGB 9.7* 9.0* 8.4*    Recent Labs  02/21/15 0500 02/22/15 0432  WBC 13.4* 9.6  RBC 2.62* 2.48*  HCT 26.7* 25.6*  PLT 146* 129*    Recent Labs  02/20/15 0450 02/21/15 0500  NA 138 140  K 4.5 4.2  CL 106 107  CO2 25 27  BUN 19 25*  CREATININE 1.18* 1.11*  GLUCOSE 144* 123*  CALCIUM 8.8 8.9   No results for input(s): LABPT, INR in the last 72 hours.  EXAM: General - Patient is Alert, Appropriate and Oriented Extremity - Neurovascular intact Sensation intact distally Dorsiflexion/Plantar flexion intact Incision - clean, dry, no drainage, healing Motor Function - intact, moving foot and toes well on exam.   Assessment/Plan: 3 Days Post-Op Procedure(s) (LRB): RIGHT TOTAL HIP ARTHROPLASTY ANTERIOR APPROACH (Right) Procedure(s) (LRB): RIGHT TOTAL HIP ARTHROPLASTY ANTERIOR APPROACH (Right) Past Medical History  Diagnosis Date  .  Hypertension   . Hypothyroidism   . Depression   . Anxiety   . Arthritis   . Crohn's disease   . Pneumonia   . Varicose veins   . Osteoporosis    Principal Problem:   OA (osteoarthritis) of hip  Estimated body mass index is 25.97 kg/(m^2) as calculated from the following:   Height as of this encounter: 5\' 2"  (1.575 m).   Weight as of this encounter: 64.411 kg (142 lb). Up with therapy Discharge home with home health Diet - Cardiac diet Follow up - in 2 weeks Activity - WBAT Disposition - Home Condition Upon Discharge - Pending D/C Meds - See DC Summary DVT Prophylaxis - Eliquis  Arlee Muslim, PA-C Orthopaedic Surgery 02/22/2015, 7:04 AM

## 2015-02-22 NOTE — Progress Notes (Signed)
Patient appears to be weak and complains of dizziness prior to standing. Patient states non-operative leg hurts, as well as operative leg which is contributing to her decrease in mobility. Patient is requiring max assist with ambulation. Encouraged patient to stay on top of her pain regimen. Will continue to monitor patient and notify MD if change in vital not notice post bolus.

## 2015-02-22 NOTE — Progress Notes (Signed)
Physical Therapy Treatment Patient Details Name: Diana Ramsey MRN: [id number] DOB: Jun 26, 1935 Today's Date: 02/22/2015    History of Present Illness Pt admitted for Rt THA (direct anterior approach)    PT Comments    Pt mobility continues to be limited by medical issues (feels "weak", dizzy upon standing); pt has had fluids and BP slightly better but still with above symptoms, Hgb 8.4 today; RN aware; will continue to follow  Follow Up Recommendations  Home health PT;Supervision for mobility/OOB     Equipment Recommendations  Rolling walker with 5" wheels    Recommendations for Other Services OT consult     Precautions / Restrictions Precautions Precautions: None Restrictions Weight Bearing Restrictions: No Other Position/Activity Restrictions: WBAT    Mobility  Bed Mobility Overal bed mobility: Needs Assistance Bed Mobility: Sit to Supine       Sit to supine: Mod assist   General bed mobility comments: assist for R LE, trunk guided to supine, increased time  Transfers Overall transfer level: Needs assistance Equipment used: Rolling walker (2 wheeled) Transfers: Sit to/from Stand Sit to Stand: Min assist;Mod assist Stand pivot transfers: +2 safety/equipment;Min assist       General transfer comment: sit-->stand transfer repeated  x 5; pt with weakness, difficulty coming to and maintaining full upright, difficulty with knee extension; requires incr time and cues throughout for safety; pt sat once without warning d/t wekness but PT had chair behind her to prevent fall  Ambulation/Gait Ambulation/Gait assistance:  (unable to amb d/t weakness)               Stairs            Wheelchair Mobility    Modified Rankin (Stroke Patients Only)       Balance                                    Cognition Arousal/Alertness: Awake/alert Behavior During Therapy: WFL for tasks assessed/performed Overall Cognitive Status: Within Functional  Limits for tasks assessed                      Exercises Total Joint Exercises Ankle Circles/Pumps: AROM;Both;15 reps;Supine Quad Sets: AROM;Both;5 reps;Supine Heel Slides: AAROM;Right;10 reps;Supine Hip ABduction/ADduction: AAROM;Right;10 reps;Supine    General Comments        Pertinent Vitals/Pain Pain Assessment: 0-10 Pain Score: 2  Pain Location: R hip Pain Descriptors / Indicators: Sore Pain Intervention(s): Limited activity within patient's tolerance;Monitored during session;Repositioned    Home Living                      Prior Function            PT Goals (current goals can now be found in the care plan section) Acute Rehab PT Goals Patient Stated Goal: to regain independence  PT Goal Formulation: With patient Time For Goal Achievement: 02/27/15 Potential to Achieve Goals: Good Progress towards PT goals: Progressing toward goals    Frequency  7X/week    PT Plan Current plan remains appropriate    Co-evaluation             End of Session Equipment Utilized During Treatment: Gait belt Activity Tolerance: Patient limited by fatigue Patient left: in bed;with call bell/phone within reach;with family/visitor present     Time: 1218-1232 PT Time Calculation (min) (ACUTE ONLY): 14 min  Charges:  $Therapeutic Activity: 8-22  mins                    G Codes:      Elyzabeth Goatley 03-04-15, 12:36 PM

## 2015-02-23 LAB — BASIC METABOLIC PANEL
ANION GAP: 10 (ref 5–15)
BUN: 12 mg/dL (ref 6–23)
CALCIUM: 8.9 mg/dL (ref 8.4–10.5)
CO2: 25 mmol/L (ref 19–32)
Chloride: 105 mmol/L (ref 96–112)
Creatinine, Ser: 1.03 mg/dL (ref 0.50–1.10)
GFR calc non Af Amer: 51 mL/min — ABNORMAL LOW (ref >90)
GFR, EST AFRICAN AMERICAN: 59 mL/min — AB (ref >90)
Glucose, Bld: 102 mg/dL — ABNORMAL HIGH (ref 70–99)
POTASSIUM: 3.4 mmol/L — AB (ref 3.5–5.1)
Sodium: 140 mmol/L (ref 135–145)

## 2015-02-23 LAB — CBC
HCT: 28.6 % — ABNORMAL LOW (ref 36.0–46.0)
Hemoglobin: 9.1 g/dL — ABNORMAL LOW (ref 12.0–15.0)
MCH: 33.3 pg (ref 26.0–34.0)
MCHC: 31.8 g/dL (ref 30.0–36.0)
MCV: 104.8 fL — ABNORMAL HIGH (ref 78.0–100.0)
Platelets: 186 10*3/uL (ref 150–400)
RBC: 2.73 MIL/uL — AB (ref 3.87–5.11)
RDW: 13.2 % (ref 11.5–15.5)
WBC: 10.4 10*3/uL (ref 4.0–10.5)

## 2015-02-23 LAB — GLUCOSE, CAPILLARY: Glucose-Capillary: 115 mg/dL — ABNORMAL HIGH (ref 70–99)

## 2015-02-23 MED ORDER — POTASSIUM CHLORIDE CRYS ER 20 MEQ PO TBCR
40.0000 meq | EXTENDED_RELEASE_TABLET | Freq: Two times a day (BID) | ORAL | Status: AC
  Administered 2015-02-23 – 2015-02-24 (×2): 40 meq via ORAL
  Filled 2015-02-23 (×2): qty 2

## 2015-02-23 MED ORDER — POLYSACCHARIDE IRON COMPLEX 150 MG PO CAPS
150.0000 mg | ORAL_CAPSULE | Freq: Every day | ORAL | Status: DC
  Administered 2015-02-23 – 2015-02-24 (×2): 150 mg via ORAL
  Filled 2015-02-23 (×2): qty 1

## 2015-02-23 NOTE — Progress Notes (Signed)
Physical Therapy Treatment Patient Details Name: Diana Ramsey MRN: [id number] DOB: 26-Jan-1935 Today's Date: 02/23/2015    History of Present Illness 80 yo female s/p R THA direct anterior) PMH: HTN, depression, anxiety, arthritis, PNA, Crohns' disease, osteoporosis    PT Comments    Pt able to incr amb distance today; would likely benefit from staying today, one more session with PT  today and prior to D/C tomorrow; Pt will have assist of son at home but needs to be closer to mod I/distant supervision for safety at home   Follow Up Recommendations  Home health PT;Supervision for mobility/OOB     Equipment Recommendations  Rolling walker with 5" wheels    Recommendations for Other Services       Precautions / Restrictions Precautions Precautions: None Restrictions Weight Bearing Restrictions: No Other Position/Activity Restrictions: WBAT    Mobility  Bed Mobility Overal bed mobility: Needs Assistance Bed Mobility: Supine to Sit     Supine to sit: Min guard     General bed mobility comments: min/guard to initiate movement RLE  Transfers Overall transfer level: Needs assistance Equipment used: Rolling walker (2 wheeled) Transfers: Sit to/from Stand Sit to Stand: Supervision;Min guard         General transfer comment: cues for hand placement and RLE position, min/guard d/t pt with BP issues and quickly sat  down yesterday  Ambulation/Gait Ambulation/Gait assistance: Min guard Ambulation Distance (Feet): 70 Feet Assistive device: Rolling walker (2 wheeled) Gait Pattern/deviations: Step-to pattern;Trunk flexed Gait velocity: decreased   General Gait Details: cues for posture, sequence, RW position   Stairs            Wheelchair Mobility    Modified Rankin (Stroke Patients Only)       Balance             Standing balance-Leahy Scale: Fair (pt can only maintain balance briefly without bil UE support)                       Cognition Arousal/Alertness: Awake/alert Behavior During Therapy: WFL for tasks assessed/performed Overall Cognitive Status: Within Functional Limits for tasks assessed                      Exercises Total Joint Exercises Ankle Circles/Pumps: AROM;Both;15 reps;Supine Quad Sets: AROM;Both;Supine;10 reps Heel Slides: AAROM;Right;10 reps;Supine Hip ABduction/ADduction: AAROM;Right;10 reps;Supine    General Comments        Pertinent Vitals/Pain Pain Assessment: 0-10 Pain Score: 3  Pain Location: R hip Pain Descriptors / Indicators: Sore Pain Intervention(s): Limited activity within patient's tolerance;Monitored during session;Premedicated before session;Ice applied    Home Living                      Prior Function            PT Goals (current goals can now be found in the care plan section) Acute Rehab PT Goals Patient Stated Goal: to regain independence  PT Goal Formulation: With patient Time For Goal Achievement: 02/27/15 Potential to Achieve Goals: Good Progress towards PT goals: Progressing toward goals    Frequency  7X/week    PT Plan Current plan remains appropriate    Co-evaluation             End of Session Equipment Utilized During Treatment: Gait belt Activity Tolerance: Patient tolerated treatment well Patient left: in chair;with call bell/phone within reach     Time: 3567-0141 PT Time  Calculation (min) (ACUTE ONLY): 16 min  Charges:  $Gait Training: 8-22 mins                    G Codes:      Diana Ramsey 03/19/15, 1:06 PM

## 2015-02-23 NOTE — Care Management Note (Signed)
CARE MANAGEMENT NOTE 02/23/2015  Patient:  Diana Ramsey, Diana Ramsey   Account Number:  [id number]  Date Initiated:  02/20/2015  Documentation initiated by:  [facility]  Subjective/Objective Assessment:   adm: RIGHT TOTAL HIP ARTHROPLASTY ANTERIOR APPROACH (Right)     Action/Plan:   discharge planning   Anticipated DC Date:  02/21/2015   Anticipated DC Plan:  [place]  CM consult      [facility] Choice  HOME HEALTH   Choice offered to / List presented to:  C-1 Patient   DME arranged  3-N-1  Vassie Moselle      DME agency  [place] arranged  [place]   Status of service:  Completed, signed off Medicare Important Message given?   (If response is "NO", the following Medicare IM given date fields will be blank) Date Medicare IM given:   Medicare IM given by:   Date Additional Medicare IM given:   Additional Medicare IM given by:    Discharge Disposition:  [place]  Per UR Regulation:    If discussed at Long Length of Stay Meetings, dates discussed:    Comments:  02/23/15 1030 - CM spoke with patient at the bedside. Has received DME. No additional needs. Venita Sheffield RN BSN CCM [phone]  02/20/15 10:30 Cm met with pt in room to offer choice of home health agency.  Pt chooses [facility] to render HHPT.  Address and contact information verified by  pt.  Referral emailed to [employer], Tim.  CM called [facility] DME rep, Lecretia to please deliver the 3n1 and rolling walker to room.  No other CM needs were communicated.  Mariane Masters, BSN, Cm [phone].

## 2015-02-23 NOTE — Progress Notes (Signed)
   Subjective: 4 Days Post-Op Procedure(s) (LRB): RIGHT TOTAL HIP ARTHROPLASTY ANTERIOR APPROACH (Right) Patient reports pain as mild and moderate.   Patient seen in rounds with Dr. Wynelle Link.  Main complaint is nausea.  Will DC the Dilaudid medication. Patient is well, but has had some minor complaints of pain in the thigh, requiring pain medications We will resume therapy today.  Plan is to go Home after hospital stay.  Objective: Vital signs in last 24 hours: Temp:  [98.8 F (37.1 C)-100 F (37.8 C)] 98.8 F (37.1 C) (04/10 0520) Pulse Rate:  [61-95] 95 (04/10 0520) Resp:  [18-24] 18 (04/10 0520) BP: (110-143)/(41-65) 110/41 mmHg (04/10 0520) SpO2:  [95 %-100 %] 95 % (04/10 0520)  Intake/Output from previous day:  Intake/Output Summary (Last 24 hours) at 02/23/15 0745 Last data filed at 02/23/15 0003  Gross per 24 hour  Intake    600 ml  Output   1300 ml  Net   -700 ml    Labs:  Recent Labs  02/21/15 0500 02/22/15 0432 02/23/15 0500  HGB 9.0* 8.4* 9.1*    Recent Labs  02/22/15 0432 02/23/15 0500  WBC 9.6 10.4  RBC 2.48* 2.73*  HCT 25.6* 28.6*  PLT 129* 186    Recent Labs  02/21/15 0500 02/23/15 0500  NA 140 140  K 4.2 3.4*  CL 107 105  CO2 27 25  BUN 25* 12  CREATININE 1.11* 1.03  GLUCOSE 123* 102*  CALCIUM 8.9 8.9   No results for input(s): LABPT, INR in the last 72 hours.  EXAM General - Patient is Alert, Appropriate and Oriented Extremity - Neurovascular intact Sensation intact distally No cellulitis present Dressing - dressing C/D/I Motor Function - intact, moving foot and toes well on exam.   Past Medical History  Diagnosis Date  . Hypertension   . Hypothyroidism   . Depression   . Anxiety   . Arthritis   . Crohn's disease   . Pneumonia   . Varicose veins   . Osteoporosis     Assessment/Plan: 4 Days Post-Op Procedure(s) (LRB): RIGHT TOTAL HIP ARTHROPLASTY ANTERIOR APPROACH (Right) Principal Problem:   OA (osteoarthritis)  of hip  Estimated body mass index is 25.97 kg/(m^2) as calculated from the following:   Height as of this encounter: 5\' 2"  (1.575 m).   Weight as of this encounter: 64.411 kg (142 lb). Up with therapy Plan for discharge tomorrow Discharge home with home health  DVT Prophylaxis - Eliquis Weight Bearing As Tolerated right Leg DC Dilaudid Work on pain control and nausea  Arlee Muslim, PA-C Orthopaedic Surgery 02/23/2015, 7:45 AM

## 2015-02-23 NOTE — Progress Notes (Signed)
Occupational Therapy Treatment Patient Details Name: Diana Ramsey MRN: [id number] DOB: 1935-04-26 Today's Date: 02/23/2015    History of present illness 80 yo female s/p R THA direct anterior) PMH: HTN, depression, anxiety, arthritis, PNA, Crohns' disease, osteoporosis   OT comments  Pt able to complete bed mobility, toilet transfer in narrowed space and chair transfer this session. Pt will have son's (A) upon d/c home. Pt will need incr time but able to complete task supervision level. Pt is at adequate level for d/c home from OT standpoint. Pt very motivated to d/c home later today.     Follow Up Recommendations  No OT follow up;Supervision - Intermittent    Equipment Recommendations  3 in 1 bedside comode (present in room this session)    Recommendations for Other Services      Precautions / Restrictions Precautions Precautions: None       Mobility Bed Mobility Overal bed mobility: Needs Assistance Bed Mobility: Supine to Sit     Supine to sit: Supervision     General bed mobility comments: Pt with HOB < 20 degrees no bed rails and MOd cues. pt able to complete with incr time. Pt very pleased with completing task without physical (A)  Transfers Overall transfer level: Needs assistance Equipment used: Rolling walker (2 wheeled) Transfers: Sit to/from Stand Sit to Stand: Supervision         General transfer comment: cues for hand placement with RW but able to complete task without (A). pt cues to R LE placement    Balance Overall balance assessment: Needs assistance Sitting-balance support: Bilateral upper extremity supported;Feet supported Sitting balance-Leahy Scale: Good     Standing balance support: No upper extremity supported;During functional activity Standing balance-Leahy Scale: Good                     ADL Overall ADL's : Needs assistance/impaired Eating/Feeding: Independent   Grooming: Wash/dry hands;Supervision/safety;Standing                   Toilet Transfer: Supervision/safety;Ambulation;RW;BSC   Toileting- Water quality scientist and Hygiene: Supervision/safety;Sit to/from stand       Functional mobility during ADLs: Supervision/safety;Rolling walker General ADL Comments: Pt completed bed mobility with cues (see below). Pt reports needing to be able to navigate home with RW in tight spaces. Pt practiced side stepping with bathroom transfer. Pt able to complete 3n1 transfer with cues for hand placement      Vision                     Perception     Praxis      Cognition   Behavior During Therapy: [facility] for tasks assessed/performed Overall Cognitive Status: Within Functional Limits for tasks assessed                       Extremity/Trunk Assessment               Exercises     Shoulder Instructions       General Comments      Pertinent Vitals/ Pain       Pain Assessment: Faces Faces Pain Scale: Hurts little more Pain Location: R hip Pain Descriptors / Indicators: Dull Pain Intervention(s): Repositioned;Premedicated before session  Home Living  Prior Functioning/Environment              Frequency Min 2X/week     Progress Toward Goals  OT Goals(current goals can now be found in the care plan section)  Progress towards OT goals: Progressing toward goals  Acute Rehab OT Goals Patient Stated Goal: to regain independence  OT Goal Formulation: With patient Time For Goal Achievement: 02/27/15 Potential to Achieve Goals: Good ADL Goals Pt Will Perform Grooming: with supervision;standing (MET) Pt Will Perform Lower Body Bathing: with supervision;sit to/from stand Pt Will Perform Lower Body Dressing: with supervision;sit to/from stand Pt Will Transfer to Toilet: with supervision;ambulating;regular height toilet;bedside commode;grab bars (MET) Pt Will Perform Toileting - Clothing Manipulation and  hygiene: with supervision;sit to/from stand (MET) Pt Will Perform Tub/Shower Transfer: Tub transfer;with min guard assist;ambulating;shower seat;rolling walker  Plan Discharge plan remains appropriate    Co-evaluation                 End of Session Equipment Utilized During Treatment: Rolling walker   Activity Tolerance Patient tolerated treatment well   Patient Left in chair;with call bell/phone within reach   Nurse Communication Mobility status;Precautions        Time: 0835-0852 OT Time Calculation (min): 17 min  Charges: OT General Charges $OT Visit: 1 Procedure OT Treatments $Self Care/Home Management : 8-22 mins  ,  B 02/23/2015, 9:05 AM Pager: [phone]    

## 2015-02-23 NOTE — Progress Notes (Signed)
   02/23/15 1400  PT Visit Information  Last PT Received On 02/23/15  Assistance Needed +1  History of Present Illness 80 yo female s/p R THA direct anterior) PMH: HTN, depression, anxiety, arthritis, PNA, Crohns' disease, osteoporosis  PT Time Calculation  PT Start Time (ACUTE ONLY) 1333  PT Stop Time (ACUTE ONLY) 1353  PT Time Calculation (min) (ACUTE ONLY) 20 min  Subjective Data  Patient Stated Goal to regain independence   Precautions  Precautions None  Restrictions  Other Position/Activity Restrictions WBAT  Pain Assessment  Pain Assessment 0-10  Pain Score 3  Pain Location R hip  Pain Descriptors / Indicators Sore  Pain Intervention(s) Limited activity within patient's tolerance;Monitored during session  Cognition  Arousal/Alertness Awake/alert  Behavior During Therapy WFL for tasks assessed/performed  Overall Cognitive Status Within Functional Limits for tasks assessed  Bed Mobility  Overal bed mobility Needs Assistance  Bed Mobility Supine to Sit;Sit to Supine  Supine to sit Min guard  Sit to supine Min guard  General bed mobility comments min/guard to initiate movement RLE on and off bed  Transfers  Overall transfer level Needs assistance  Equipment used Rolling walker (2 wheeled)  Transfers Sit to/from Stand  Sit to Stand Supervision;Min guard  Ambulation/Gait  Ambulation/Gait assistance Min guard  Ambulation Distance (Feet) 50 Feet (10' more)  Assistive device Rolling walker (2 wheeled)  Gait Pattern/deviations Step-to pattern;Trunk flexed  Gait velocity decreased  General Gait Details cues for posture, sequence, RW position  PT - End of Session  Equipment Utilized During Treatment Gait belt  Activity Tolerance Patient tolerated treatment well  Patient left in bed;with call bell/phone within reach  Nurse Communication Mobility status  PT - Assessment/Plan  PT Plan Current plan remains appropriate  PT Frequency (ACUTE ONLY) 7X/week  Recommendations for  Other Services OT consult  Follow Up Recommendations Home health PT;Supervision for mobility/OOB  PT equipment Rolling walker with 5" wheels  PT Goal Progression  Progress towards PT goals Progressing toward goals  Acute Rehab PT Goals  PT Goal Formulation With patient  Time For Goal Achievement 02/27/15  Potential to Achieve Goals Good  PT General Charges  $$ ACUTE PT VISIT 1 Procedure  PT Treatments  $Gait Training 8-22 mins

## 2015-02-24 LAB — BASIC METABOLIC PANEL
Anion gap: 8 (ref 5–15)
BUN: 14 mg/dL (ref 6–23)
CHLORIDE: 106 mmol/L (ref 96–112)
CO2: 26 mmol/L (ref 19–32)
Calcium: 8.9 mg/dL (ref 8.4–10.5)
Creatinine, Ser: 0.97 mg/dL (ref 0.50–1.10)
GFR calc non Af Amer: 55 mL/min — ABNORMAL LOW (ref >90)
GFR, EST AFRICAN AMERICAN: 64 mL/min — AB (ref >90)
GLUCOSE: 109 mg/dL — AB (ref 70–99)
POTASSIUM: 4.2 mmol/L (ref 3.5–5.1)
SODIUM: 140 mmol/L (ref 135–145)

## 2015-02-24 MED ORDER — POLYSACCHARIDE IRON COMPLEX 150 MG PO CAPS: 150.0000 mg | ORAL_CAPSULE | Freq: Every day | ORAL | Status: DC

## 2015-02-24 NOTE — Anesthesia Postprocedure Evaluation (Addendum)
  Anesthesia Post-op Note  Patient: Diana Ramsey  Procedure(s) Performed: Procedure(s) (LRB): RIGHT TOTAL HIP ARTHROPLASTY ANTERIOR APPROACH (Right)  Patient Location: PACU  Anesthesia Type: General  Level of Consciousness: awake and alert   Airway and Oxygen Therapy: Patient Spontanous Breathing  Post-op Pain: mild  Post-op Assessment: Post-op Vital signs reviewed, Patient's Cardiovascular Status Stable, Respiratory Function Stable, Patent Airway and No signs of Nausea or vomiting  Last Vitals:  Filed Vitals:   02/24/15 0423  BP: 125/47  Pulse: 83  Temp: 36.7 C  Resp: 20    Post-op Vital Signs: stable   Complications: No apparent anesthesia complications

## 2015-02-24 NOTE — Progress Notes (Signed)
Physical Therapy Treatment Patient Details Name: Diana Ramsey MRN: [id number] DOB: 28-Jul-1935 Today's Date: 2015-03-25    History of Present Illness 80 yo female s/p R THA direct anterior) PMH: HTN, depression, anxiety, arthritis, PNA, Crohns' disease, osteoporosis    PT Comments    Pt progressing well  Follow Up Recommendations  Home health PT;Supervision for mobility/OOB     Equipment Recommendations  Rolling walker with 5" wheels    Recommendations for Other Services       Precautions / Restrictions Precautions Precautions: None Restrictions Weight Bearing Restrictions: No Other Position/Activity Restrictions: WBAT    Mobility  Bed Mobility                  Transfers Overall transfer level: Needs assistance Equipment used: Rolling walker (2 wheeled) Transfers: Sit to/from Stand Sit to Stand: Supervision;Min guard         General transfer comment: cues for hand placement  Ambulation/Gait Ambulation/Gait assistance: Supervision;Min guard Ambulation Distance (Feet): 40 Feet Assistive device: Rolling walker (2 wheeled) Gait Pattern/deviations: Step-to pattern Gait velocity: decreased   General Gait Details: cues for posture, min/guard to close supervision for safety   Stairs Stairs: Yes Stairs assistance: Min assist Stair Management: No rails;Step to pattern;Backwards;With walker Number of Stairs: 2 General stair comments: cues for sequence, RW position; min for balance and use of RW  Wheelchair Mobility    Modified Rankin (Stroke Patients Only)       Balance           Standing balance support: No upper extremity supported;During functional activity Standing balance-Leahy Scale: Fair                      Cognition Arousal/Alertness: Awake/alert Behavior During Therapy: WFL for tasks assessed/performed Overall Cognitive Status: Within Functional Limits for tasks assessed                      Exercises Total  Joint Exercises Ankle Circles/Pumps: AROM;Both;15 reps;Supine Quad Sets: AROM;Both;Supine;10 reps Heel Slides: AAROM;Right;10 reps;Supine Hip ABduction/ADduction: AAROM;Right;10 reps;Supine    General Comments        Pertinent Vitals/Pain Pain Assessment: 0-10 Pain Score: 2  Pain Location: R hip Pain Descriptors / Indicators: Aching Pain Intervention(s): Limited activity within patient's tolerance;Monitored during session    Home Living                      Prior Function            PT Goals (current goals can now be found in the care plan section) Acute Rehab PT Goals Patient Stated Goal: to regain independence  PT Goal Formulation: With patient Time For Goal Achievement: 02/27/15 Potential to Achieve Goals: Good Progress towards PT goals: Progressing toward goals    Frequency  7X/week    PT Plan Current plan remains appropriate    Co-evaluation             End of Session Equipment Utilized During Treatment: Gait belt Activity Tolerance: Patient tolerated treatment well Patient left: in chair;with call bell/phone within reach;with family/visitor present     Time: 7672-0947 PT Time Calculation (min) (ACUTE ONLY): 18 min  Charges:  $Gait Training: 8-22 mins                    G Codes:      Diana Ramsey 25-Mar-2015, 11:45 AM

## 2015-02-24 NOTE — Progress Notes (Signed)
   Subjective: 5 Days Post-Op Procedure(s) (LRB): RIGHT TOTAL HIP ARTHROPLASTY ANTERIOR APPROACH (Right) Patient reports pain as mild.   Patient seen in rounds for Dr. Wynelle Link. Patient is well, and has had no acute complaints or problems Patient is ready to go home  Objective: Vital signs in last 24 hours: Temp:  [98 F (36.7 C)-98.8 F (37.1 C)] 98 F (36.7 C) (04/11 0423) Pulse Rate:  [83-90] 83 (04/11 0423) Resp:  [18-20] 20 (04/11 0423) BP: (120-129)/(46-47) 125/47 mmHg (04/11 0423) SpO2:  [95 %-97 %] 96 % (04/11 0423)  Intake/Output from previous day:  Intake/Output Summary (Last 24 hours) at 02/24/15 1021 Last data filed at 02/24/15 0000  Gross per 24 hour  Intake    480 ml  Output      0 ml  Net    480 ml    Labs:  Recent Labs  02/22/15 0432 02/23/15 0500  HGB 8.4* 9.1*    Recent Labs  02/22/15 0432 02/23/15 0500  WBC 9.6 10.4  RBC 2.48* 2.73*  HCT 25.6* 28.6*  PLT 129* 186    Recent Labs  02/23/15 0500 02/24/15 0438  NA 140 140  K 3.4* 4.2  CL 105 106  CO2 25 26  BUN 12 14  CREATININE 1.03 0.97  GLUCOSE 102* 109*  CALCIUM 8.9 8.9   No results for input(s): LABPT, INR in the last 72 hours.  EXAM: General - Patient is Alert, Appropriate and Oriented Extremity - Neurovascular intact Sensation intact distally Dorsiflexion/Plantar flexion intact Incision - clean, dry, no drainage, healing Motor Function - intact, moving foot and toes well on exam.   Assessment/Plan: 5 Days Post-Op Procedure(s) (LRB): RIGHT TOTAL HIP ARTHROPLASTY ANTERIOR APPROACH (Right) Procedure(s) (LRB): RIGHT TOTAL HIP ARTHROPLASTY ANTERIOR APPROACH (Right) Past Medical History  Diagnosis Date  . Hypertension   . Hypothyroidism   . Depression   . Anxiety   . Arthritis   . Crohn's disease   . Pneumonia   . Varicose veins   . Osteoporosis    Principal Problem:   OA (osteoarthritis) of hip  Estimated body mass index is 25.97 kg/(m^2) as calculated from the  following:   Height as of this encounter: 5\' 2"  (1.575 m).   Weight as of this encounter: 64.411 kg (142 lb). Up with therapy Discharge home with home health Diet - Cardiac diet Follow up - in 2 weeks Activity - WBAT Disposition - Home Condition Upon Discharge - Good D/C Meds - See DC Summary DVT Prophylaxis - Eliquis   Arlee Muslim, PA-C Orthopaedic Surgery 02/24/2015, 10:21 AM

## 2015-02-24 NOTE — Progress Notes (Signed)
RN reviewed discharge instructions with patient and family. All questions answered.  Paperwork and prescriptions given.   NT rolled patient down in wheelchair to family car.  

## 2015-02-24 NOTE — Progress Notes (Signed)
Bp Diastolic cont to be in the 40's range >48hrs.HR 61-90  Metoprolol held per pt request,c/o still feeling some weakness when oob but much improved today.K-pad and pain po meds given for c/o mid chronic back pain with relief.Aggie Moats D

## 2015-02-25 DIAGNOSIS — I10 Essential (primary) hypertension: Secondary | ICD-10-CM | POA: Diagnosis not present

## 2015-02-25 DIAGNOSIS — G629 Polyneuropathy, unspecified: Secondary | ICD-10-CM | POA: Diagnosis not present

## 2015-02-25 DIAGNOSIS — M1612 Unilateral primary osteoarthritis, left hip: Secondary | ICD-10-CM | POA: Diagnosis not present

## 2015-02-25 DIAGNOSIS — F419 Anxiety disorder, unspecified: Secondary | ICD-10-CM | POA: Diagnosis not present

## 2015-02-25 DIAGNOSIS — F329 Major depressive disorder, single episode, unspecified: Secondary | ICD-10-CM | POA: Diagnosis not present

## 2015-02-25 DIAGNOSIS — Z471 Aftercare following joint replacement surgery: Secondary | ICD-10-CM | POA: Diagnosis not present

## 2015-02-27 DIAGNOSIS — M1612 Unilateral primary osteoarthritis, left hip: Secondary | ICD-10-CM | POA: Diagnosis not present

## 2015-02-27 DIAGNOSIS — I10 Essential (primary) hypertension: Secondary | ICD-10-CM | POA: Diagnosis not present

## 2015-02-27 DIAGNOSIS — Z471 Aftercare following joint replacement surgery: Secondary | ICD-10-CM | POA: Diagnosis not present

## 2015-02-27 DIAGNOSIS — F419 Anxiety disorder, unspecified: Secondary | ICD-10-CM | POA: Diagnosis not present

## 2015-02-27 DIAGNOSIS — G629 Polyneuropathy, unspecified: Secondary | ICD-10-CM | POA: Diagnosis not present

## 2015-02-27 DIAGNOSIS — F329 Major depressive disorder, single episode, unspecified: Secondary | ICD-10-CM | POA: Diagnosis not present

## 2015-03-03 DIAGNOSIS — I10 Essential (primary) hypertension: Secondary | ICD-10-CM | POA: Diagnosis not present

## 2015-03-03 DIAGNOSIS — F419 Anxiety disorder, unspecified: Secondary | ICD-10-CM | POA: Diagnosis not present

## 2015-03-03 DIAGNOSIS — Z471 Aftercare following joint replacement surgery: Secondary | ICD-10-CM | POA: Diagnosis not present

## 2015-03-03 DIAGNOSIS — M1612 Unilateral primary osteoarthritis, left hip: Secondary | ICD-10-CM | POA: Diagnosis not present

## 2015-03-03 DIAGNOSIS — G629 Polyneuropathy, unspecified: Secondary | ICD-10-CM | POA: Diagnosis not present

## 2015-03-03 DIAGNOSIS — F329 Major depressive disorder, single episode, unspecified: Secondary | ICD-10-CM | POA: Diagnosis not present

## 2015-03-04 DIAGNOSIS — Z96641 Presence of right artificial hip joint: Secondary | ICD-10-CM | POA: Diagnosis not present

## 2015-03-04 DIAGNOSIS — Z471 Aftercare following joint replacement surgery: Secondary | ICD-10-CM | POA: Diagnosis not present

## 2015-03-05 DIAGNOSIS — F329 Major depressive disorder, single episode, unspecified: Secondary | ICD-10-CM | POA: Diagnosis not present

## 2015-03-05 DIAGNOSIS — F419 Anxiety disorder, unspecified: Secondary | ICD-10-CM | POA: Diagnosis not present

## 2015-03-05 DIAGNOSIS — M1612 Unilateral primary osteoarthritis, left hip: Secondary | ICD-10-CM | POA: Diagnosis not present

## 2015-03-05 DIAGNOSIS — I10 Essential (primary) hypertension: Secondary | ICD-10-CM | POA: Diagnosis not present

## 2015-03-05 DIAGNOSIS — Z471 Aftercare following joint replacement surgery: Secondary | ICD-10-CM | POA: Diagnosis not present

## 2015-03-05 DIAGNOSIS — G629 Polyneuropathy, unspecified: Secondary | ICD-10-CM | POA: Diagnosis not present

## 2015-03-07 DIAGNOSIS — F419 Anxiety disorder, unspecified: Secondary | ICD-10-CM | POA: Diagnosis not present

## 2015-03-07 DIAGNOSIS — Z471 Aftercare following joint replacement surgery: Secondary | ICD-10-CM | POA: Diagnosis not present

## 2015-03-07 DIAGNOSIS — F329 Major depressive disorder, single episode, unspecified: Secondary | ICD-10-CM | POA: Diagnosis not present

## 2015-03-07 DIAGNOSIS — M1612 Unilateral primary osteoarthritis, left hip: Secondary | ICD-10-CM | POA: Diagnosis not present

## 2015-03-07 DIAGNOSIS — I10 Essential (primary) hypertension: Secondary | ICD-10-CM | POA: Diagnosis not present

## 2015-03-07 DIAGNOSIS — G629 Polyneuropathy, unspecified: Secondary | ICD-10-CM | POA: Diagnosis not present

## 2015-03-12 DIAGNOSIS — M25651 Stiffness of right hip, not elsewhere classified: Secondary | ICD-10-CM | POA: Diagnosis not present

## 2015-03-12 DIAGNOSIS — M25551 Pain in right hip: Secondary | ICD-10-CM | POA: Diagnosis not present

## 2015-03-17 DIAGNOSIS — M25651 Stiffness of right hip, not elsewhere classified: Secondary | ICD-10-CM | POA: Diagnosis not present

## 2015-03-17 DIAGNOSIS — M25551 Pain in right hip: Secondary | ICD-10-CM | POA: Diagnosis not present

## 2015-03-20 DIAGNOSIS — M25651 Stiffness of right hip, not elsewhere classified: Secondary | ICD-10-CM | POA: Diagnosis not present

## 2015-03-20 DIAGNOSIS — M25551 Pain in right hip: Secondary | ICD-10-CM | POA: Diagnosis not present

## 2015-03-24 DIAGNOSIS — M25551 Pain in right hip: Secondary | ICD-10-CM | POA: Diagnosis not present

## 2015-03-24 DIAGNOSIS — M25651 Stiffness of right hip, not elsewhere classified: Secondary | ICD-10-CM | POA: Diagnosis not present

## 2015-03-26 DIAGNOSIS — M25551 Pain in right hip: Secondary | ICD-10-CM | POA: Diagnosis not present

## 2015-03-26 DIAGNOSIS — M25651 Stiffness of right hip, not elsewhere classified: Secondary | ICD-10-CM | POA: Diagnosis not present

## 2015-04-01 DIAGNOSIS — Z471 Aftercare following joint replacement surgery: Secondary | ICD-10-CM | POA: Diagnosis not present

## 2015-04-01 DIAGNOSIS — Z96641 Presence of right artificial hip joint: Secondary | ICD-10-CM | POA: Diagnosis not present

## 2015-04-09 DIAGNOSIS — M25651 Stiffness of right hip, not elsewhere classified: Secondary | ICD-10-CM | POA: Diagnosis not present

## 2015-04-09 DIAGNOSIS — M25551 Pain in right hip: Secondary | ICD-10-CM | POA: Diagnosis not present

## 2015-04-16 DIAGNOSIS — M25551 Pain in right hip: Secondary | ICD-10-CM | POA: Diagnosis not present

## 2015-04-16 DIAGNOSIS — M25651 Stiffness of right hip, not elsewhere classified: Secondary | ICD-10-CM | POA: Diagnosis not present

## 2015-04-18 DIAGNOSIS — M25651 Stiffness of right hip, not elsewhere classified: Secondary | ICD-10-CM | POA: Diagnosis not present

## 2015-04-18 DIAGNOSIS — M25551 Pain in right hip: Secondary | ICD-10-CM | POA: Diagnosis not present

## 2015-05-13 DIAGNOSIS — Z471 Aftercare following joint replacement surgery: Secondary | ICD-10-CM | POA: Diagnosis not present

## 2015-05-13 DIAGNOSIS — Z96641 Presence of right artificial hip joint: Secondary | ICD-10-CM | POA: Diagnosis not present

## 2015-05-14 DIAGNOSIS — Z1231 Encounter for screening mammogram for malignant neoplasm of breast: Secondary | ICD-10-CM | POA: Diagnosis not present

## 2015-05-15 DIAGNOSIS — K5289 Other specified noninfective gastroenteritis and colitis: Secondary | ICD-10-CM | POA: Diagnosis not present

## 2015-05-26 DIAGNOSIS — N3090 Cystitis, unspecified without hematuria: Secondary | ICD-10-CM | POA: Diagnosis not present

## 2015-05-26 DIAGNOSIS — R300 Dysuria: Secondary | ICD-10-CM | POA: Diagnosis not present

## 2015-06-12 DIAGNOSIS — I10 Essential (primary) hypertension: Secondary | ICD-10-CM | POA: Diagnosis not present

## 2015-06-12 DIAGNOSIS — F418 Other specified anxiety disorders: Secondary | ICD-10-CM | POA: Diagnosis not present

## 2015-06-12 DIAGNOSIS — M810 Age-related osteoporosis without current pathological fracture: Secondary | ICD-10-CM | POA: Diagnosis not present

## 2015-06-12 DIAGNOSIS — Z0000 Encounter for general adult medical examination without abnormal findings: Secondary | ICD-10-CM | POA: Diagnosis not present

## 2015-06-12 DIAGNOSIS — E039 Hypothyroidism, unspecified: Secondary | ICD-10-CM | POA: Diagnosis not present

## 2015-07-10 DIAGNOSIS — H25811 Combined forms of age-related cataract, right eye: Secondary | ICD-10-CM | POA: Diagnosis not present

## 2015-07-10 DIAGNOSIS — H26492 Other secondary cataract, left eye: Secondary | ICD-10-CM | POA: Diagnosis not present

## 2015-09-16 DIAGNOSIS — Z23 Encounter for immunization: Secondary | ICD-10-CM | POA: Diagnosis not present

## 2015-10-31 DIAGNOSIS — K52831 Collagenous colitis: Secondary | ICD-10-CM | POA: Diagnosis not present

## 2015-12-11 DIAGNOSIS — M1612 Unilateral primary osteoarthritis, left hip: Secondary | ICD-10-CM | POA: Diagnosis not present

## 2015-12-12 DIAGNOSIS — M1612 Unilateral primary osteoarthritis, left hip: Secondary | ICD-10-CM | POA: Diagnosis not present

## 2015-12-16 DIAGNOSIS — M1612 Unilateral primary osteoarthritis, left hip: Secondary | ICD-10-CM | POA: Diagnosis not present

## 2015-12-18 DIAGNOSIS — E039 Hypothyroidism, unspecified: Secondary | ICD-10-CM | POA: Diagnosis not present

## 2015-12-18 DIAGNOSIS — F418 Other specified anxiety disorders: Secondary | ICD-10-CM | POA: Diagnosis not present

## 2015-12-18 DIAGNOSIS — M722 Plantar fascial fibromatosis: Secondary | ICD-10-CM | POA: Diagnosis not present

## 2016-01-16 DIAGNOSIS — M1612 Unilateral primary osteoarthritis, left hip: Secondary | ICD-10-CM | POA: Diagnosis not present

## 2016-01-16 DIAGNOSIS — M84352A Stress fracture, left femur, initial encounter for fracture: Secondary | ICD-10-CM | POA: Diagnosis not present

## 2016-01-16 DIAGNOSIS — M84352D Stress fracture, left femur, subsequent encounter for fracture with routine healing: Secondary | ICD-10-CM | POA: Diagnosis not present

## 2016-02-24 DIAGNOSIS — M1612 Unilateral primary osteoarthritis, left hip: Secondary | ICD-10-CM | POA: Diagnosis not present

## 2016-02-24 DIAGNOSIS — M84352D Stress fracture, left femur, subsequent encounter for fracture with routine healing: Secondary | ICD-10-CM | POA: Diagnosis not present

## 2016-05-14 DIAGNOSIS — Z1231 Encounter for screening mammogram for malignant neoplasm of breast: Secondary | ICD-10-CM | POA: Diagnosis not present

## 2016-05-17 DIAGNOSIS — K52831 Collagenous colitis: Secondary | ICD-10-CM | POA: Diagnosis not present

## 2016-05-17 DIAGNOSIS — M810 Age-related osteoporosis without current pathological fracture: Secondary | ICD-10-CM | POA: Diagnosis not present

## 2016-05-21 DIAGNOSIS — K52831 Collagenous colitis: Secondary | ICD-10-CM | POA: Diagnosis not present

## 2016-06-15 DIAGNOSIS — I10 Essential (primary) hypertension: Secondary | ICD-10-CM | POA: Diagnosis not present

## 2016-06-15 DIAGNOSIS — Z79899 Other long term (current) drug therapy: Secondary | ICD-10-CM | POA: Diagnosis not present

## 2016-06-15 DIAGNOSIS — F418 Other specified anxiety disorders: Secondary | ICD-10-CM | POA: Diagnosis not present

## 2016-06-15 DIAGNOSIS — Z0000 Encounter for general adult medical examination without abnormal findings: Secondary | ICD-10-CM | POA: Diagnosis not present

## 2016-06-15 DIAGNOSIS — E039 Hypothyroidism, unspecified: Secondary | ICD-10-CM | POA: Diagnosis not present

## 2016-06-15 DIAGNOSIS — D510 Vitamin B12 deficiency anemia due to intrinsic factor deficiency: Secondary | ICD-10-CM | POA: Diagnosis not present

## 2016-06-18 DIAGNOSIS — E538 Deficiency of other specified B group vitamins: Secondary | ICD-10-CM | POA: Diagnosis not present

## 2016-07-08 DIAGNOSIS — M84352D Stress fracture, left femur, subsequent encounter for fracture with routine healing: Secondary | ICD-10-CM | POA: Diagnosis not present

## 2016-07-08 DIAGNOSIS — M1612 Unilateral primary osteoarthritis, left hip: Secondary | ICD-10-CM | POA: Diagnosis not present

## 2016-07-12 DIAGNOSIS — F418 Other specified anxiety disorders: Secondary | ICD-10-CM | POA: Diagnosis not present

## 2016-07-12 DIAGNOSIS — L304 Erythema intertrigo: Secondary | ICD-10-CM | POA: Diagnosis not present

## 2016-07-13 DIAGNOSIS — H26492 Other secondary cataract, left eye: Secondary | ICD-10-CM | POA: Diagnosis not present

## 2016-07-13 DIAGNOSIS — H25811 Combined forms of age-related cataract, right eye: Secondary | ICD-10-CM | POA: Diagnosis not present

## 2016-07-16 ENCOUNTER — Other Ambulatory Visit: Payer: Self-pay

## 2016-07-22 DIAGNOSIS — M1612 Unilateral primary osteoarthritis, left hip: Secondary | ICD-10-CM | POA: Diagnosis not present

## 2016-07-22 DIAGNOSIS — E538 Deficiency of other specified B group vitamins: Secondary | ICD-10-CM | POA: Diagnosis not present

## 2016-08-19 DIAGNOSIS — M84352D Stress fracture, left femur, subsequent encounter for fracture with routine healing: Secondary | ICD-10-CM | POA: Diagnosis not present

## 2016-08-19 DIAGNOSIS — M1612 Unilateral primary osteoarthritis, left hip: Secondary | ICD-10-CM | POA: Diagnosis not present

## 2016-08-23 DIAGNOSIS — Z23 Encounter for immunization: Secondary | ICD-10-CM | POA: Diagnosis not present

## 2016-08-25 DIAGNOSIS — D510 Vitamin B12 deficiency anemia due to intrinsic factor deficiency: Secondary | ICD-10-CM | POA: Diagnosis not present

## 2016-09-01 DIAGNOSIS — L719 Rosacea, unspecified: Secondary | ICD-10-CM | POA: Diagnosis not present

## 2016-09-01 DIAGNOSIS — R233 Spontaneous ecchymoses: Secondary | ICD-10-CM | POA: Diagnosis not present

## 2016-09-01 DIAGNOSIS — L820 Inflamed seborrheic keratosis: Secondary | ICD-10-CM | POA: Diagnosis not present

## 2016-09-01 DIAGNOSIS — D225 Melanocytic nevi of trunk: Secondary | ICD-10-CM | POA: Diagnosis not present

## 2016-09-01 DIAGNOSIS — L219 Seborrheic dermatitis, unspecified: Secondary | ICD-10-CM | POA: Diagnosis not present

## 2016-09-03 ENCOUNTER — Ambulatory Visit (INDEPENDENT_AMBULATORY_CARE_PROVIDER_SITE_OTHER): Payer: Medicare Other | Admitting: Sports Medicine

## 2016-09-03 ENCOUNTER — Ambulatory Visit (INDEPENDENT_AMBULATORY_CARE_PROVIDER_SITE_OTHER): Payer: Medicare Other

## 2016-09-03 ENCOUNTER — Encounter: Payer: Self-pay | Admitting: Sports Medicine

## 2016-09-03 DIAGNOSIS — M79673 Pain in unspecified foot: Secondary | ICD-10-CM

## 2016-09-03 DIAGNOSIS — I739 Peripheral vascular disease, unspecified: Secondary | ICD-10-CM

## 2016-09-03 DIAGNOSIS — M792 Neuralgia and neuritis, unspecified: Secondary | ICD-10-CM

## 2016-09-03 DIAGNOSIS — M722 Plantar fascial fibromatosis: Secondary | ICD-10-CM | POA: Diagnosis not present

## 2016-09-03 MED ORDER — GABAPENTIN 100 MG PO CAPS: 100.0000 mg | ORAL_CAPSULE | Freq: Every day | ORAL | 3 refills | Status: DC

## 2016-09-03 NOTE — Progress Notes (Signed)
Subjective: Diana Ramsey is a 81 y.o. female patient presents to office with complaint of foot pain all over. States that she has numbness and was diagnosed in the past with neuropathy and also has a history of plantar fasciitis. States that this has been going on for about 10-11 years. Feels burning and numbness sensations constantly to feet is currently on tramadol with no relief. Patient denies a known history of diabetes or any known etiology for neuropathy. Denies a history of trauma. Denies any other pedal complaints.   Patient Active Problem List   Diagnosis Date Noted  . OA (osteoarthritis) of hip 02/19/2015    Current Outpatient Prescriptions on File Prior to Visit  Medication Sig Dispense Refill  . acetaminophen (TYLENOL) 500 MG tablet Take 500-1,000 mg by mouth 2 (two) times daily as needed for moderate pain.    Marland Kitchen ALPRAZolam (XANAX) 0.5 MG tablet Take 0.5 mg by mouth 2 (two) times daily.    Marland Kitchen apixaban (ELIQUIS) 2.5 MG TABS tablet Take 1 tablet (2.5 mg total) by mouth 2 (two) times daily. Take Eliquis for two and a half more weeks, then discontinue Eliquis. Once the patient has completed the blood thinner regimen, then take a Baby 81 mg Aspirin daily for three more weeks. 38 tablet 0  . budesonide (ENTOCORT EC) 3 MG 24 hr capsule Take 3 mg by mouth every morning.    Marland Kitchen HYDROmorphone (DILAUDID) 2 MG tablet Take 1 tablet (2 mg total) by mouth every 4 (four) hours as needed for moderate pain or severe pain. 80 tablet 0  . iron polysaccharides (NIFEREX) 150 MG capsule Take 1 capsule (150 mg total) by mouth daily. 21 capsule 0  . levothyroxine (SYNTHROID, LEVOTHROID) 50 MCG tablet Take 50 mcg by mouth daily before breakfast.    . loperamide (IMODIUM A-D) 2 MG tablet Take 2 mg by mouth 4 (four) times daily as needed for diarrhea or loose stools.    . methocarbamol (ROBAXIN) 500 MG tablet Take 1 tablet (500 mg total) by mouth every 6 (six) hours as needed for muscle spasms. 80 tablet 0  .  metoprolol (LOPRESSOR) 50 MG tablet Take 50 mg by mouth 2 (two) times daily.    . pravastatin (PRAVACHOL) 80 MG tablet Take 80 mg by mouth every morning.    . sertraline (ZOLOFT) 100 MG tablet Take 100 mg by mouth every morning.    . temazepam (RESTORIL) 30 MG capsule Take 30 mg by mouth at bedtime.    . traMADol (ULTRAM) 50 MG tablet Take 1-2 tablets (50-100 mg total) by mouth every 12 (twelve) hours as needed (mild to moderate pain). 60 tablet 1   No current facility-administered medications on file prior to visit.     Allergies  Allergen Reactions  . Morphine And Related Nausea Only  . Penicillins Rash    Objective: Physical Exam General: The patient is alert and oriented x3 in no acute distress.  Dermatology: Skin is warm, dry and supple bilateral lower extremities. Nails 1-10 are normal. There is no erythema, Trace edema, no eccymosis, no open lesions present. Integument is otherwise unremarkable.  Vascular: Dorsalis Pedis pulse and Posterior Tibial pulse are 1 out of 4 bilateral. Capillary fill time less than 5 seconds to all digits. There is significant hyperpigmentation secondary to venous skin changes and varicosities bilateral.  Neurological: Grossly intact to light touch with an achilles reflex of +2/5 and a  negative Tinel's sign bilateral. Subjective numbness to both feet.  Musculoskeletal: There  is no tenderness to palpation at the medial calcaneal tubercale and through the insertion of the plantar fascia on the left or right foot. No pain with compression of calcaneus bilateral. No pain with tuning fork to calcaneus bilateral. No pain with calf compression bilateral. There is decreased Ankle joint range of motion bilateral. All other joints range of motion within normal limits bilateral. Bunion and hammertoes bilateral. Strength 5/5 in all groups bilateral.   Xray, Right and Left foot:  Normal osseous mineralization. Joint spaces preserved except at midfoot, where there is  mild change suggestive of osteoarthritis. There is significant bunion and hammertoe deformity. No fracture/dislocation/boney destruction. Calcaneal spur present with mild thickening of plantar fascia. No other soft tissue abnormalities or radiopaque foreign bodies.   Assessment and Plan: Problem List Items Addressed This Visit    None    Visit Diagnoses    Pain of foot, unspecified laterality    -  Primary   Relevant Orders   DG Foot 2 Views Left   DG Foot 2 Views Right   Neuritis       Relevant Medications   gabapentin (NEURONTIN) 100 MG capsule   PVD (peripheral vascular disease) (HCC)       Plantar fasciitis       history      -Complete examination performed.  -Xrays reviewed -Discussed with patient in detail the condition ofLikely neuritis secondary to vascular insufficiency with non acute plantar fasciitis -Recommend topical creams and daily soaking for foot pain -Prescribed gabapentin 100 mg at bedtime. May slowly increase up to 300 mg as tolerated -Recommend patient to wear compression socks and good supportive shoes -Will order vascular and nerve conduction tests. If no improvement in symptoms. -Patient to return to office in 6 weeks for follow up/med check or sooner if problems or questions arise.  Landis Martins, DPM

## 2016-09-27 DIAGNOSIS — E538 Deficiency of other specified B group vitamins: Secondary | ICD-10-CM | POA: Diagnosis not present

## 2016-10-15 ENCOUNTER — Ambulatory Visit (INDEPENDENT_AMBULATORY_CARE_PROVIDER_SITE_OTHER): Payer: Medicare Other | Admitting: Sports Medicine

## 2016-10-15 ENCOUNTER — Encounter: Payer: Self-pay | Admitting: Sports Medicine

## 2016-10-15 DIAGNOSIS — M779 Enthesopathy, unspecified: Secondary | ICD-10-CM

## 2016-10-15 DIAGNOSIS — M722 Plantar fascial fibromatosis: Secondary | ICD-10-CM

## 2016-10-15 DIAGNOSIS — M792 Neuralgia and neuritis, unspecified: Secondary | ICD-10-CM | POA: Diagnosis not present

## 2016-10-15 DIAGNOSIS — I739 Peripheral vascular disease, unspecified: Secondary | ICD-10-CM

## 2016-10-15 MED ORDER — METHYLPREDNISOLONE 4 MG PO TBPK: ORAL_TABLET | ORAL | 0 refills | Status: DC

## 2016-10-15 MED ORDER — GABAPENTIN 300 MG PO CAPS: 300.0000 mg | ORAL_CAPSULE | Freq: Every day | ORAL | 0 refills | Status: DC

## 2016-10-15 NOTE — Progress Notes (Signed)
Subjective: Diana Ramsey is a 81 y.o. female patient returns to office for follow-up of right ablation of pins and needles sensation to both feet. States that she tolerated the gabapentin well and felt the most relief at 300 mg at bedtime. States that over the last few weeks she has noticed more pain along the bottoms of her feet and along the back of her heels. Admits to a history of plantar fasciitis. States that sometimes for a few weeks. Her feet. Inflamed and all of a sudden it goes away. She is not sure what is the cause of this and states that she gets the most relief with rest and elevation. Denies any other pedal complaints.   Patient Active Problem List   Diagnosis Date Noted  . OA (osteoarthritis) of hip 02/19/2015    Current Outpatient Prescriptions on File Prior to Visit  Medication Sig Dispense Refill  . acetaminophen (TYLENOL) 500 MG tablet Take 500-1,000 mg by mouth 2 (two) times daily as needed for moderate pain.    Marland Kitchen ALPRAZolam (XANAX) 0.5 MG tablet Take 0.5 mg by mouth 2 (two) times daily.    . budesonide (ENTOCORT EC) 3 MG 24 hr capsule     . citalopram (CELEXA) 10 MG tablet     . FLUZONE HIGH-DOSE 0.5 ML SUSY GIVE 0.5ML BY INTRAMUSCULAR INJECTION.  0  . ibandronate (BONIVA) 150 MG tablet     . levothyroxine (SYNTHROID, LEVOTHROID) 50 MCG tablet Take 50 mcg by mouth daily before breakfast.    . loperamide (IMODIUM A-D) 2 MG tablet Take 2 mg by mouth 4 (four) times daily as needed for diarrhea or loose stools.    . metoprolol (LOPRESSOR) 50 MG tablet Take 50 mg by mouth 2 (two) times daily.    . mirtazapine (REMERON) 30 MG tablet     . NYSTATIN powder     . pravastatin (PRAVACHOL) 80 MG tablet Take 80 mg by mouth every morning.    . sertraline (ZOLOFT) 100 MG tablet Take 100 mg by mouth every morning.    . temazepam (RESTORIL) 30 MG capsule Take 30 mg by mouth at bedtime.    . traMADol (ULTRAM) 50 MG tablet Take 1-2 tablets (50-100 mg total) by mouth every 12 (twelve)  hours as needed (mild to moderate pain). 60 tablet 1   No current facility-administered medications on file prior to visit.     Allergies  Allergen Reactions  . Morphine And Related Nausea Only  . Penicillins Rash    Objective: Physical Exam General: The patient is alert and oriented x3 in no acute distress.  Dermatology: Skin is cool, dry and supple bilateral lower extremities. Nails 1-10 are normal. There is no erythema, Trace edema, no eccymosis, no open lesions present. Integument is otherwise unremarkable.  Vascular: Dorsalis Pedis pulse and Posterior Tibial pulse are 1 out of 4 bilateral. Capillary fill time less than 5 seconds to all digits. There is significant hyperpigmentation secondary to venous skin changes and varicosities bilateral with purple discoloration and hue to plantar surfaces of both feet.  Neurological: Grossly intact to light touch with an achilles reflex of +2/5 and a negative Tinel's sign bilateral. Subjective numbness and needle to both feet.  Musculoskeletal: There is mild tenderness to palpation at the medial calcaneal tubercale and through the insertion of the plantar fascia on the right greater than left foot with also pain with palpation at the posterior heel ascending along the Achilles tendon through the watershed area right greater than  left. No pain with compression of calcaneus bilateral. No pain with tuning fork to calcaneus bilateral. No pain with calf compression bilateral. There is decreased Ankle joint range of motion bilateral. All other joints range of motion within normal limits bilateral. Bunion and hammertoes bilateral. Strength 5/5 in all groups bilateral.   Assessment and Plan: Problem List Items Addressed This Visit    None    Visit Diagnoses    Neuritis    -  Primary   Relevant Medications   gabapentin (NEURONTIN) 300 MG capsule   methylPREDNISolone (MEDROL DOSEPAK) 4 MG TBPK tablet   Tendonitis       Relevant Medications    methylPREDNISolone (MEDROL DOSEPAK) 4 MG TBPK tablet   Plantar fasciitis       Relevant Medications   methylPREDNISolone (MEDROL DOSEPAK) 4 MG TBPK tablet   PVD (peripheral vascular disease) (HCC)          -Complete examination performed.  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 If no improvement in symptoms. -Will also order physical therapy for inflammatory foot pain at next visit if patient still has symptoms -Patient to return to office in 5-6 weeks for follow up/med check or sooner if problems or questions arise.  Landis Martins, DPM

## 2016-11-01 DIAGNOSIS — E538 Deficiency of other specified B group vitamins: Secondary | ICD-10-CM | POA: Diagnosis not present

## 2016-11-19 ENCOUNTER — Encounter: Payer: Self-pay | Admitting: Sports Medicine

## 2016-11-19 ENCOUNTER — Ambulatory Visit (INDEPENDENT_AMBULATORY_CARE_PROVIDER_SITE_OTHER): Payer: Medicare Other | Admitting: Sports Medicine

## 2016-11-19 DIAGNOSIS — M79673 Pain in unspecified foot: Secondary | ICD-10-CM

## 2016-11-19 DIAGNOSIS — M792 Neuralgia and neuritis, unspecified: Secondary | ICD-10-CM

## 2016-11-19 DIAGNOSIS — M722 Plantar fascial fibromatosis: Secondary | ICD-10-CM

## 2016-11-19 DIAGNOSIS — M779 Enthesopathy, unspecified: Secondary | ICD-10-CM

## 2016-11-19 DIAGNOSIS — I739 Peripheral vascular disease, unspecified: Secondary | ICD-10-CM

## 2016-11-19 MED ORDER — DICLOFENAC SODIUM 1 % TD GEL: 2.0000 g | Freq: Four times a day (QID) | TRANSDERMAL | 5 refills | Status: DC

## 2016-11-19 MED ORDER — GABAPENTIN 300 MG PO CAPS: 300.0000 mg | ORAL_CAPSULE | Freq: Every day | ORAL | 4 refills | Status: DC

## 2016-11-19 MED ORDER — TRIAMCINOLONE ACETONIDE 10 MG/ML IJ SUSP: 10.0000 mg | Freq: Once | INTRAMUSCULAR | Status: AC

## 2016-11-19 NOTE — Progress Notes (Signed)
Subjective: Diana Ramsey is a 82 y.o. female patient returns to office for follow-up of pins and needle pain to both feet; Reports its better with Gabapentin and that her heels are very bothersome and desires injection/treatment; tried topical voltaren which helps however nothing else has helps feels like its getting worse. Denies any other pedal complaints.   Patient Active Problem List   Diagnosis Date Noted  . OA (osteoarthritis) of hip 02/19/2015    Current Outpatient Prescriptions on File Prior to Visit  Medication Sig Dispense Refill  . acetaminophen (TYLENOL) 500 MG tablet Take 500-1,000 mg by mouth 2 (two) times daily as needed for moderate pain.    Marland Kitchen ALPRAZolam (XANAX) 0.5 MG tablet Take 0.5 mg by mouth 2 (two) times daily.    . budesonide (ENTOCORT EC) 3 MG 24 hr capsule     . celecoxib (CELEBREX) 200 MG capsule     . Ciclopirox 1 % shampoo     . citalopram (CELEXA) 10 MG tablet     . FLUZONE HIGH-DOSE 0.5 ML SUSY GIVE 0.5ML BY INTRAMUSCULAR INJECTION.  0  . ibandronate (BONIVA) 150 MG tablet     . levothyroxine (SYNTHROID, LEVOTHROID) 50 MCG tablet Take 50 mcg by mouth daily before breakfast.    . loperamide (IMODIUM A-D) 2 MG tablet Take 2 mg by mouth 4 (four) times daily as needed for diarrhea or loose stools.    . methylPREDNISolone (MEDROL DOSEPAK) 4 MG TBPK tablet Take as instructed 21 tablet 0  . metoprolol (LOPRESSOR) 50 MG tablet Take 50 mg by mouth 2 (two) times daily.    . metroNIDAZOLE (METROCREAM) 0.75 % cream     . mirtazapine (REMERON) 30 MG tablet     . NYSTATIN powder     . pravastatin (PRAVACHOL) 80 MG tablet Take 80 mg by mouth every morning.    . sertraline (ZOLOFT) 100 MG tablet Take 100 mg by mouth every morning.    . temazepam (RESTORIL) 30 MG capsule Take 30 mg by mouth at bedtime.    . traMADol (ULTRAM) 50 MG tablet Take 1-2 tablets (50-100 mg total) by mouth every 12 (twelve) hours as needed (mild to moderate pain). 60 tablet 1   No current  facility-administered medications on file prior to visit.     Allergies  Allergen Reactions  . Morphine And Related Nausea Only  . Penicillins Rash    Objective: Physical Exam General: The patient is alert and oriented x3 in no acute distress.  Dermatology: Skin is cool, dry and supple bilateral lower extremities. Nails 1-10 are normal. There is no erythema, Trace edema, no eccymosis, no open lesions present. Integument is otherwise unremarkable.  Vascular: Dorsalis Pedis pulse and Posterior Tibial pulse are 1 out of 4 bilateral. Capillary fill time less than 5 seconds to all digits. There is significant hyperpigmentation secondary to venous skin changes and varicosities bilateral with purple discoloration and hue to plantar surfaces of both feet.  Neurological: Grossly intact to light touch with an achilles reflex of +2/5 and a negative Tinel's sign bilateral. Subjective numbness and needle to both feet that is improved with Gabapentin.  Musculoskeletal: There is mild tenderness to palpation at the medial calcaneal tubercale and through the insertion of the plantar fascia on the right greater than left foot with also pain with palpation at the posterior heel ascending along the Achilles tendon through the watershed area right greater than left and at lateral insertions. No pain with compression of calcaneus bilateral. No  pain with tuning fork to calcaneus bilateral. No pain with calf compression bilateral. There is decreased Ankle joint range of motion bilateral. All other joints range of motion within normal limits bilateral. Bunion and hammertoes bilateral. Strength 5/5 in all groups bilateral.   Assessment and Plan: Problem List Items Addressed This Visit    None    Visit Diagnoses    Plantar fasciitis    -  Primary   Relevant Medications   diclofenac sodium (VOLTAREN) 1 % GEL   triamcinolone acetonide (KENALOG) 10 MG/ML injection 10 mg   Tendonitis       Relevant Medications    diclofenac sodium (VOLTAREN) 1 % GEL   Neuritis       Relevant Medications   diclofenac sodium (VOLTAREN) 1 % GEL   gabapentin (NEURONTIN) 300 MG capsule   PVD (peripheral vascular disease) (HCC)       Relevant Medications   diclofenac sodium (VOLTAREN) 1 % GEL   Pain of foot, unspecified laterality       Relevant Medications   diclofenac sodium (VOLTAREN) 1 % GEL     -Complete examination performed.  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 Post-injection care discussed with patient.  -Recommend topical creams and daily soaking for foot pain; Rx voltaren topical for foot pain  -Advised patient continue with 300 mg of gabapentin at bedtime; refill provided -Recommend patient to wear compression socks and good supportive shoes. -Will also order physical therapy for inflammatory foot pain when patient is ready; Patient will call for order, would like to talk with ortho doctor about her left hip first -Patient to return to office PRN for follow up/med check or sooner if problems or questions arise.  Landis Martins, DPM

## 2016-12-08 DIAGNOSIS — E538 Deficiency of other specified B group vitamins: Secondary | ICD-10-CM | POA: Diagnosis not present

## 2016-12-14 ENCOUNTER — Telehealth: Payer: Self-pay | Admitting: *Deleted

## 2016-12-17 NOTE — Telephone Encounter (Signed)
Received refill request for Methylprednisolone 4mg . Return fax denied.

## 2016-12-20 DIAGNOSIS — E039 Hypothyroidism, unspecified: Secondary | ICD-10-CM | POA: Diagnosis not present

## 2016-12-20 DIAGNOSIS — Z79899 Other long term (current) drug therapy: Secondary | ICD-10-CM | POA: Diagnosis not present

## 2016-12-20 DIAGNOSIS — F418 Other specified anxiety disorders: Secondary | ICD-10-CM | POA: Diagnosis not present

## 2016-12-20 DIAGNOSIS — E785 Hyperlipidemia, unspecified: Secondary | ICD-10-CM | POA: Diagnosis not present

## 2016-12-20 DIAGNOSIS — I10 Essential (primary) hypertension: Secondary | ICD-10-CM | POA: Diagnosis not present

## 2017-01-06 DIAGNOSIS — M1612 Unilateral primary osteoarthritis, left hip: Secondary | ICD-10-CM | POA: Diagnosis not present

## 2017-01-06 DIAGNOSIS — M84352D Stress fracture, left femur, subsequent encounter for fracture with routine healing: Secondary | ICD-10-CM | POA: Diagnosis not present

## 2017-01-07 DIAGNOSIS — E538 Deficiency of other specified B group vitamins: Secondary | ICD-10-CM | POA: Diagnosis not present

## 2017-01-20 DIAGNOSIS — Z01810 Encounter for preprocedural cardiovascular examination: Secondary | ICD-10-CM | POA: Diagnosis not present

## 2017-01-28 ENCOUNTER — Telehealth: Payer: Self-pay | Admitting: Sports Medicine

## 2017-01-28 DIAGNOSIS — M779 Enthesopathy, unspecified: Secondary | ICD-10-CM

## 2017-01-28 DIAGNOSIS — M722 Plantar fascial fibromatosis: Secondary | ICD-10-CM

## 2017-01-28 NOTE — Telephone Encounter (Signed)
Pt needs a Rx to go to Physical therapy. Her hip surgery is in June.

## 2017-01-31 NOTE — Addendum Note (Signed)
Addended by: Harriett Sine D on: 01/31/2017 12:55 PM   Modules accepted: Orders

## 2017-01-31 NOTE — Telephone Encounter (Signed)
Dr. Cannon Kettle in 11/19/2016 states will send pt to PT for inflammation of her feet when pt was ready. Orders faxed to [facility].

## 2017-02-04 DIAGNOSIS — D510 Vitamin B12 deficiency anemia due to intrinsic factor deficiency: Secondary | ICD-10-CM | POA: Diagnosis not present

## 2017-02-08 DIAGNOSIS — M722 Plantar fascial fibromatosis: Secondary | ICD-10-CM | POA: Diagnosis not present

## 2017-02-08 DIAGNOSIS — M779 Enthesopathy, unspecified: Secondary | ICD-10-CM | POA: Diagnosis not present

## 2017-02-08 DIAGNOSIS — R262 Difficulty in walking, not elsewhere classified: Secondary | ICD-10-CM | POA: Diagnosis not present

## 2017-02-11 DIAGNOSIS — R262 Difficulty in walking, not elsewhere classified: Secondary | ICD-10-CM | POA: Diagnosis not present

## 2017-02-11 DIAGNOSIS — M779 Enthesopathy, unspecified: Secondary | ICD-10-CM | POA: Diagnosis not present

## 2017-02-11 DIAGNOSIS — M722 Plantar fascial fibromatosis: Secondary | ICD-10-CM | POA: Diagnosis not present

## 2017-02-14 DIAGNOSIS — M779 Enthesopathy, unspecified: Secondary | ICD-10-CM | POA: Diagnosis not present

## 2017-02-14 DIAGNOSIS — M722 Plantar fascial fibromatosis: Secondary | ICD-10-CM | POA: Diagnosis not present

## 2017-02-14 DIAGNOSIS — R262 Difficulty in walking, not elsewhere classified: Secondary | ICD-10-CM | POA: Diagnosis not present

## 2017-02-21 DIAGNOSIS — M779 Enthesopathy, unspecified: Secondary | ICD-10-CM | POA: Diagnosis not present

## 2017-02-21 DIAGNOSIS — M722 Plantar fascial fibromatosis: Secondary | ICD-10-CM | POA: Diagnosis not present

## 2017-02-21 DIAGNOSIS — R262 Difficulty in walking, not elsewhere classified: Secondary | ICD-10-CM | POA: Diagnosis not present

## 2017-02-23 DIAGNOSIS — M779 Enthesopathy, unspecified: Secondary | ICD-10-CM | POA: Diagnosis not present

## 2017-02-23 DIAGNOSIS — M25571 Pain in right ankle and joints of right foot: Secondary | ICD-10-CM | POA: Diagnosis not present

## 2017-02-23 DIAGNOSIS — R262 Difficulty in walking, not elsewhere classified: Secondary | ICD-10-CM | POA: Diagnosis not present

## 2017-02-23 DIAGNOSIS — M722 Plantar fascial fibromatosis: Secondary | ICD-10-CM | POA: Diagnosis not present

## 2017-03-14 DIAGNOSIS — D510 Vitamin B12 deficiency anemia due to intrinsic factor deficiency: Secondary | ICD-10-CM | POA: Diagnosis not present

## 2017-03-29 DIAGNOSIS — Z6828 Body mass index (BMI) 28.0-28.9, adult: Secondary | ICD-10-CM | POA: Diagnosis not present

## 2017-03-29 DIAGNOSIS — Z01818 Encounter for other preprocedural examination: Secondary | ICD-10-CM | POA: Diagnosis not present

## 2017-03-31 NOTE — Progress Notes (Signed)
Please place orders in EPIC as patient is being scheduled for a pre-op appointment! Thank you! 

## 2017-04-01 ENCOUNTER — Ambulatory Visit: Payer: Self-pay | Admitting: Orthopedic Surgery

## 2017-04-18 DIAGNOSIS — D510 Vitamin B12 deficiency anemia due to intrinsic factor deficiency: Secondary | ICD-10-CM | POA: Diagnosis not present

## 2017-04-20 ENCOUNTER — Other Ambulatory Visit (HOSPITAL_COMMUNITY): Payer: Self-pay | Admitting: Emergency Medicine

## 2017-04-20 NOTE — Patient Instructions (Addendum)
Diana Ramsey  04/20/2017   Your procedure is scheduled on: 04-27-17  Report to [facility] [address] Main  Entrance    Report to admitting at 730AM   Call this number if you have problems the morning of surgery [phone]   Remember: ONLY 1 PERSON MAY GO WITH YOU TO SHORT STAY TO GET  READY MORNING OF YOUR SURGERY.  Do not eat food or drink liquids :After Midnight.     Take these medicines the morning of surgery with A SIP OF WATER: xanax, citalopram(celexa), levothyroxine(synthroid), metoprolol, tramadol as needed                                You may not have any metal on your body including hair pins and              piercings  Do not wear jewelry, make-up, lotions, powders or perfumes, deodorant             Do not wear nail polish.  Do not shave  48 hours prior to surgery.     Do not bring valuables to the hospital. [place].  Contacts, dentures or bridgework may not be worn into surgery.  Leave suitcase in the car. After surgery it may be brought to your room.                Please read over the following fact sheets you were given: _____________________________________________________________________   [facility] - Preparing for Surgery Before surgery, you can play an important role.  Because skin is not sterile, your skin needs to be as free of germs as possible.  You can reduce the number of germs on your skin by washing with CHG (chlorahexidine gluconate) soap before surgery.  CHG is an antiseptic cleaner which kills germs and bonds with the skin to continue killing germs even after washing. Please DO NOT use if you have an allergy to CHG or antibacterial soaps.  If your skin becomes reddened/irritated stop using the CHG and inform your nurse when you arrive at Short Stay. Do not shave (including legs and underarms) for at least 48 hours prior to the first CHG shower.  You may shave your  face/neck. Please follow these instructions carefully:  1.  Shower with CHG Soap the night before surgery and the  morning of Surgery.  2.  If you choose to wash your hair, wash your hair first as usual with your  normal  shampoo.  3.  After you shampoo, rinse your hair and body thoroughly to remove the  shampoo.                           4.  Use CHG as you would any other liquid soap.  You can apply chg directly  to the skin and wash                       Gently with a scrungie or clean washcloth.  5.  Apply the CHG Soap to your body ONLY FROM THE NECK DOWN.   Do not use on face/ open  Wound or open sores. Avoid contact with eyes, ears mouth and genitals (private parts).                       Wash face,  Genitals (private parts) with your normal soap.             6.  Wash thoroughly, paying special attention to the area where your surgery  will be performed.  7.  Thoroughly rinse your body with warm water from the neck down.  8.  DO NOT shower/wash with your normal soap after using and rinsing off  the CHG Soap.                9.  Pat yourself dry with a clean towel.            10.  Wear clean pajamas.            11.  Place clean sheets on your bed the night of your first shower and do not  sleep with pets. Day of Surgery : Do not apply any lotions/deodorants the morning of surgery.  Please wear clean clothes to the hospital/surgery center.  FAILURE TO FOLLOW THESE INSTRUCTIONS MAY RESULT IN THE CANCELLATION OF YOUR SURGERY PATIENT SIGNATURE_________________________________  NURSE SIGNATURE__________________________________  ________________________________________________________________________   Adam Phenix  An incentive spirometer is a tool that can help keep your lungs clear and active. This tool measures how well you are filling your lungs with each breath. Taking long deep breaths may help reverse or decrease the chance of developing breathing  (pulmonary) problems (especially infection) following:  A long period of time when you are unable to move or be active. BEFORE THE PROCEDURE   If the spirometer includes an indicator to show your best effort, your nurse or respiratory therapist will set it to a desired goal.  If possible, sit up straight or lean slightly forward. Try not to slouch.  Hold the incentive spirometer in an upright position. INSTRUCTIONS FOR USE  1. Sit on the edge of your bed if possible, or sit up as far as you can in bed or on a chair. 2. Hold the incentive spirometer in an upright position. 3. Breathe out normally. 4. Place the mouthpiece in your mouth and seal your lips tightly around it. 5. Breathe in slowly and as deeply as possible, raising the piston or the ball toward the top of the column. 6. Hold your breath for 3-5 seconds or for as long as possible. Allow the piston or ball to fall to the bottom of the column. 7. Remove the mouthpiece from your mouth and breathe out normally. 8. Rest for a few seconds and repeat Steps 1 through 7 at least 10 times every 1-2 hours when you are awake. Take your time and take a few normal breaths between deep breaths. 9. The spirometer may include an indicator to show your best effort. Use the indicator as a goal to work toward during each repetition. 10. After each set of 10 deep breaths, practice coughing to be sure your lungs are clear. If you have an incision (the cut made at the time of surgery), support your incision when coughing by placing a pillow or rolled up towels firmly against it. Once you are able to get out of bed, walk around indoors and cough well. You may stop using the incentive spirometer when instructed by your caregiver.  RISKS AND COMPLICATIONS  Take your time so you do not get  dizzy or light-headed.  If you are in pain, you may need to take or ask for pain medication before doing incentive spirometry. It is harder to take a deep breath if you  are having pain. AFTER USE  Rest and breathe slowly and easily.  It can be helpful to keep track of a log of your progress. Your caregiver can provide you with a simple table to help with this. If you are using the spirometer at home, follow these instructions: [place] IF:   You are having difficultly using the spirometer.  You have trouble using the spirometer as often as instructed.  Your pain medication is not giving enough relief while using the spirometer.  You develop fever of 100.5 F (38.1 C) or higher. SEEK IMMEDIATE MEDICAL CARE IF:   You cough up bloody sputum that had not been present before.  You develop fever of 102 F (38.9 C) or greater.  You develop worsening pain at or near the incision site. MAKE SURE YOU:   Understand these instructions.  Will watch your condition.  Will get help right away if you are not doing well or get worse. Document Released: 03/14/2007 Document Revised: 01/24/2012 Document Reviewed: 05/15/2007 ExitCare Patient Information 2014 ExitCare, [place].   ________________________________________________________________________  WHAT IS A BLOOD TRANSFUSION? Blood Transfusion Information  A transfusion is the replacement of blood or some of its parts. Blood is made up of multiple cells which provide different functions.  Red blood cells carry oxygen and are used for blood loss replacement.  White blood cells fight against infection.  Platelets control bleeding.  Plasma helps clot blood.  Other blood products are available for specialized needs, such as hemophilia or other clotting disorders. BEFORE THE TRANSFUSION  Who gives blood for transfusions?   Healthy volunteers who are fully evaluated to make sure their blood is safe. This is blood bank blood. Transfusion therapy is the safest it has ever been in the practice of medicine. Before blood is taken from a donor, a complete history is taken to make sure that person has  no history of diseases nor engages in risky social behavior (examples are intravenous drug use or sexual activity with multiple partners). The donor's travel history is screened to minimize risk of transmitting infections, such as malaria. The donated blood is tested for signs of infectious diseases, such as HIV and hepatitis. The blood is then tested to be sure it is compatible with you in order to minimize the chance of a transfusion reaction. If you or a relative donates blood, this is often done in anticipation of surgery and is not appropriate for emergency situations. It takes many days to process the donated blood. RISKS AND COMPLICATIONS Although transfusion therapy is very safe and saves many lives, the main dangers of transfusion include:   Getting an infectious disease.  Developing a transfusion reaction. This is an allergic reaction to something in the blood you were given. Every precaution is taken to prevent this. The decision to have a blood transfusion has been considered carefully by your caregiver before blood is given. Blood is not given unless the benefits outweigh the risks. AFTER THE TRANSFUSION  Right after receiving a blood transfusion, you will usually feel much better and more energetic. This is especially true if your red blood cells have gotten low (anemic). The transfusion raises the level of the red blood cells which carry oxygen, and this usually causes an energy increase.  The nurse administering the transfusion will  monitor you carefully for complications. HOME CARE INSTRUCTIONS  No special instructions are needed after a transfusion. You may find your energy is better. Speak with your caregiver about any limitations on activity for underlying diseases you may have. SEEK MEDICAL CARE IF:   Your condition is not improving after your transfusion.  You develop redness or irritation at the intravenous (IV) site. SEEK IMMEDIATE MEDICAL CARE IF:  Any of the following  symptoms occur over the next 12 hours:  Shaking chills.  You have a temperature by mouth above 102 F (38.9 C), not controlled by medicine.  Chest, back, or muscle pain.  People around you feel you are not acting correctly or are confused.  Shortness of breath or difficulty breathing.  Dizziness and fainting.  You get a rash or develop hives.  You have a decrease in urine output.  Your urine turns a dark color or changes to pink, red, or brown. Any of the following symptoms occur over the next 10 days:  You have a temperature by mouth above 102 F (38.9 C), not controlled by medicine.  Shortness of breath.  Weakness after normal activity.  The white part of the eye turns yellow (jaundice).  You have a decrease in the amount of urine or are urinating less often.  Your urine turns a dark color or changes to pink, red, or brown. Document Released: 10/29/2000 Document Revised: 01/24/2012 Document Reviewed: 06/17/2008 [facility] Patient Information 2014 [place], [place].  _______________________________________________________________________

## 2017-04-20 NOTE — Progress Notes (Signed)
LOV/ clearance Helene Kelp MD [phone] chart EKG 01-20-17 chart

## 2017-04-22 ENCOUNTER — Encounter (HOSPITAL_COMMUNITY)
Admission: RE | Admit: 2017-04-22 | Discharge: 2017-04-22 | Disposition: A | Discharge status: Home / Self Care | Payer: Medicare Other | Source: Ambulatory Visit | Attending: Orthopedic Surgery | Admitting: Orthopedic Surgery

## 2017-04-22 ENCOUNTER — Encounter (HOSPITAL_COMMUNITY): Payer: Self-pay

## 2017-04-22 DIAGNOSIS — Z01812 Encounter for preprocedural laboratory examination: Secondary | ICD-10-CM | POA: Insufficient documentation

## 2017-04-22 DIAGNOSIS — Z0183 Encounter for blood typing: Secondary | ICD-10-CM | POA: Diagnosis not present

## 2017-04-22 DIAGNOSIS — M1612 Unilateral primary osteoarthritis, left hip: Secondary | ICD-10-CM | POA: Insufficient documentation

## 2017-04-22 LAB — COMPREHENSIVE METABOLIC PANEL
ALBUMIN: 4 g/dL (ref 3.5–5.0)
ALT: 18 U/L (ref 14–54)
ANION GAP: 6 (ref 5–15)
AST: 24 U/L (ref 15–41)
Alkaline Phosphatase: 74 U/L (ref 38–126)
BUN: 22 mg/dL — ABNORMAL HIGH (ref 6–20)
CALCIUM: 9.7 mg/dL (ref 8.9–10.3)
CO2: 28 mmol/L (ref 22–32)
Chloride: 106 mmol/L (ref 101–111)
Creatinine, Ser: 1.56 mg/dL — ABNORMAL HIGH (ref 0.44–1.00)
GFR calc non Af Amer: 30 mL/min — ABNORMAL LOW (ref >60)
GFR, EST AFRICAN AMERICAN: 35 mL/min — AB (ref >60)
GLUCOSE: 110 mg/dL — AB (ref 65–99)
POTASSIUM: 4.9 mmol/L (ref 3.5–5.1)
SODIUM: 140 mmol/L (ref 135–145)
Total Bilirubin: 0.5 mg/dL (ref 0.3–1.2)
Total Protein: 6.9 g/dL (ref 6.5–8.1)

## 2017-04-22 LAB — CBC
HCT: 39.4 % (ref 36.0–46.0)
Hemoglobin: 12.8 g/dL (ref 12.0–15.0)
MCH: 34 pg (ref 26.0–34.0)
MCHC: 32.5 g/dL (ref 30.0–36.0)
MCV: 104.8 fL — ABNORMAL HIGH (ref 78.0–100.0)
PLATELETS: 216 10*3/uL (ref 150–400)
RBC: 3.76 MIL/uL — ABNORMAL LOW (ref 3.87–5.11)
RDW: 12.2 % (ref 11.5–15.5)
WBC: 9.6 10*3/uL (ref 4.0–10.5)

## 2017-04-22 LAB — PROTIME-INR
INR: 0.96
Prothrombin Time: 12.8 seconds (ref 11.4–15.2)

## 2017-04-22 LAB — APTT: APTT: 30 s (ref 24–36)

## 2017-04-22 LAB — SURGICAL PCR SCREEN
MRSA, PCR: NEGATIVE
Staphylococcus aureus: NEGATIVE

## 2017-04-22 NOTE — Progress Notes (Signed)
CMP routed via epic to Aluisio

## 2017-04-26 ENCOUNTER — Ambulatory Visit: Payer: Self-pay | Admitting: Orthopedic Surgery

## 2017-04-26 NOTE — H&P (Signed)
Diana Ramsey DOB: 03/30/1935 Single / Language: Cleophus Molt / Race: White Female Date of Admission:  04/27/2017 CC:  Left Hip pain History of Present Illness  The patient is a 82 year old female who comes in  for a preoperative History and Physical. The patient is scheduled for a left total hip arthroplasty (anterior) to be performed by Dr. Dione Plover. Aluisio, MD at [facility] on 04-27-2017. The patient is a 82 year old female who presented for follow up of their hip. The patient is being followed for their left hip pain and osteoarthritis. They are month(s) out from IA injection. Symptoms reported include: pain, pain at night, aching, stiffness, pain with weightbearing, difficulty ambulating and difficulty arising from chair. The patient feels that they are doing poorly and report their pain level to be moderate to severe (pain varies). The following medication has been used for pain control: Tylenol (Arthritis strength) and Ultram (given PCP for feet issues). The patient has not gotten any relief of their symptoms with Cortisone injections (relief for about 1 month). Ms. Vanschaick left hip again is progressively worse. When she had an intraarticular injection five months ago she had a couple of weeks where it helped, but then the pain started to come back. It is getting progressively worse now. It is in the groin anterior thigh. It is very similar to the pain she experienced when she had the total hip on the right. She is having worsening function and worsening pain. It is felt that she would benefit from undergoing a left total hip replacement. They have been treated conservatively in the past for the above stated problem and despite conservative measures, they continue to have progressive pain and severe functional limitations and dysfunction. They have failed non-operative management including home exercise, medications, and injections. It is felt that they would benefit from undergoing total  joint replacement. Risks and benefits of the procedure have been discussed with the patient and they elect to proceed with surgery. There are no active contraindications to surgery such as ongoing infection or rapidly progressive neurological disease.  Problem List/Past Medical Stress fracture of left femur with routine healing (M84.352D)  Primary localized osteoarthritis of left hip (M16.12)  Status post right hip replacement (T73.220)  Hypothyroidism  High blood pressure  Ulcerative Colitis  flareups every few months Hypercholesterolemia  Anxiety Disorder  Depression  Pneumonia  Past History - Last bout October 2015 Varicose veins  Osteoporosis  Measles  Mumps  Peripheral Neuropathy  Feet  Allergies  Penicillins  Rash. Latex  Rash, Itching. Chocolate  GI Upset  Family History  Hypertension  Father, Mother. Cancer  Father. Liver Disease, Chronic  Father.  Social History Tobacco use  Former smoker. 05/02/2014: smoke(d) less than 1/2 pack(s) per day Living situation  live alone Exercise  Exercises weekly; does running / walking Current drinker  05/02/2014: Currently drinks wine only occasionally per week Children  1 Current work status  retired Tobacco / smoke exposure  05/02/2014: no Marital status  widowed Not under pain contract  No history of drug/alcohol rehab  Number of flights of stairs before winded  4-5 [place] with son Advance Directives  Living Will, Healthcare POA  Medication History  Aleve (220MG  Tablet, Oral as needed) Active. Tylenol (prn) Active. Pravastatin Sodium (80MG  Tablet, Oral daily) Active. Metoprolol Tartrate (50MG  Tablet, Oral two times daily) Active. Levothyroxine Sodium (50MCG Tablet, Oral daily) Active. Mirtazapine (30MG  Tablet, Oral daily) Active. TraMADol HCl (50MG  Tablet, Oral) Active.  ALPRAZolam (0.5MG  Tablet, Oral two times daily) Active. Aspirin (81MG  Tablet, 1 (one)  Oral) Active. B12 injection Active.  Past Surgical History Hysterectomy  Date: 61. partial (non-cancerous) Total Hip Replacement - Right  Date: 2016. EGD   Review of Systems General Not Present- Chills, Fatigue, Fever, Memory Loss, Night Sweats, Weight Gain and Weight Loss. Skin Not Present- Eczema, Hives, Itching, Lesions and Rash. HEENT Not Present- Dentures, Double Vision, Headache, Hearing Loss, Tinnitus and Visual Loss. Respiratory Not Present- Allergies, Chronic Cough, Coughing up blood, Shortness of breath at rest and Shortness of breath with exertion. Cardiovascular Not Present- Chest Pain, Difficulty Breathing Lying Down, Murmur, Palpitations, Racing/skipping heartbeats and Swelling. Gastrointestinal Not Present- Abdominal Pain, Bloody Stool, Constipation, Diarrhea, Difficulty Swallowing, Heartburn, Jaundice, Loss of appetitie, Nausea and Vomiting. Female Genitourinary Not Present- Blood in Urine, Discharge, Flank Pain, Incontinence, Painful Urination, Urgency, Urinary frequency, Urinary Retention, Urinating at Night and Weak urinary stream. Musculoskeletal Present- Joint Pain. Not Present- Back Pain, Joint Swelling, Morning Stiffness, Muscle Pain, Muscle Weakness and Spasms. Neurological Not Present- Blackout spells, Difficulty with balance, Dizziness, Paralysis, Tremor and Weakness. Psychiatric Not Present- Insomnia.  Vitals  Weight: 145 lb Height: 61in Weight was reported by patient. Height was reported by patient. Body Surface Area: 1.65 m Body Mass Index: 27.4 kg/m  Pulse: 60 (Regular)  Resp.: 12 (Unlabored)  BP: 162/74 (Sitting, Left Arm, Standard)  Physical Exam General Mental Status -Alert, cooperative and good historian. General Appearance-pleasant, Not in acute distress. Orientation-Oriented X3. Build & Nutrition-Well nourished and Well developed.  Head and Neck Head-normocephalic, atraumatic . Neck Global Assessment - supple, no  bruit auscultated on the right, no bruit auscultated on the left.  Eye Pupil - Bilateral-Regular and Round. Motion - Bilateral-EOMI.  Chest and Lung Exam Auscultation Breath sounds - clear at anterior chest wall and clear at posterior chest wall. Adventitious sounds - No Adventitious sounds.  Cardiovascular Auscultation Rhythm - Regular rate and rhythm. Heart Sounds - S1 WNL and S2 WNL. Murmurs & Other Heart Sounds - Auscultation of the heart reveals - No Murmurs.  Abdomen Palpation/Percussion Tenderness - Abdomen is non-tender to palpation. Rigidity (guarding) - Abdomen is soft. Auscultation Auscultation of the abdomen reveals - Bowel sounds normal.  Female Genitourinary Note: Not done, not pertinent to present illness   Musculoskeletal Note: On exam, she is in no distress. Her left hip can be flexed to 100. Minimal internal rotation, about 10 to 20 of external rotation, 20 of abduction. She does have pain on range of motion of that hip.  RADIOGRAPHS AP pelvis, lateral of left hip show that she is not completely bone-on-bone. She has slight protrusio and has central bone-on-bone, but superiorly the space left. She does have healing of a previous stress fracture in a lateral cortex of the diaphysis to the femur.  Assessment & Plan  Status post right hip replacement (M22.633) Primary localized osteoarthritis of left hip (M16.12)  Note:Surgical Plans: Left Total Hip Replacement - Anterior Approach  Disposition: Home with help from son, No formal therapy, HEP - exercise sheet willbe provided.  PCP: Dr. Kennith Maes - Patient has been seen preoperatively and felt to be stable for surgery. Previous Outpatient Labs (12/20/2016) WBC - 7.7 HGB - 12.6 HCT - 36.7 PLT - 173 Lytes - WNL BUN - 25 (H) Creat - 1.4 (H) Free T-4 - 1.04 TSH - 1.150  IV TXA  Anesthesia Issues: None  Patient was instructed on what medications to stop prior to surgery.  Signed  electronically by Joelene Millin, III PA-C

## 2017-04-27 ENCOUNTER — Inpatient Hospital Stay (HOSPITAL_COMMUNITY)
Admission: RE | Admit: 2017-04-27 | Discharge: 2017-04-29 | DRG: 470 | Disposition: A | Discharge status: Home Health | Payer: Medicare Other | Source: Ambulatory Visit | Attending: Orthopedic Surgery | Admitting: Orthopedic Surgery

## 2017-04-27 ENCOUNTER — Inpatient Hospital Stay (HOSPITAL_COMMUNITY): Payer: Medicare Other | Admitting: Anesthesiology

## 2017-04-27 ENCOUNTER — Inpatient Hospital Stay (HOSPITAL_COMMUNITY): Payer: Medicare Other

## 2017-04-27 ENCOUNTER — Encounter (HOSPITAL_COMMUNITY)
Admission: RE | Disposition: A | Discharge status: Home Health | Payer: Self-pay | Source: Ambulatory Visit | Attending: Orthopedic Surgery

## 2017-04-27 ENCOUNTER — Encounter (HOSPITAL_COMMUNITY): Payer: Self-pay

## 2017-04-27 DIAGNOSIS — E039 Hypothyroidism, unspecified: Secondary | ICD-10-CM | POA: Diagnosis present

## 2017-04-27 DIAGNOSIS — G629 Polyneuropathy, unspecified: Secondary | ICD-10-CM | POA: Diagnosis present

## 2017-04-27 DIAGNOSIS — F329 Major depressive disorder, single episode, unspecified: Secondary | ICD-10-CM | POA: Diagnosis present

## 2017-04-27 DIAGNOSIS — Z91040 Latex allergy status: Secondary | ICD-10-CM

## 2017-04-27 DIAGNOSIS — Z791 Long term (current) use of non-steroidal anti-inflammatories (NSAID): Secondary | ICD-10-CM

## 2017-04-27 DIAGNOSIS — Z90710 Acquired absence of both cervix and uterus: Secondary | ICD-10-CM

## 2017-04-27 DIAGNOSIS — Z96642 Presence of left artificial hip joint: Secondary | ICD-10-CM | POA: Diagnosis not present

## 2017-04-27 DIAGNOSIS — I10 Essential (primary) hypertension: Secondary | ICD-10-CM | POA: Diagnosis present

## 2017-04-27 DIAGNOSIS — M169 Osteoarthritis of hip, unspecified: Secondary | ICD-10-CM | POA: Diagnosis present

## 2017-04-27 DIAGNOSIS — Z880 Allergy status to penicillin: Secondary | ICD-10-CM | POA: Diagnosis not present

## 2017-04-27 DIAGNOSIS — Z471 Aftercare following joint replacement surgery: Secondary | ICD-10-CM | POA: Diagnosis not present

## 2017-04-27 DIAGNOSIS — M1612 Unilateral primary osteoarthritis, left hip: Principal | ICD-10-CM | POA: Diagnosis present

## 2017-04-27 DIAGNOSIS — E7800 Pure hypercholesterolemia, unspecified: Secondary | ICD-10-CM | POA: Diagnosis present

## 2017-04-27 DIAGNOSIS — Z91018 Allergy to other foods: Secondary | ICD-10-CM

## 2017-04-27 DIAGNOSIS — Z87891 Personal history of nicotine dependence: Secondary | ICD-10-CM

## 2017-04-27 DIAGNOSIS — M810 Age-related osteoporosis without current pathological fracture: Secondary | ICD-10-CM | POA: Diagnosis present

## 2017-04-27 DIAGNOSIS — Z96641 Presence of right artificial hip joint: Secondary | ICD-10-CM | POA: Diagnosis present

## 2017-04-27 DIAGNOSIS — F419 Anxiety disorder, unspecified: Secondary | ICD-10-CM | POA: Diagnosis present

## 2017-04-27 DIAGNOSIS — K5190 Ulcerative colitis, unspecified, without complications: Secondary | ICD-10-CM | POA: Diagnosis present

## 2017-04-27 DIAGNOSIS — Z96649 Presence of unspecified artificial hip joint: Secondary | ICD-10-CM

## 2017-04-27 HISTORY — PX: TOTAL HIP ARTHROPLASTY: SHX124

## 2017-04-27 LAB — TYPE AND SCREEN
ABO/RH(D): O POS
ANTIBODY SCREEN: NEGATIVE

## 2017-04-27 SURGERY — ARTHROPLASTY, HIP, TOTAL, ANTERIOR APPROACH
Anesthesia: Spinal | Site: Hip | Laterality: Left

## 2017-04-27 MED ORDER — FENTANYL CITRATE (PF) 100 MCG/2ML IJ SOLN
INTRAMUSCULAR | Status: AC
  Filled 2017-04-27: qty 2

## 2017-04-27 MED ORDER — METOCLOPRAMIDE HCL 5 MG PO TABS: 5.0000 mg | ORAL_TABLET | Freq: Three times a day (TID) | ORAL | Status: DC | PRN

## 2017-04-27 MED ORDER — PROPOFOL 10 MG/ML IV BOLUS
INTRAVENOUS | Status: AC
  Filled 2017-04-27: qty 40

## 2017-04-27 MED ORDER — HYDROMORPHONE HCL 1 MG/ML IJ SOLN
INTRAMUSCULAR | Status: AC
  Filled 2017-04-27: qty 2

## 2017-04-27 MED ORDER — HYDROMORPHONE HCL 1 MG/ML IJ SOLN: 0.5000 mg | INTRAMUSCULAR | Status: DC | PRN

## 2017-04-27 MED ORDER — DIPHENHYDRAMINE HCL 12.5 MG/5ML PO ELIX: 12.5000 mg | ORAL_SOLUTION | ORAL | Status: DC | PRN

## 2017-04-27 MED ORDER — CITALOPRAM HYDROBROMIDE 20 MG PO TABS
10.0000 mg | ORAL_TABLET | Freq: Every day | ORAL | Status: DC
  Administered 2017-04-28 – 2017-04-29 (×2): 10 mg via ORAL
  Filled 2017-04-27 (×2): qty 1

## 2017-04-27 MED ORDER — PHENYLEPHRINE HCL 10 MG/ML IJ SOLN
INTRAMUSCULAR | Status: AC
  Filled 2017-04-27: qty 1

## 2017-04-27 MED ORDER — OXYCODONE HCL 5 MG/5ML PO SOLN
5.0000 mg | Freq: Once | ORAL | Status: DC | PRN
  Filled 2017-04-27: qty 5

## 2017-04-27 MED ORDER — METHOCARBAMOL 500 MG PO TABS
500.0000 mg | ORAL_TABLET | Freq: Four times a day (QID) | ORAL | Status: DC | PRN
  Administered 2017-04-28 (×2): 500 mg via ORAL
  Filled 2017-04-27 (×2): qty 1

## 2017-04-27 MED ORDER — LACTATED RINGERS IV SOLN
INTRAVENOUS | Status: DC
  Administered 2017-04-27 (×2): via INTRAVENOUS
  Administered 2017-04-27: 1000 mL via INTRAVENOUS

## 2017-04-27 MED ORDER — BUPIVACAINE HCL (PF) 0.25 % IJ SOLN
INTRAMUSCULAR | Status: AC
  Filled 2017-04-27: qty 30

## 2017-04-27 MED ORDER — DEXAMETHASONE SODIUM PHOSPHATE 10 MG/ML IJ SOLN
10.0000 mg | Freq: Once | INTRAMUSCULAR | Status: AC
  Administered 2017-04-28: 07:00:00 10 mg via INTRAVENOUS
  Filled 2017-04-27: qty 1

## 2017-04-27 MED ORDER — BISACODYL 10 MG RE SUPP: 10.0000 mg | Freq: Every day | RECTAL | Status: DC | PRN

## 2017-04-27 MED ORDER — ACETAMINOPHEN 325 MG PO TABS: 650.0000 mg | ORAL_TABLET | Freq: Four times a day (QID) | ORAL | Status: DC | PRN

## 2017-04-27 MED ORDER — HYDROMORPHONE HCL 1 MG/ML IJ SOLN
0.2500 mg | INTRAMUSCULAR | Status: DC | PRN
  Administered 2017-04-27 (×2): 0.5 mg via INTRAVENOUS
  Filled 2017-04-27: qty 0.5

## 2017-04-27 MED ORDER — LEVOTHYROXINE SODIUM 50 MCG PO TABS
50.0000 ug | ORAL_TABLET | Freq: Every day | ORAL | Status: DC
  Administered 2017-04-28 – 2017-04-29 (×2): 50 ug via ORAL
  Filled 2017-04-27 (×2): qty 1

## 2017-04-27 MED ORDER — PROMETHAZINE HCL 25 MG/ML IJ SOLN: 6.2500 mg | INTRAMUSCULAR | Status: DC | PRN

## 2017-04-27 MED ORDER — ONDANSETRON HCL 4 MG/2ML IJ SOLN
INTRAMUSCULAR | Status: DC | PRN
  Administered 2017-04-27: 4 mg via INTRAVENOUS

## 2017-04-27 MED ORDER — LOPERAMIDE HCL 2 MG PO CAPS: 2.0000 mg | ORAL_CAPSULE | Freq: Four times a day (QID) | ORAL | Status: DC | PRN

## 2017-04-27 MED ORDER — ONDANSETRON HCL 4 MG/2ML IJ SOLN
4.0000 mg | Freq: Four times a day (QID) | INTRAMUSCULAR | Status: DC | PRN
Start: 2017-04-27 — End: 2017-04-29
  Administered 2017-04-28: 4 mg via INTRAVENOUS
  Filled 2017-04-27: qty 2

## 2017-04-27 MED ORDER — TRANEXAMIC ACID 1000 MG/10ML IV SOLN
1000.0000 mg | INTRAVENOUS | Status: AC
  Administered 2017-04-27: 1000 mg via INTRAVENOUS
  Filled 2017-04-27: qty 10
  Filled 2017-04-27: qty 1100

## 2017-04-27 MED ORDER — BUPIVACAINE HCL (PF) 0.75 % IJ SOLN
INTRAMUSCULAR | Status: DC | PRN
  Administered 2017-04-27: 2 mL via INTRATHECAL

## 2017-04-27 MED ORDER — CEFAZOLIN SODIUM-DEXTROSE 2-4 GM/100ML-% IV SOLN
INTRAVENOUS | Status: AC
  Filled 2017-04-27: qty 100

## 2017-04-27 MED ORDER — PHENYLEPHRINE HCL 10 MG/ML IJ SOLN
INTRAMUSCULAR | Status: DC | PRN
  Administered 2017-04-27: 25 ug/min via INTRAVENOUS

## 2017-04-27 MED ORDER — ACETAMINOPHEN 650 MG RE SUPP: 650.0000 mg | Freq: Four times a day (QID) | RECTAL | Status: DC | PRN

## 2017-04-27 MED ORDER — FLEET ENEMA 7-19 GM/118ML RE ENEM: 1.0000 | ENEMA | Freq: Once | RECTAL | Status: DC | PRN

## 2017-04-27 MED ORDER — DOCUSATE SODIUM 100 MG PO CAPS
100.0000 mg | ORAL_CAPSULE | Freq: Two times a day (BID) | ORAL | Status: DC
  Administered 2017-04-27 – 2017-04-28 (×3): 100 mg via ORAL
  Filled 2017-04-27 (×3): qty 1

## 2017-04-27 MED ORDER — ONDANSETRON HCL 4 MG PO TABS: 4.0000 mg | ORAL_TABLET | Freq: Four times a day (QID) | ORAL | Status: DC | PRN

## 2017-04-27 MED ORDER — SODIUM CHLORIDE 0.9 % IV SOLN
INTRAVENOUS | Status: DC
  Administered 2017-04-27: 15:00:00 via INTRAVENOUS

## 2017-04-27 MED ORDER — ACETAMINOPHEN 10 MG/ML IV SOLN
INTRAVENOUS | Status: AC
  Filled 2017-04-27: qty 100

## 2017-04-27 MED ORDER — DEXAMETHASONE SODIUM PHOSPHATE 10 MG/ML IJ SOLN
INTRAMUSCULAR | Status: AC
  Filled 2017-04-27: qty 1

## 2017-04-27 MED ORDER — DEXAMETHASONE SODIUM PHOSPHATE 10 MG/ML IJ SOLN
10.0000 mg | Freq: Once | INTRAMUSCULAR | Status: AC
  Administered 2017-04-27: 10 mg via INTRAVENOUS

## 2017-04-27 MED ORDER — ACETAMINOPHEN 10 MG/ML IV SOLN
1000.0000 mg | Freq: Once | INTRAVENOUS | Status: AC
  Administered 2017-04-27: 1000 mg via INTRAVENOUS

## 2017-04-27 MED ORDER — CEFAZOLIN SODIUM-DEXTROSE 2-4 GM/100ML-% IV SOLN
2.0000 g | INTRAVENOUS | Status: AC
  Administered 2017-04-27: 2 g via INTRAVENOUS

## 2017-04-27 MED ORDER — SODIUM CHLORIDE 0.9 % IR SOLN
Status: DC | PRN
  Administered 2017-04-27: 1000 mL

## 2017-04-27 MED ORDER — PRAVASTATIN SODIUM 20 MG PO TABS
80.0000 mg | ORAL_TABLET | Freq: Every evening | ORAL | Status: DC
  Administered 2017-04-28: 80 mg via ORAL
  Filled 2017-04-27 (×2): qty 4

## 2017-04-27 MED ORDER — LIDOCAINE 2% (20 MG/ML) 5 ML SYRINGE
INTRAMUSCULAR | Status: AC
  Filled 2017-04-27: qty 5

## 2017-04-27 MED ORDER — ALPRAZOLAM 0.5 MG PO TABS
0.5000 mg | ORAL_TABLET | Freq: Two times a day (BID) | ORAL | Status: DC
  Administered 2017-04-27 – 2017-04-29 (×4): 0.5 mg via ORAL
  Filled 2017-04-27 (×4): qty 1

## 2017-04-27 MED ORDER — METOCLOPRAMIDE HCL 5 MG/ML IJ SOLN: 5.0000 mg | Freq: Three times a day (TID) | INTRAMUSCULAR | Status: DC | PRN

## 2017-04-27 MED ORDER — TRANEXAMIC ACID 1000 MG/10ML IV SOLN
1000.0000 mg | Freq: Once | INTRAVENOUS | Status: AC
  Administered 2017-04-27: 1000 mg via INTRAVENOUS
  Filled 2017-04-27: qty 1100

## 2017-04-27 MED ORDER — METOPROLOL TARTRATE 50 MG PO TABS
50.0000 mg | ORAL_TABLET | Freq: Two times a day (BID) | ORAL | Status: DC
  Administered 2017-04-28 – 2017-04-29 (×2): 50 mg via ORAL
  Filled 2017-04-27 (×4): qty 1

## 2017-04-27 MED ORDER — APIXABAN 2.5 MG PO TABS
2.5000 mg | ORAL_TABLET | Freq: Two times a day (BID) | ORAL | Status: DC
  Administered 2017-04-28 – 2017-04-29 (×3): 2.5 mg via ORAL
  Filled 2017-04-27 (×3): qty 1

## 2017-04-27 MED ORDER — POLYETHYLENE GLYCOL 3350 17 G PO PACK
17.0000 g | PACK | Freq: Every day | ORAL | Status: DC | PRN
Start: 2017-04-27 — End: 2017-04-29

## 2017-04-27 MED ORDER — PROPOFOL 500 MG/50ML IV EMUL
INTRAVENOUS | Status: DC | PRN
  Administered 2017-04-27: 50 ug/kg/min via INTRAVENOUS

## 2017-04-27 MED ORDER — FENTANYL CITRATE (PF) 100 MCG/2ML IJ SOLN
INTRAMUSCULAR | Status: DC | PRN
  Administered 2017-04-27: 50 ug via INTRAVENOUS

## 2017-04-27 MED ORDER — OXYCODONE HCL 5 MG PO TABS: 5.0000 mg | ORAL_TABLET | Freq: Once | ORAL | Status: DC | PRN

## 2017-04-27 MED ORDER — PHENOL 1.4 % MT LIQD: 1.0000 | OROMUCOSAL | Status: DC | PRN

## 2017-04-27 MED ORDER — BUDESONIDE 3 MG PO CPEP
3.0000 mg | ORAL_CAPSULE | Freq: Every day | ORAL | Status: DC
  Administered 2017-04-28 – 2017-04-29 (×2): 3 mg via ORAL
  Filled 2017-04-27 (×2): qty 1

## 2017-04-27 MED ORDER — BUPIVACAINE HCL (PF) 0.25 % IJ SOLN
INTRAMUSCULAR | Status: DC | PRN
  Administered 2017-04-27: 30 mL

## 2017-04-27 MED ORDER — MENTHOL 3 MG MT LOZG: 1.0000 | LOZENGE | OROMUCOSAL | Status: DC | PRN

## 2017-04-27 MED ORDER — CEFAZOLIN SODIUM-DEXTROSE 2-4 GM/100ML-% IV SOLN
2.0000 g | Freq: Four times a day (QID) | INTRAVENOUS | Status: AC
  Administered 2017-04-27 (×2): 2 g via INTRAVENOUS
  Filled 2017-04-27 (×2): qty 100

## 2017-04-27 MED ORDER — PROPOFOL 500 MG/50ML IV EMUL
INTRAVENOUS | Status: DC | PRN
  Administered 2017-04-27: 25 mg via INTRAVENOUS

## 2017-04-27 MED ORDER — OXYCODONE HCL 5 MG PO TABS
5.0000 mg | ORAL_TABLET | ORAL | Status: DC | PRN
  Administered 2017-04-27: 15:00:00 5 mg via ORAL
  Administered 2017-04-27: 10 mg via ORAL
  Administered 2017-04-27: 5 mg via ORAL
  Filled 2017-04-27 (×2): qty 1
  Filled 2017-04-27: qty 2

## 2017-04-27 MED ORDER — METHOCARBAMOL 1000 MG/10ML IJ SOLN
500.0000 mg | Freq: Four times a day (QID) | INTRAVENOUS | Status: DC | PRN
  Administered 2017-04-27: 500 mg via INTRAVENOUS
  Filled 2017-04-27: qty 550

## 2017-04-27 MED ORDER — ACETAMINOPHEN 500 MG PO TABS
1000.0000 mg | ORAL_TABLET | Freq: Four times a day (QID) | ORAL | Status: AC
  Administered 2017-04-27 – 2017-04-28 (×3): 1000 mg via ORAL
  Filled 2017-04-27 (×4): qty 2

## 2017-04-27 MED ORDER — CHLORHEXIDINE GLUCONATE 4 % EX LIQD: 60.0000 mL | Freq: Once | CUTANEOUS | Status: DC

## 2017-04-27 MED ORDER — ONDANSETRON HCL 4 MG/2ML IJ SOLN
INTRAMUSCULAR | Status: AC
  Filled 2017-04-27: qty 2

## 2017-04-27 SURGICAL SUPPLY — 36 items
BAG DECANTER FOR FLEXI CONT (MISCELLANEOUS) ×3 IMPLANT
BAG ZIPLOCK 12X15 (MISCELLANEOUS) IMPLANT
BLADE SAG 18X100X1.27 (BLADE) ×3 IMPLANT
CAPT HIP TOTAL 2 ×3 IMPLANT
CLOSURE WOUND 1/2 X4 (GAUZE/BANDAGES/DRESSINGS) ×1
CLOTH BEACON ORANGE TIMEOUT ST (SAFETY) ×3 IMPLANT
COVER PERINEAL POST (MISCELLANEOUS) ×3 IMPLANT
COVER SURGICAL LIGHT HANDLE (MISCELLANEOUS) ×3 IMPLANT
DECANTER SPIKE VIAL GLASS SM (MISCELLANEOUS) ×3 IMPLANT
DRAPE STERI IOBAN 125X83 (DRAPES) ×3 IMPLANT
DRAPE U-SHAPE 47X51 STRL (DRAPES) ×6 IMPLANT
DRSG ADAPTIC 3X8 NADH LF (GAUZE/BANDAGES/DRESSINGS) ×3 IMPLANT
DRSG MEPILEX BORDER 4X4 (GAUZE/BANDAGES/DRESSINGS) ×3 IMPLANT
DRSG MEPILEX BORDER 4X8 (GAUZE/BANDAGES/DRESSINGS) ×3 IMPLANT
DURAPREP 26ML APPLICATOR (WOUND CARE) ×3 IMPLANT
ELECT REM PT RETURN 15FT ADLT (MISCELLANEOUS) ×3 IMPLANT
EVACUATOR 1/8 PVC DRAIN (DRAIN) ×3 IMPLANT
GLOVE BIO SURGEON STRL SZ7.5 (GLOVE) ×3 IMPLANT
GLOVE BIO SURGEON STRL SZ8 (GLOVE) ×3 IMPLANT
GLOVE BIOGEL PI IND STRL 7.5 (GLOVE) ×4 IMPLANT
GLOVE BIOGEL PI IND STRL 8 (GLOVE) ×4 IMPLANT
GLOVE BIOGEL PI INDICATOR 7.5 (GLOVE) ×8
GLOVE BIOGEL PI INDICATOR 8 (GLOVE) ×8
GOWN STRL REUS W/TWL LRG LVL3 (GOWN DISPOSABLE) ×3 IMPLANT
GOWN STRL REUS W/TWL XL LVL3 (GOWN DISPOSABLE) ×3 IMPLANT
PACK ANTERIOR HIP CUSTOM (KITS) ×3 IMPLANT
STRIP CLOSURE SKIN 1/2X4 (GAUZE/BANDAGES/DRESSINGS) ×2 IMPLANT
SUT ETHIBOND NAB CT1 #1 30IN (SUTURE) ×3 IMPLANT
SUT MNCRL AB 4-0 PS2 18 (SUTURE) ×3 IMPLANT
SUT STRATAFIX 0 PDS 27 VIOLET (SUTURE) ×3
SUT VIC AB 2-0 CT1 27 (SUTURE) ×4
SUT VIC AB 2-0 CT1 TAPERPNT 27 (SUTURE) ×2 IMPLANT
SUTURE STRATFX 0 PDS 27 VIOLET (SUTURE) ×1 IMPLANT
SYR 50ML LL SCALE MARK (SYRINGE) IMPLANT
TRAY FOLEY W/METER SILVER 16FR (SET/KITS/TRAYS/PACK) ×3 IMPLANT
YANKAUER SUCT BULB TIP 10FT TU (MISCELLANEOUS) ×3 IMPLANT

## 2017-04-27 NOTE — Interval H&P Note (Signed)
History and Physical Interval Note:  04/27/2017 7:16 AM  Diana Ramsey  has presented today for surgery, with the diagnosis of Osteoarthritis left hip  The various methods of treatment have been discussed with the patient and family. After consideration of risks, benefits and other options for treatment, the patient has consented to  Procedure(s): LEFT TOTAL HIP ARTHROPLASTY ANTERIOR APPROACH (Left) as a surgical intervention .  The patient's history has been reviewed, patient examined, no change in status, stable for surgery.  I have reviewed the patient's chart and labs.  Questions were answered to the patient's satisfaction.     Gearlean Alf

## 2017-04-27 NOTE — Op Note (Signed)
OPERATIVE REPORT- TOTAL HIP ARTHROPLASTY   PREOPERATIVE DIAGNOSIS: Osteoarthritis of the Left hip.   POSTOPERATIVE DIAGNOSIS: Osteoarthritis of the Left  hip.   PROCEDURE: Left total hip arthroplasty, anterior approach.   SURGEON: Gaynelle Arabian, MD   ASSISTANT: Arlee Muslim, PA-C  ANESTHESIA:  Spinal  ESTIMATED BLOOD LOSS:-700 ml    DRAINS: Hemovac x1.   COMPLICATIONS: None   CONDITION: PACU - hemodynamically stable.   BRIEF CLINICAL NOTE: Diana Ramsey is a 82 y.o. female who has advanced end-  stage arthritis of their Left  hip with progressively worsening pain and  dysfunction.The patient has failed nonoperative management and presents for  total hip arthroplasty.   PROCEDURE IN DETAIL: After successful administration of spinal  anesthetic, the traction boots for the [facility] bed were placed on both  feet and the patient was placed onto the [facility] bed, boots placed into the leg  holders. The Left hip was then isolated from the perineum with plastic  drapes and prepped and draped in the usual sterile fashion. ASIS and  greater trochanter were marked and a oblique incision was made, starting  at about 1 cm lateral and 2 cm distal to the ASIS and coursing towards  the anterior cortex of the femur. The skin was cut with a 10 blade  through subcutaneous tissue to the level of the fascia overlying the  tensor fascia lata muscle. The fascia was then incised in line with the  incision at the junction of the anterior third and posterior 2/3rd. The  muscle was teased off the fascia and then the interval between the TFL  and the rectus was developed. The Hohmann retractor was then placed at  the top of the femoral neck over the capsule. The vessels overlying the  capsule were cauterized and the fat on top of the capsule was removed.  A Hohmann retractor was then placed anterior underneath the rectus  femoris to give exposure to the entire anterior capsule. A T-shaped   capsulotomy was performed. The edges were tagged and the femoral head  was identified.       Osteophytes are removed off the superior acetabulum.  The femoral neck was then cut in situ with an oscillating saw. Traction  was then applied to the left lower extremity utilizing the [facility]  traction. The femoral head was then removed. Retractors were placed  around the acetabulum and then circumferential removal of the labrum was  performed. Osteophytes were also removed. Reaming starts at 43 mm to  medialize and  Increased in 2 mm increments to 47 mm. We reamed in  approximately 40 degrees of abduction, 20 degrees anteversion. A 48 mm  pinnacle acetabular shell was then impacted in anatomic position under  fluoroscopic guidance with excellent purchase. We did not need to place  any additional dome screws. A 28 mm neutral + 4 marathon liner was then  placed into the acetabular shell.       The femoral lift was then placed along the lateral aspect of the femur  just distal to the vastus ridge. The leg was  externally rotated and capsule  was stripped off the inferior aspect of the femoral neck down to the  level of the lesser trochanter, this was done with electrocautery. The femur was lifted after this was performed. The  leg was then placed in an extended and adducted position essentially delivering the femur. We also removed the capsule superiorly and the piriformis from the piriformis  fossa to gain excellent exposure of the  proximal femur. Rongeur was used to remove some cancellous bone to get  into the lateral portion of the proximal femur for placement of the  initial starter reamer. The starter broaches was placed  the starter broach  and was shown to go down the center of the canal. Broaching  with the  Corail system was then performed starting at size 8, coursing  Up to size 12. A size 12 had excellent torsional and rotational  and axial stability. The trial high offset neck was then  placed  with a 28 + 1.5 trial head. The hip was then reduced. We confirmed that  the stem was in the canal both on AP and lateral x-rays. It also has excellent sizing. The hip was reduced with outstanding stability through full extension and full external rotation.. AP pelvis was taken and the leg lengths were measured and found to be equal. Hip was then dislocated again and the femoral head and neck removed. The  femoral broach was removed. Size 12 Corail stem with a high offset  neck was then impacted into the femur following native anteversion. Has  excellent purchase in the canal. Excellent torsional and rotational and  axial stability. It is confirmed to be in the canal on AP and lateral  fluoroscopic views. The 28 + 1.5 ceramic head was placed and the hip  reduced with outstanding stability. Again AP pelvis was taken and it  confirmed that the leg lengths were equal. The wound was then copiously  irrigated with saline solution and the capsule reattached and repaired  with Ethibond suture. 30 ml of .25% Bupivicaine was  injected into the capsule and into the edge of the tensor fascia lata as well as subcutaneous tissue. The fascia overlying the tensor fascia lata was then closed with a running #1 V-Loc. Subcu was closed with interrupted 2-0 Vicryl and subcuticular running 4-0 Monocryl. Incision was cleaned  and dried. Steri-Strips and a bulky sterile dressing applied. Hemovac  drain was hooked to suction and then the patient was awakened and transported to  recovery in stable condition.        Please note that a surgical assistant was a medical necessity for this procedure to perform it in a safe and expeditious manner. Assistant was necessary to provide appropriate retraction of vital neurovascular structures and to prevent femoral fracture and allow for anatomic placement of the prosthesis.  Gaynelle Arabian, M.D.

## 2017-04-27 NOTE — Anesthesia Preprocedure Evaluation (Addendum)
Anesthesia Evaluation  Patient identified by MRN, date of birth, ID band Patient awake    Reviewed: Allergy & Precautions, NPO status , Patient's Chart, lab work & pertinent test results  Airway Mallampati: II  TM Distance: >3 FB Neck ROM: Full    Dental no notable dental hx. (+) Implants   Pulmonary neg pulmonary ROS, former smoker,    Pulmonary exam normal breath sounds clear to auscultation       Cardiovascular hypertension, Pt. on medications Normal cardiovascular exam Rhythm:Regular Rate:Normal     Neuro/Psych Anxiety Depression negative neurological ROS  negative psych ROS   GI/Hepatic negative GI ROS, Neg liver ROS,   Endo/Other  Hypothyroidism   Renal/GU negative Renal ROS  negative genitourinary   Musculoskeletal negative musculoskeletal ROS (+)   Abdominal   Peds negative pediatric ROS (+)  Hematology negative hematology ROS (+)   Anesthesia Other Findings   Reproductive/Obstetrics negative OB ROS                             Anesthesia Physical  Anesthesia Plan  ASA: II  Anesthesia Plan: Spinal   Post-op Pain Management:    Induction: Intravenous  PONV Risk Score and Plan:   Airway Management Planned: Simple Face Mask  Additional Equipment:   Intra-op Plan:   Post-operative Plan:   Informed Consent: I have reviewed the patients History and Physical, chart, labs and discussed the procedure including the risks, benefits and alternatives for the proposed anesthesia with the patient or authorized representative who has indicated his/her understanding and acceptance.   Dental advisory given  Plan Discussed with: CRNA  Anesthesia Plan Comments:         Anesthesia Quick Evaluation

## 2017-04-27 NOTE — Anesthesia Postprocedure Evaluation (Signed)
Anesthesia Post Note  Patient: Diana Ramsey  Procedure(s) Performed: Procedure(s) (LRB): LEFT TOTAL HIP ARTHROPLASTY ANTERIOR APPROACH (Left)     Patient location during evaluation: PACU Anesthesia Type: Spinal Level of consciousness: oriented and awake and alert Pain management: pain level controlled Vital Signs Assessment: post-procedure vital signs reviewed and stable Respiratory status: spontaneous breathing and respiratory function stable Cardiovascular status: blood pressure returned to baseline and stable Postop Assessment: no headache and no backache Anesthetic complications: no    Last Vitals:  Vitals:   04/27/17 1132 04/27/17 1200  BP: (!) 116/50 (!) 110/49  Pulse: 64 (!) 56  Resp: 12 11  Temp: 36.2 C     Last Pain:  Vitals:   04/27/17 1200  TempSrc:   PainSc: [place]

## 2017-04-27 NOTE — H&P (View-Only) (Signed)
Diana Ramsey DOB: October 21, 1935 Single / Language: Diana Ramsey / Race: White Female Date of Admission:  04/27/2017 CC:  Left Hip pain History of Present Illness  The patient is a 82 year old female who comes in  for a preoperative History and Physical. The patient is scheduled for a left total hip arthroplasty (anterior) to be performed by Dr. Dione Plover. Aluisio, MD at [facility] on 04-27-2017. The patient is a 82 year old female who presented for follow up of their hip. The patient is being followed for their left hip pain and osteoarthritis. They are month(s) out from IA injection. Symptoms reported include: pain, pain at night, aching, stiffness, pain with weightbearing, difficulty ambulating and difficulty arising from chair. The patient feels that they are doing poorly and report their pain level to be moderate to severe (pain varies). The following medication has been used for pain control: Tylenol (Arthritis strength) and Ultram (given PCP for feet issues). The patient has not gotten any relief of their symptoms with Cortisone injections (relief for about 1 month). Diana Ramsey left hip again is progressively worse. When she had an intraarticular injection five months ago she had a couple of weeks where it helped, but then the pain started to come back. It is getting progressively worse now. It is in the groin anterior thigh. It is very similar to the pain she experienced when she had the total hip on the right. She is having worsening function and worsening pain. It is felt that she would benefit from undergoing a left total hip replacement. They have been treated conservatively in the past for the above stated problem and despite conservative measures, they continue to have progressive pain and severe functional limitations and dysfunction. They have failed non-operative management including home exercise, medications, and injections. It is felt that they would benefit from undergoing total  joint replacement. Risks and benefits of the procedure have been discussed with the patient and they elect to proceed with surgery. There are no active contraindications to surgery such as ongoing infection or rapidly progressive neurological disease.  Problem List/Past Medical Stress fracture of left femur with routine healing (M84.352D)  Primary localized osteoarthritis of left hip (M16.12)  Status post right hip replacement (I77.824)  Hypothyroidism  High blood pressure  Ulcerative Colitis  flareups every few months Hypercholesterolemia  Anxiety Disorder  Depression  Pneumonia  Past History - Last bout October 2015 Varicose veins  Osteoporosis  Measles  Mumps  Peripheral Neuropathy  Feet  Allergies  Penicillins  Rash. Latex  Rash, Itching. Chocolate  GI Upset  Family History  Hypertension  Father, Mother. Cancer  Father. Liver Disease, Chronic  Father.  Social History Tobacco use  Former smoker. 05/02/2014: smoke(d) less than 1/2 pack(s) per day Living situation  live alone Exercise  Exercises weekly; does running / walking Current drinker  05/02/2014: Currently drinks wine only occasionally per week Children  1 Current work status  retired Tobacco / smoke exposure  05/02/2014: no Marital status  widowed Not under pain contract  No history of drug/alcohol rehab  Number of flights of stairs before winded  4-5 [place] with son Advance Directives  Living Will, Healthcare POA  Medication History  Aleve (220MG  Tablet, Oral as needed) Active. Tylenol (prn) Active. Pravastatin Sodium (80MG  Tablet, Oral daily) Active. Metoprolol Tartrate (50MG  Tablet, Oral two times daily) Active. Levothyroxine Sodium (50MCG Tablet, Oral daily) Active. Mirtazapine (30MG  Tablet, Oral daily) Active. TraMADol HCl (50MG  Tablet, Oral) Active.  ALPRAZolam (0.5MG  Tablet, Oral two times daily) Active. Aspirin (81MG  Tablet, 1 (one)  Oral) Active. B12 injection Active.  Past Surgical History Hysterectomy  Date: 35. partial (non-cancerous) Total Hip Replacement - Right  Date: 2016. EGD   Review of Systems General Not Present- Chills, Fatigue, Fever, Memory Loss, Night Sweats, Weight Gain and Weight Loss. Skin Not Present- Eczema, Hives, Itching, Lesions and Rash. HEENT Not Present- Dentures, Double Vision, Headache, Hearing Loss, Tinnitus and Visual Loss. Respiratory Not Present- Allergies, Chronic Cough, Coughing up blood, Shortness of breath at rest and Shortness of breath with exertion. Cardiovascular Not Present- Chest Pain, Difficulty Breathing Lying Down, Murmur, Palpitations, Racing/skipping heartbeats and Swelling. Gastrointestinal Not Present- Abdominal Pain, Bloody Stool, Constipation, Diarrhea, Difficulty Swallowing, Heartburn, Jaundice, Loss of appetitie, Nausea and Vomiting. Female Genitourinary Not Present- Blood in Urine, Discharge, Flank Pain, Incontinence, Painful Urination, Urgency, Urinary frequency, Urinary Retention, Urinating at Night and Weak urinary stream. Musculoskeletal Present- Joint Pain. Not Present- Back Pain, Joint Swelling, Morning Stiffness, Muscle Pain, Muscle Weakness and Spasms. Neurological Not Present- Blackout spells, Difficulty with balance, Dizziness, Paralysis, Tremor and Weakness. Psychiatric Not Present- Insomnia.  Vitals  Weight: 145 lb Height: 61in Weight was reported by patient. Height was reported by patient. Body Surface Area: 1.65 m Body Mass Index: 27.4 kg/m  Pulse: 60 (Regular)  Resp.: 12 (Unlabored)  BP: 162/74 (Sitting, Left Arm, Standard)  Physical Exam General Mental Status -Alert, cooperative and good historian. General Appearance-pleasant, Not in acute distress. Orientation-Oriented X3. Build & Nutrition-Well nourished and Well developed.  Head and Neck Head-normocephalic, atraumatic . Neck Global Assessment - supple, no  bruit auscultated on the right, no bruit auscultated on the left.  Eye Pupil - Bilateral-Regular and Round. Motion - Bilateral-EOMI.  Chest and Lung Exam Auscultation Breath sounds - clear at anterior chest wall and clear at posterior chest wall. Adventitious sounds - No Adventitious sounds.  Cardiovascular Auscultation Rhythm - Regular rate and rhythm. Heart Sounds - S1 WNL and S2 WNL. Murmurs & Other Heart Sounds - Auscultation of the heart reveals - No Murmurs.  Abdomen Palpation/Percussion Tenderness - Abdomen is non-tender to palpation. Rigidity (guarding) - Abdomen is soft. Auscultation Auscultation of the abdomen reveals - Bowel sounds normal.  Female Genitourinary Note: Not done, not pertinent to present illness   Musculoskeletal Note: On exam, she is in no distress. Her left hip can be flexed to 100. Minimal internal rotation, about 10 to 20 of external rotation, 20 of abduction. She does have pain on range of motion of that hip.  RADIOGRAPHS AP pelvis, lateral of left hip show that she is not completely bone-on-bone. She has slight protrusio and has central bone-on-bone, but superiorly the space left. She does have healing of a previous stress fracture in a lateral cortex of the diaphysis to the femur.  Assessment & Plan  Status post right hip replacement (Q76.195) Primary localized osteoarthritis of left hip (M16.12)  Note:Surgical Plans: Left Total Hip Replacement - Anterior Approach  Disposition: Home with help from son, No formal therapy, HEP - exercise sheet willbe provided.  PCP: Dr. Kennith Maes - Patient has been seen preoperatively and felt to be stable for surgery. Previous Outpatient Labs (12/20/2016) WBC - 7.7 HGB - 12.6 HCT - 36.7 PLT - 173 Lytes - WNL BUN - 25 (H) Creat - 1.4 (H) Free T-4 - 1.04 TSH - 1.150  IV TXA  Anesthesia Issues: None  Patient was instructed on what medications to stop prior to surgery.  Signed  electronically by Joelene Millin, III PA-C

## 2017-04-27 NOTE — Discharge Instructions (Addendum)
° °Dr. Frank Aluisio °Total Joint Specialist °[facility] °[address] °[place], [place] [address] °[phone] ° °ANTERIOR APPROACH TOTAL HIP REPLACEMENT POSTOPERATIVE DIRECTIONS ° ° °Hip Rehabilitation, Guidelines Following Surgery  °The results of a hip operation are greatly improved after range of motion and muscle strengthening exercises. Follow all safety measures which are given to protect your hip. If any of these exercises cause increased pain or swelling in your joint, decrease the amount until you are comfortable again. Then slowly increase the exercises. Call your caregiver if you have problems or questions.  ° °HOME CARE INSTRUCTIONS  °Remove items at home which could result in a fall. This includes throw rugs or furniture in walking pathways.  °· ICE to the affected hip every three hours for 30 minutes at a time and then as needed for pain and swelling.  Continue to use ice on the hip for pain and swelling from surgery. You may notice swelling that will progress down to the foot and ankle.  This is normal after surgery.  Elevate the leg when you are not up walking on it.   °· Continue to use the breathing machine which will help keep your temperature down.  It is common for your temperature to cycle up and down following surgery, especially at night when you are not up moving around and exerting yourself.  The breathing machine keeps your lungs expanded and your temperature down. ° ° °DIET °You may resume your previous home diet once your are discharged from the hospital. ° °DRESSING / WOUND CARE / SHOWERING °You may shower 3 days after surgery, but keep the wounds dry during showering.  You may use an occlusive plastic wrap (Press'n Seal for example), NO SOAKING/SUBMERGING IN THE BATHTUB.  If the bandage gets wet, change with a clean dry gauze.  If the incision gets wet, pat the wound dry with a clean towel. °You may start showering once you are discharged home but do not  submerge the incision under water. Just pat the incision dry and apply a dry gauze dressing on daily. °Change the surgical dressing daily and reapply a dry dressing each time. ° °ACTIVITY °Walk with your walker as instructed. °Use walker as long as suggested by your caregivers. °Avoid periods of inactivity such as sitting longer than an hour when not asleep. This helps prevent blood clots.  °You may resume a sexual relationship in one month or when given the OK by your doctor.  °You may return to work once you are cleared by your doctor.  °Do not drive a car for 6 weeks or until released by you surgeon.  °Do not drive while taking narcotics. ° °WEIGHT BEARING °Weight bearing as tolerated with assist device (walker, cane, etc) as directed, use it as long as suggested by your surgeon or therapist, typically at least 4-6 weeks. ° °POSTOPERATIVE CONSTIPATION PROTOCOL °Constipation - defined medically as fewer than three stools per week and severe constipation as less than one stool per week. ° °One of the most common issues patients have following surgery is constipation.  Even if you have a regular bowel pattern at home, your normal regimen is likely to be disrupted due to multiple reasons following surgery.  Combination of anesthesia, postoperative narcotics, change in appetite and fluid intake all can affect your bowels.  In order to avoid complications following surgery, here are some recommendations in order to help you during your recovery period. ° °Colace (docusate) - Pick up an over-the-counter   form of Colace or another stool softener and take twice a day as long as you are requiring postoperative pain medications.  Take with a full glass of water daily.  If you experience loose stools or diarrhea, hold the colace until you stool forms back up.  If your symptoms do not get better within 1 week or if they get worse, check with your doctor. ° °Dulcolax (bisacodyl) - Pick up over-the-counter and take as directed  by the product packaging as needed to assist with the movement of your bowels.  Take with a full glass of water.  Use this product as needed if not relieved by Colace only.  ° °MiraLax (polyethylene glycol) - Pick up over-the-counter to have on hand.  MiraLax is a solution that will increase the amount of water in your bowels to assist with bowel movements.  Take as directed and can mix with a glass of water, juice, soda, coffee, or tea.  Take if you go more than two days without a movement. °Do not use MiraLax more than once per day. Call your doctor if you are still constipated or irregular after using this medication for 7 days in a row. ° °If you continue to have problems with postoperative constipation, please contact the office for further assistance and recommendations.  If you experience "the worst abdominal pain ever" or develop nausea or vomiting, please contact the office immediatly for further recommendations for treatment. ° °ITCHING ° If you experience itching with your medications, try taking only a single pain pill, or even half a pain pill at a time.  You can also use Benadryl over the counter for itching or also to help with sleep.  ° °TED HOSE STOCKINGS °Wear the elastic stockings on both legs for three weeks following surgery during the day but you may remove then at night for sleeping. ° °MEDICATIONS °See your medication summary on the “After Visit Summary” that the nursing staff will review with you prior to discharge.  You may have some home medications which will be placed on hold until you complete the course of blood thinner medication.  It is important for you to complete the blood thinner medication as prescribed by your surgeon.  Continue your approved medications as instructed at time of discharge. ° °PRECAUTIONS °If you experience chest pain or shortness of breath - call 911 immediately for transfer to the hospital emergency department.  °If you develop a fever greater that 101 F,  purulent drainage from wound, increased redness or drainage from wound, foul odor from the wound/dressing, or calf pain - CONTACT YOUR SURGEON.   °                                                °FOLLOW-UP APPOINTMENTS °Make sure you keep all of your appointments after your operation with your surgeon and caregivers. You should call the office at the above phone number and make an appointment for approximately two weeks after the date of your surgery or on the date instructed by your surgeon outlined in the "After Visit Summary". ° °RANGE OF MOTION AND STRENGTHENING EXERCISES  °These exercises are designed to help you keep full movement of your hip joint. Follow your caregiver's or physical therapist's instructions. Perform all exercises about fifteen times, three times per day or as directed. Exercise both hips, even if you   have had only one joint replacement. These exercises can be done on a training (exercise) mat, on the floor, on a table or on a bed. Use whatever works the best and is most comfortable for you. Use music or television while you are exercising so that the exercises are a pleasant break in your day. This will make your life better with the exercises acting as a break in routine you can look forward to.  Lying on your back, slowly slide your foot toward your buttocks, raising your knee up off the floor. Then slowly slide your foot back down until your leg is straight again.  Lying on your back spread your legs as far apart as you can without causing discomfort.  Lying on your side, raise your upper leg and foot straight up from the floor as far as is comfortable. Slowly lower the leg and repeat.  Lying on your back, tighten up the muscle in the front of your thigh (quadriceps muscles). You can do this by keeping your leg straight and trying to raise your heel off the floor. This helps strengthen the largest muscle supporting your knee.  Lying on your back, tighten up the muscles of your  buttocks both with the legs straight and with the knee bent at a comfortable angle while keeping your heel on the floor.   IF YOU ARE TRANSFERRED TO A SKILLED REHAB FACILITY If the patient is transferred to a skilled rehab facility following release from the hospital, a list of the current medications will be sent to the facility for the patient to continue.  When discharged from the skilled rehab facility, please have the facility set up the patient's [place] prior to being released. Also, the skilled facility will be responsible for providing the patient with their medications at time of release from the facility to include their pain medication, the muscle relaxants, and their blood thinner medication. If the patient is still at the rehab facility at time of the two week follow up appointment, the skilled rehab facility will also need to assist the patient in arranging follow up appointment in our office and any transportation needs.  MAKE SURE YOU:  Understand these instructions.  Get help right away if you are not doing well or get worse.    Pick up stool softner and laxative for home use following surgery while on pain medications. Do not submerge incision under water. Please use good hand washing techniques while changing dressing each day. May shower starting three days after surgery. Please use a clean towel to pat the incision dry following showers. Continue to use ice for pain and swelling after surgery. Do not use any lotions or creams on the incision until instructed by your surgeon.  Take Eliquis twice a day for a total of three weeks.  After three weeks, discontinue the Eliquis and then reduce back to a baby 81 mg Aspirin daily at home.  Information on my medicine - ELIQUIS (apixaban)  Why was Eliquis prescribed for you? Eliquis was prescribed for you to reduce the risk of blood clots forming after orthopedic surgery.    What do You need to know about  Eliquis? Take your Eliquis TWICE DAILY - one tablet in the morning and one tablet in the evening with or without food.  It would be best to take the dose about the same time each day.  If you have difficulty swallowing the tablet whole please discuss with your pharmacist how to  take the medication safely.  Take Eliquis exactly as prescribed by your doctor and DO NOT stop taking Eliquis without talking to the doctor who prescribed the medication.  Stopping without other medication to take the place of Eliquis may increase your risk of developing a clot.  After discharge, you should have regular check-up appointments with your healthcare provider that is prescribing your Eliquis.  What do you do if you miss a dose? If a dose of ELIQUIS is not taken at the scheduled time, take it as soon as possible on the same day and twice-daily administration should be resumed.  The dose should not be doubled to make up for a missed dose.  Do not take more than one tablet of ELIQUIS at the same time.  Important Safety Information A possible side effect of Eliquis is bleeding. You should call your healthcare provider right away if you experience any of the following: ? Bleeding from an injury or your nose that does not stop. ? Unusual colored urine (red or dark brown) or unusual colored stools (red or black). ? Unusual bruising for unknown reasons. ? A serious fall or if you hit your head (even if there is no bleeding).  Some medicines may interact with Eliquis and might increase your risk of bleeding or clotting while on Eliquis. To help avoid this, consult your healthcare provider or pharmacist prior to using any new prescription or non-prescription medications, including herbals, vitamins, non-steroidal anti-inflammatory drugs (NSAIDs) and supplements.  This website has more information on Eliquis (apixaban): http://www.eliquis.com/eliquis/home

## 2017-04-27 NOTE — Progress Notes (Addendum)
PT Cancellation Note  Patient Details Name: Diana Ramsey MRN: [id number] DOB: 1935-04-22   Cancelled Treatment:    Reason Eval/Treat Not Completed: RN recommended PT be held today. Will eval tomorrow. Thanks.   Addendum: Also spoke with pt who politely requested PT check back in the a.m.    Weston Anna, MPT Pager: [phone]

## 2017-04-27 NOTE — Anesthesia Procedure Notes (Signed)
Spinal  Patient location during procedure: OR Start time: 04/27/2017 9:34 AM End time: 04/27/2017 9:41 AM Staffing Anesthesiologist: Candida Peeling RAY Preanesthetic Checklist Completed: patient identified, site marked, surgical consent, pre-op evaluation, timeout performed, IV checked, risks and benefits discussed and monitors and equipment checked Spinal Block Patient position: sitting Prep: DuraPrep Patient monitoring: heart rate, cardiac monitor, continuous pulse ox and blood pressure Approach: midline Location: L3-4 Injection technique: single-shot Needle Needle type: Tuohy  Needle gauge: 22 G Needle length: 9 cm

## 2017-04-27 NOTE — Transfer of Care (Signed)
Immediate Anesthesia Transfer of Care Note  Patient: Diana Ramsey  Procedure(s) Performed: Procedure(s): LEFT TOTAL HIP ARTHROPLASTY ANTERIOR APPROACH (Left)  Patient Location: PACU  Anesthesia Type:MAC and Spinal  Level of Consciousness: awake, alert  and oriented  Airway & Oxygen Therapy: Patient Spontanous Breathing and Patient connected to face mask oxygen  Post-op Assessment: Report given to RN and Post -op Vital signs reviewed and stable  Post vital signs: Reviewed and stable  Last Vitals:  Vitals:   04/27/17 0715  BP: (!) 171/86  Pulse: 69  Resp: 16  Temp: 36.6 C    Last Pain:  Vitals:   04/27/17 0715  TempSrc: Oral      Patients Stated Pain Goal: 3 (08/07/29 0762)  Complications: No apparent anesthesia complications

## 2017-04-28 LAB — BASIC METABOLIC PANEL
Anion gap: 9 (ref 5–15)
BUN: 19 mg/dL (ref 6–20)
CALCIUM: 8.5 mg/dL — AB (ref 8.9–10.3)
CO2: 23 mmol/L (ref 22–32)
Chloride: 105 mmol/L (ref 101–111)
Creatinine, Ser: 1.32 mg/dL — ABNORMAL HIGH (ref 0.44–1.00)
GFR, EST AFRICAN AMERICAN: 43 mL/min — AB (ref >60)
GFR, EST NON AFRICAN AMERICAN: 37 mL/min — AB (ref >60)
Glucose, Bld: 130 mg/dL — ABNORMAL HIGH (ref 65–99)
Potassium: 4.9 mmol/L (ref 3.5–5.1)
Sodium: 137 mmol/L (ref 135–145)

## 2017-04-28 LAB — CBC
HCT: 24.4 % — ABNORMAL LOW (ref 36.0–46.0)
Hemoglobin: 8.4 g/dL — ABNORMAL LOW (ref 12.0–15.0)
MCH: 35.6 pg — ABNORMAL HIGH (ref 26.0–34.0)
MCHC: 34.4 g/dL (ref 30.0–36.0)
MCV: 103.4 fL — ABNORMAL HIGH (ref 78.0–100.0)
PLATELETS: 153 10*3/uL (ref 150–400)
RBC: 2.36 MIL/uL — ABNORMAL LOW (ref 3.87–5.11)
RDW: 12.4 % (ref 11.5–15.5)
WBC: 13.5 10*3/uL — AB (ref 4.0–10.5)

## 2017-04-28 MED ORDER — HYDROCODONE-ACETAMINOPHEN 5-325 MG PO TABS
1.0000 | ORAL_TABLET | ORAL | Status: DC | PRN
  Administered 2017-04-28 (×3): 1 via ORAL
  Administered 2017-04-29: 10:00:00 2 via ORAL
  Filled 2017-04-28: qty 2
  Filled 2017-04-28 (×3): qty 1

## 2017-04-28 NOTE — Evaluation (Signed)
Physical Therapy Evaluation Patient Details Name: Diana Ramsey MRN: [id number] DOB: Feb 26, 1935 Today's Date: 04/28/2017   History of Present Illness  82 yo female s/p R THA-direct anterior. Hx of R THA 2016, osteoporosis, arthritis.   Clinical Impression  On eval, pt required Min assist for mobility. She was only able to tolerated a few steps in room and a stand pivot to the recliner. Pt c/o lightheadedness and shakiness. BP 104/38 after seated in recliner-RN aware. Will attempt ambulation during 2nd session. D/C plan is tentatively planned for tomorrow. Pt plans to return home without PT follow up.     Follow Up Recommendations DC plan and follow up therapy as arranged by surgeon    Equipment Recommendations  None recommended by PT    Recommendations for Other Services       Precautions / Restrictions Precautions Precautions: Fall Restrictions Weight Bearing Restrictions: No LLE Weight Bearing: Weight bearing as tolerated      Mobility  Bed Mobility Overal bed mobility: Needs Assistance Bed Mobility: Supine to Sit     Supine to sit: Min assist;HOB elevated     General bed mobility comments: Increased time. VCs safety, technique. Assist for trunk and L LE. Practiced getting up on L side of bed. Will plan to practice from R side as well.   Transfers Overall transfer level: Needs assistance Equipment used: Rolling walker (2 wheeled) Transfers: Sit to/from [facility] Sit to Stand: Min assist Stand pivot transfers: Min assist       General transfer comment: Sit to stand x 2. VCs safety, technique, hand/LE placement. Stand pivot, bed to recliner, with RW. Pt c/o feeling lightheaded and shaky   Ambulation/Gait Ambulation/Gait assistance: Min assist Ambulation Distance (Feet): 3 Feet Assistive device: Rolling walker (2 wheeled) Gait Pattern/deviations: Step-to pattern;Decreased stride length;Decreased step length - right;Decreased step length - left     General Gait Details: Assist to stabilize. Cues for sequence and step length. Pt c/o feeling shaky and lightheaded and requested to sit back down. Deferred further ambulation and assisted pt into recliner.   Stairs            Wheelchair Mobility    Modified Rankin (Stroke Patients Only)       Balance                                             Pertinent Vitals/Pain Pain Assessment: 0-10 Pain Score: 7  Pain Location: L hip/thigh Pain Descriptors / Indicators: Aching;Sore Pain Intervention(s): Limited activity within patient's tolerance;Repositioned    Home Living Family/patient expects to be discharged to:: Private residence Living Arrangements: Alone Available Help at Discharge: Family Type of Home: House Home Access: Stairs to enter Entrance Stairs-Rails: None Entrance Stairs-Number of Steps: 2 Home Layout: One level Home Equipment: Environmental consultant - 2 wheels;Bedside commode;Cane - single point      Prior Function Level of Independence: Independent               Hand Dominance        Extremity/Trunk Assessment   Upper Extremity Assessment Upper Extremity Assessment: Generalized weakness    Lower Extremity Assessment Lower Extremity Assessment:  (s/p L THA)    Cervical / Trunk Assessment Cervical / Trunk Assessment: Kyphotic  Communication   Communication: No difficulties  Cognition Arousal/Alertness: Awake/alert Behavior During Therapy: WFL for tasks assessed/performed Overall Cognitive Status: Within  Functional Limits for tasks assessed                                        General Comments      Exercises Total Joint Exercises Ankle Circles/Pumps: AROM;Both;10 reps;Supine Quad Sets: AROM;Both;10 reps;Supine Heel Slides: AAROM;Left;10 reps;Supine Hip ABduction/ADduction: AAROM;Left;10 reps;Supine   Assessment/Plan    PT Assessment Patient needs continued PT services  PT Problem List Decreased  strength;Decreased mobility;Decreased range of motion;Decreased activity tolerance;Decreased balance;Decreased knowledge of use of DME;Pain       PT Treatment Interventions DME instruction;Therapeutic activities;Gait training;Therapeutic exercise;Patient/family education;Balance training;Stair training;Functional mobility training    PT Goals (Current goals can be found in the Care Plan section)  Acute Rehab PT Goals Patient Stated Goal: home. regain independence. PT Goal Formulation: With patient Time For Goal Achievement: 05/12/17 Potential to Achieve Goals: Good    Frequency 7X/week   Barriers to discharge        Co-evaluation               AM-PAC PT "6 Clicks" Daily Activity  Outcome Measure Difficulty turning over in bed (including adjusting bedclothes, sheets and blankets)?: A Little Difficulty moving from lying on back to sitting on the side of the bed? : A Little Difficulty sitting down on and standing up from a chair with arms (e.g., wheelchair, bedside commode, etc,.)?: A Little Help needed moving to and from a bed to chair (including a wheelchair)?: A Little Help needed walking in hospital room?: A Little Help needed climbing 3-5 steps with a railing? : A Little 6 Click Score: 18    End of Session Equipment Utilized During Treatment: Gait belt Activity Tolerance: Patient limited by fatigue Patient left: in chair;with call bell/phone within reach;with family/visitor present   PT Visit Diagnosis: Muscle weakness (generalized) (M62.81);Difficulty in walking, not elsewhere classified (R26.2)    Time: 7741-4239 PT Time Calculation (min) (ACUTE ONLY): 24 min   Charges:   PT Evaluation $PT Eval Low Complexity: 1 Procedure PT Treatments $Therapeutic Activity: 8-22 mins   PT G Codes:          Weston Anna, MPT Pager: [phone]

## 2017-04-28 NOTE — Progress Notes (Signed)
Physical Therapy Treatment Patient Details Name: Diana Ramsey MRN: [id number] DOB: 1935/06/03 Today's Date: 04/28/2017    History of Present Illness 82 yo female s/p R THA-direct anterior. Hx of R THA 2016, osteoporosis, arthritis.     PT Comments    Progressing slowly with mobility. Ambulation distance limited by fatigue, shakiness, and lightheadedness. BP 102/80 immediately after ambulation. Son present during session. He stated pt needs to be able to walk ~50 feet to be able to make it to the bathroom at home. Will continue to follow and progress activity as tolerated. Feel pt could benefit from HHPT follow up.     Follow Up Recommendations  DC plan and follow up therapy as arranged by surgeon (could benefit from [place] f/u. Son only in town until [place]. )     Equipment Recommendations  None recommended by PT    Recommendations for Other Services       Precautions / Restrictions Precautions Precautions: Fall Restrictions Weight Bearing Restrictions: No LLE Weight Bearing: Weight bearing as tolerated    Mobility  Bed Mobility Overal bed mobility: Needs Assistance Bed Mobility: Sit to Supine       Sit to supine: Min assist;HOB elevated   General bed mobility comments: Small amount of assist for LEs onto bed.   Transfers Overall transfer level: Needs assistance Equipment used: Rolling walker (2 wheeled) Transfers: Sit to/from Stand Sit to Stand: Min assist         General transfer comment: Sit to stand x 4. VCs safety, technique, hand/LE placement.   Ambulation/Gait Ambulation/Gait assistance: Min assist Ambulation Distance (Feet): 20 Feet (x4) Assistive device: Rolling walker (2 wheeled) Gait Pattern/deviations: Step-to pattern;Decreased stride length;Decreased step length - right;Decreased step length - left     General Gait Details: assist to stabilize pt. Multiple seated rest breaks taken due to fatigue, lightheadedness. BP 102/80 after ambulation. Son  followed with recliner for safety.    Stairs            Wheelchair Mobility    Modified Rankin (Stroke Patients Only)       Balance                                            Cognition Arousal/Alertness: Awake/alert Behavior During Therapy: WFL for tasks assessed/performed Overall Cognitive Status: Within Functional Limits for tasks assessed                                        Exercises      General Comments        Pertinent Vitals/Pain Pain Assessment: 0-10 Pain Score: 7  Pain Location: L hip/thigh Pain Descriptors / Indicators: Sore;Aching;Tightness Pain Intervention(s): Limited activity within patient's tolerance;Repositioned    Home Living                      Prior Function            PT Goals (current goals can now be found in the care plan section) Progress towards PT goals: Progressing toward goals (slowly)    Frequency    7X/week      PT Plan Current plan remains appropriate    Co-evaluation              AM-PAC PT "  6 Clicks" Daily Activity  Outcome Measure  Difficulty turning over in bed (including adjusting bedclothes, sheets and blankets)?: A Little Difficulty moving from lying on back to sitting on the side of the bed? : A Little Difficulty sitting down on and standing up from a chair with arms (e.g., wheelchair, bedside commode, etc,.)?: A Little Help needed moving to and from a bed to chair (including a wheelchair)?: A Little Help needed walking in hospital room?: A Little Help needed climbing 3-5 steps with a railing? : A Little 6 Click Score: 18    End of Session Equipment Utilized During Treatment: Gait belt Activity Tolerance: Patient limited by fatigue (limited by lightheadedness) Patient left: in bed;with call bell/phone within reach;with family/visitor present   PT Visit Diagnosis: Muscle weakness (generalized) (M62.81);Difficulty in walking, not elsewhere classified  (R26.2)     Time: 2707-8675 PT Time Calculation (min) (ACUTE ONLY): 22 min  Charges:  $Gait Training: 8-22 mins                    G Codes:          Weston Anna, MPT Pager: [phone]

## 2017-04-28 NOTE — Progress Notes (Signed)
Pt plan to discharge home with [facility] at home. No DME needed at present time. Referral given to in house rep.

## 2017-04-28 NOTE — Progress Notes (Signed)
   Subjective: 1 Day Post-Op Procedure(s) (LRB): LEFT TOTAL HIP ARTHROPLASTY ANTERIOR APPROACH (Left) Patient reports pain as mild.   Patient seen in rounds with Dr. Wynelle Link. Patient is well, but has had some minor complaints of pain in the hip, requiring pain medications We will start therapy today.  Plan is to go Home after hospital stay.  Objective: Vital signs in last 24 hours: Temp:  [97 F (36.1 C)-99.9 F (37.7 C)] 98.4 F (36.9 C) (06/14 0559) Pulse Rate:  [54-69] 67 (06/14 0559) Resp:  [8-16] 15 (06/14 0559) BP: (92-141)/(41-64) 135/45 (06/14 0559) SpO2:  [94 %-100 %] 95 % (06/14 0559) Weight:  [67.6 kg (149 lb)] 67.6 kg (149 lb) (06/13 1310)  Intake/Output from previous day:  Intake/Output Summary (Last 24 hours) at 04/28/17 0837 Last data filed at 04/28/17 0610  Gross per 24 hour  Intake           4312.5 ml  Output             1945 ml  Net           2367.5 ml    Intake/Output this shift: No intake/output data recorded.  Labs:  Recent Labs  04/28/17 0419  HGB 8.4*    Recent Labs  04/28/17 0419  WBC 13.5*  RBC 2.36*  HCT 24.4*  PLT 153    Recent Labs  04/28/17 0419  NA 137  K 4.9  CL 105  CO2 23  BUN 19  CREATININE 1.32*  GLUCOSE 130*  CALCIUM 8.5*   No results for input(s): LABPT, INR in the last 72 hours.  EXAM General - Patient is Alert, Appropriate and Oriented Extremity - Neurovascular intact Sensation intact distally Intact pulses distally Dorsiflexion/Plantar flexion intact Dressing - dressing C/D/I Motor Function - intact, moving foot and toes well on exam.  Hemovac pulled without difficulty.  Past Medical History:  Diagnosis Date  . Anxiety   . Arthritis   . Crohn's disease ([employer])   . Depression   . Hypertension   . Hypothyroidism   . Osteoporosis   . Pneumonia    3-4 years ago   . Varicose veins     Assessment/Plan: 1 Day Post-Op Procedure(s) (LRB): LEFT TOTAL HIP ARTHROPLASTY ANTERIOR APPROACH (Left) Active  Problems:   OA (osteoarthritis) of hip  Estimated body mass index is 28.15 kg/m as calculated from the following:   Height as of this encounter: 5\' 1"  (1.549 m).   Weight as of this encounter: 67.6 kg (149 lb). Up with therapy Home with help from son, No formal therapy, HEP - exercise sheet willbe provided.  DVT Prophylaxis - Eliquis Weight Bearing As Tolerated left Leg Hemovac Pulled Begin Therapy  Arlee Muslim, PA-C Orthopaedic Surgery 04/28/2017, 8:37 AM

## 2017-04-29 LAB — BASIC METABOLIC PANEL
ANION GAP: 6 (ref 5–15)
BUN: 24 mg/dL — ABNORMAL HIGH (ref 6–20)
CALCIUM: 9.1 mg/dL (ref 8.9–10.3)
CHLORIDE: 107 mmol/L (ref 101–111)
CO2: 25 mmol/L (ref 22–32)
CREATININE: 1.33 mg/dL — AB (ref 0.44–1.00)
GFR calc non Af Amer: 37 mL/min — ABNORMAL LOW (ref >60)
GFR, EST AFRICAN AMERICAN: 43 mL/min — AB (ref >60)
Glucose, Bld: 143 mg/dL — ABNORMAL HIGH (ref 65–99)
Potassium: 4.6 mmol/L (ref 3.5–5.1)
SODIUM: 138 mmol/L (ref 135–145)

## 2017-04-29 LAB — CBC
HCT: 23.8 % — ABNORMAL LOW (ref 36.0–46.0)
HEMOGLOBIN: 8.2 g/dL — AB (ref 12.0–15.0)
MCH: 35.3 pg — ABNORMAL HIGH (ref 26.0–34.0)
MCHC: 34.5 g/dL (ref 30.0–36.0)
MCV: 102.6 fL — ABNORMAL HIGH (ref 78.0–100.0)
Platelets: 162 10*3/uL (ref 150–400)
RBC: 2.32 MIL/uL — ABNORMAL LOW (ref 3.87–5.11)
RDW: 12.5 % (ref 11.5–15.5)
WBC: 17.4 10*3/uL — AB (ref 4.0–10.5)

## 2017-04-29 MED ORDER — POLYSACCHARIDE IRON COMPLEX 150 MG PO CAPS: 150.0000 mg | ORAL_CAPSULE | Freq: Two times a day (BID) | ORAL | 0 refills | Status: AC

## 2017-04-29 MED ORDER — METHOCARBAMOL 500 MG PO TABS: 500.0000 mg | ORAL_TABLET | Freq: Four times a day (QID) | ORAL | 0 refills | Status: AC | PRN

## 2017-04-29 MED ORDER — APIXABAN 2.5 MG PO TABS: 2.5000 mg | ORAL_TABLET | Freq: Two times a day (BID) | ORAL | 0 refills | Status: AC

## 2017-04-29 MED ORDER — POLYSACCHARIDE IRON COMPLEX 150 MG PO CAPS
150.0000 mg | ORAL_CAPSULE | Freq: Two times a day (BID) | ORAL | Status: DC
  Administered 2017-04-29: 150 mg via ORAL
  Filled 2017-04-29: qty 1

## 2017-04-29 MED ORDER — TRAMADOL HCL 50 MG PO TABS: 50.0000 mg | ORAL_TABLET | Freq: Four times a day (QID) | ORAL | 1 refills | Status: AC | PRN

## 2017-04-29 MED ORDER — HYDROCODONE-ACETAMINOPHEN 5-325 MG PO TABS: 1.0000 | ORAL_TABLET | ORAL | 0 refills | Status: AC | PRN

## 2017-04-29 NOTE — Progress Notes (Signed)
Physical Therapy Treatment Patient Details Name: Diana Ramsey MRN: [id number] DOB: Jul 21, 1935 Today's Date: 04/29/2017    History of Present Illness 82 yo female s/p L THA-direct anterior. Hx of R THA 2016, osteoporosis, arthritis.     PT Comments    Progressing slowly with mobility. 2nd session to review/practice gait training and stair training. Son present during session to assist and observe. All education completed-made RN aware. Pt is eager to d/c home.     Follow Up Recommendations  DC plan and follow up therapy as arranged by surgeon;Home health PT     Equipment Recommendations  None recommended by PT    Recommendations for Other Services       Precautions / Restrictions Precautions Precautions: Fall Restrictions Weight Bearing Restrictions: No LLE Weight Bearing: Weight bearing as tolerated    Mobility  Bed Mobility Overal bed mobility: Needs Assistance           General bed mobility comments: oob in recliner  Transfers Overall transfer level: Needs assistance Equipment used: Rolling walker (2 wheeled) Transfers: Sit to/from Stand Sit to Stand: Min assist         General transfer comment: Assist to steady and control descent. VCS safety, technique, hand/LE placement.   Ambulation/Gait Ambulation/Gait assistance: Min assist Ambulation Distance (Feet): 45 Feet Assistive device: Rolling walker (2 wheeled) Gait Pattern/deviations: Step-to pattern     General Gait Details: Assist to stabilize intermittently. Pt denied lightheadedness. Pt continues to fatigue fairly easily. Seated rest break needed due to fatigue.    Stairs Stairs: Yes   Stair Management: Step to pattern;Backwards;With walker Number of Stairs: 2 General stair comments: VCs safety, technique, sequence. Assist to stabilze pt and walker. Son present to assist and practice with pt.   Wheelchair Mobility    Modified Rankin (Stroke Patients Only)       Balance                                             Cognition Arousal/Alertness: Awake/alert Behavior During Therapy: WFL for tasks assessed/performed Overall Cognitive Status: Within Functional Limits for tasks assessed                                        Exercises     General Comments        Pertinent Vitals/Pain Pain Assessment: 0-10 Pain Score: 7  Pain Location: L hip/thigh Pain Descriptors / Indicators: Sore;Aching;Tightness Pain Intervention(s): Monitored during session;Repositioned    Home Living                      Prior Function            PT Goals (current goals can now be found in the care plan section) Progress towards PT goals: Progressing toward goals (slowly)    Frequency    7X/week      PT Plan Current plan remains appropriate    Co-evaluation              AM-PAC PT "6 Clicks" Daily Activity  Outcome Measure  Difficulty turning over in bed (including adjusting bedclothes, sheets and blankets)?: A Lot Difficulty moving from lying on back to sitting on the side of the bed? : A Lot Difficulty sitting down on and  standing up from a chair with arms (e.g., wheelchair, bedside commode, etc,.)?: A Lot Help needed moving to and from a bed to chair (including a wheelchair)?: A Little Help needed walking in hospital room?: A Little Help needed climbing 3-5 steps with a railing? : A Little 6 Click Score: 15    End of Session Equipment Utilized During Treatment: Gait belt Activity Tolerance: Patient limited by fatigue Patient left: in chair;with call bell/phone within reach;with family/visitor present   PT Visit Diagnosis: Muscle weakness (generalized) (M62.81);Difficulty in walking, not elsewhere classified (R26.2)     Time: 4742-5956 PT Time Calculation (min) (ACUTE ONLY): 13 min  Charges:  $Gait Training: 8-22 mins                    G Codes:          Weston Anna, MPT Pager: [phone]

## 2017-04-29 NOTE — Progress Notes (Signed)
Physical Therapy Treatment Patient Details Name: Diana Ramsey MRN: [id number] DOB: 08-07-1935 Today's Date: 04/29/2017    History of Present Illness 82 yo female s/p L THA-direct anterior. Hx of R THA 2016, osteoporosis, arthritis.     PT Comments    Progressing with mobility. Improved performance on today. Pt continues to fatigue easily with activity. Will plan to have a 2nd session to ambulate once more and practice stair negotiation. Plan is for d/c later today.    Follow Up Recommendations  DC plan and follow up therapy as arranged by surgeon (HHPT)     Equipment Recommendations  None recommended by PT    Recommendations for Other Services       Precautions / Restrictions Precautions Precautions: Fall Restrictions Weight Bearing Restrictions: No LLE Weight Bearing: Weight bearing as tolerated    Mobility  Bed Mobility Overal bed mobility: Needs Assistance Bed Mobility: Supine to Sit     Supine to sit: Min assist;HOB elevated     General bed mobility comments: Small amount of assist for LEs onto bed. Increased time. Cues for technique.   Transfers Overall transfer level: Needs assistance Equipment used: Rolling walker (2 wheeled) Transfers: Sit to/from Stand Sit to Stand: Min assist         General transfer comment: Assist to rise, stabilize, control descent. VCS safety, technique, hand/LE placement.   Ambulation/Gait Ambulation/Gait assistance: Min assist Ambulation Distance (Feet): 45 Feet (x2) Assistive device: Rolling walker (2 wheeled) Gait Pattern/deviations: Step-to pattern;Decreased step length - left;Decreased stride length     General Gait Details: Assist to stabilize. Pt denied lightheadedness. Seated rest break needed due to fatigue.    Stairs            Wheelchair Mobility    Modified Rankin (Stroke Patients Only)       Balance                                            Cognition Arousal/Alertness:  Awake/alert Behavior During Therapy: WFL for tasks assessed/performed Overall Cognitive Status: Within Functional Limits for tasks assessed                                        Exercises Total Joint Exercises Ankle Circles/Pumps: AROM;Both;10 reps;Supine Quad Sets: AROM;Both;10 reps;Supine Heel Slides: AAROM;Left;10 reps;Supine Hip ABduction/ADduction: AAROM;Left;10 reps;Supine    General Comments        Pertinent Vitals/Pain Pain Assessment: 0-10 Pain Score: 6  Pain Location: L hip/thigh Pain Descriptors / Indicators: Sore;Aching;Tightness Pain Intervention(s): Monitored during session;Repositioned    Home Living                      Prior Function            PT Goals (current goals can now be found in the care plan section) Progress towards PT goals: Progressing toward goals    Frequency    7X/week      PT Plan Current plan remains appropriate    Co-evaluation              AM-PAC PT "6 Clicks" Daily Activity  Outcome Measure  Difficulty turning over in bed (including adjusting bedclothes, sheets and blankets)?: A Lot Difficulty moving from lying on back to sitting on the  side of the bed? : A Lot Difficulty sitting down on and standing up from a chair with arms (e.g., wheelchair, bedside commode, etc,.)?: A Lot Help needed moving to and from a bed to chair (including a wheelchair)?: A Little Help needed walking in hospital room?: A Little Help needed climbing 3-5 steps with a railing? : A Little 6 Click Score: 15    End of Session Equipment Utilized During Treatment: Gait belt Activity Tolerance: Patient limited by fatigue Patient left: in chair;with call bell/phone within reach;with family/visitor present   PT Visit Diagnosis: Muscle weakness (generalized) (M62.81);Difficulty in walking, not elsewhere classified (R26.2)     Time: 4562-5638 PT Time Calculation (min) (ACUTE ONLY): 20 min  Charges:  $Gait Training:  8-22 mins                    G Codes:          Weston Anna, MPT Pager: [phone]

## 2017-04-29 NOTE — Discharge Summary (Signed)
Physician Discharge Summary   Patient ID: Diana Ramsey MRN: [id number] DOB/AGE: [id number] 80 y.o.  Admit date: 04/27/2017 Discharge date: 04/29/2017  Primary Diagnosis:  Osteoarthritis of the Left hip.   Admission Diagnoses:  Past Medical History:  Diagnosis Date  . Anxiety   . Arthritis   . Crohn's disease ([place])   . Depression   . Hypertension   . Hypothyroidism   . Osteoporosis   . Pneumonia    3-4 years ago   . Varicose veins    Discharge Diagnoses:   Active Problems:   OA (osteoarthritis) of hip  Estimated body mass index is 28.15 kg/m as calculated from the following:   Height as of this encounter: _0  (1.549 m).   Weight as of this encounter: 67.6 kg (149 lb).  Procedure(s) (LRB): LEFT TOTAL HIP ARTHROPLASTY ANTERIOR APPROACH (Left)   Consults: None  HPI: Diana Ramsey is a 82 y.o. female who has advanced end-  stage arthritis of their Left  hip with progressively worsening pain and  dysfunction.The patient has failed nonoperative management and presents for  total hip arthroplasty.   Laboratory Data: Admission on 04/27/2017  Component Date Value Ref Range Status  . WBC 04/28/2017 13.5* 4.0 - 10.5 K/uL Final  . RBC 04/28/2017 2.36* 3.87 - 5.11 MIL/uL Final  . Hemoglobin 04/28/2017 8.4* 12.0 - 15.0 g/dL Final  . HCT 04/28/2017 24.4* 36.0 - 46.0 % Final  . MCV 04/28/2017 103.4* 78.0 - 100.0 fL Final  . MCH 04/28/2017 35.6* 26.0 - 34.0 pg Final  . MCHC 04/28/2017 34.4  30.0 - 36.0 g/dL Final  . RDW 04/28/2017 12.4  11.5 - 15.5 % Final  . Platelets 04/28/2017 153  150 - 400 K/uL Final  . Sodium 04/28/2017 137  135 - 145 mmol/L Final  . Potassium 04/28/2017 4.9  3.5 - 5.1 mmol/L Final  . Chloride 04/28/2017 105  101 - 111 mmol/L Final  . CO2 04/28/2017 23  22 - 32 mmol/L Final  . Glucose, Bld 04/28/2017 130* 65 - 99 mg/dL Final  . BUN 04/28/2017 19  6 - 20 mg/dL Final  . Creatinine, Ser 04/28/2017 1.32* 0.44 - 1.00 mg/dL Final  . Calcium 04/28/2017  8.5* 8.9 - 10.3 mg/dL Final  . GFR calc non Af Amer 04/28/2017 37* >60 mL/min Final  . GFR calc Af Amer 04/28/2017 43* >60 mL/min Final   Comment: (NOTE) The eGFR has been calculated using the CKD EPI equation. This calculation has not been validated in all clinical situations. eGFR's persistently <60 mL/min signify possible Chronic Kidney Disease.   . Anion gap 04/28/2017 9  5 - 15 Final  . WBC 04/29/2017 17.4* 4.0 - 10.5 K/uL Final  . RBC 04/29/2017 2.32* 3.87 - 5.11 MIL/uL Final  . Hemoglobin 04/29/2017 8.2* 12.0 - 15.0 g/dL Final  . HCT 04/29/2017 23.8* 36.0 - 46.0 % Final  . MCV 04/29/2017 102.6* 78.0 - 100.0 fL Final  . MCH 04/29/2017 35.3* 26.0 - 34.0 pg Final  . MCHC 04/29/2017 34.5  30.0 - 36.0 g/dL Final  . RDW 04/29/2017 12.5  11.5 - 15.5 % Final  . Platelets 04/29/2017 162  150 - 400 K/uL Final  . Sodium 04/29/2017 138  135 - 145 mmol/L Final  . Potassium 04/29/2017 4.6  3.5 - 5.1 mmol/L Final  . Chloride 04/29/2017 107  101 - 111 mmol/L Final  . CO2 04/29/2017 25  22 - 32 mmol/L Final  . Glucose, Bld 04/29/2017 143* 65 -  99 mg/dL Final  . BUN 04/29/2017 24* 6 - 20 mg/dL Final  . Creatinine, Ser 04/29/2017 1.33* 0.44 - 1.00 mg/dL Final  . Calcium 04/29/2017 9.1  8.9 - 10.3 mg/dL Final  . GFR calc non Af Amer 04/29/2017 37* >60 mL/min Final  . GFR calc Af Amer 04/29/2017 43* >60 mL/min Final   Comment: (NOTE) The eGFR has been calculated using the CKD EPI equation. This calculation has not been validated in all clinical situations. eGFR's persistently <60 mL/min signify possible Chronic Kidney Disease.   Georgiann Hahn gap 04/29/2017 6  5 - 15 Final  Hospital Outpatient Visit on 04/22/2017  Component Date Value Ref Range Status  . aPTT 04/22/2017 30  24 - 36 seconds Final  . WBC 04/22/2017 9.6  4.0 - 10.5 K/uL Final  . RBC 04/22/2017 3.76* 3.87 - 5.11 MIL/uL Final  . Hemoglobin 04/22/2017 12.8  12.0 - 15.0 g/dL Final  . HCT 04/22/2017 39.4  36.0 - 46.0 % Final  . MCV  04/22/2017 104.8* 78.0 - 100.0 fL Final  . MCH 04/22/2017 34.0  26.0 - 34.0 pg Final  . MCHC 04/22/2017 32.5  30.0 - 36.0 g/dL Final  . RDW 04/22/2017 12.2  11.5 - 15.5 % Final  . Platelets 04/22/2017 216  150 - 400 K/uL Final  . Sodium 04/22/2017 140  135 - 145 mmol/L Final  . Potassium 04/22/2017 4.9  3.5 - 5.1 mmol/L Final  . Chloride 04/22/2017 106  101 - 111 mmol/L Final  . CO2 04/22/2017 28  22 - 32 mmol/L Final  . Glucose, Bld 04/22/2017 110* 65 - 99 mg/dL Final  . BUN 04/22/2017 22* 6 - 20 mg/dL Final  . Creatinine, Ser 04/22/2017 1.56* 0.44 - 1.00 mg/dL Final  . Calcium 04/22/2017 9.7  8.9 - 10.3 mg/dL Final  . Total Protein 04/22/2017 6.9  6.5 - 8.1 g/dL Final  . Albumin 04/22/2017 4.0  3.5 - 5.0 g/dL Final  . AST 04/22/2017 24  15 - 41 U/L Final  . ALT 04/22/2017 18  14 - 54 U/L Final  . Alkaline Phosphatase 04/22/2017 74  38 - 126 U/L Final  . Total Bilirubin 04/22/2017 0.5  0.3 - 1.2 mg/dL Final  . GFR calc non Af Amer 04/22/2017 30* >60 mL/min Final  . GFR calc Af Amer 04/22/2017 35* >60 mL/min Final   Comment: (NOTE) The eGFR has been calculated using the CKD EPI equation. This calculation has not been validated in all clinical situations. eGFR's persistently <60 mL/min signify possible Chronic Kidney Disease.   . Anion gap 04/22/2017 6  5 - 15 Final  . Prothrombin Time 04/22/2017 12.8  11.4 - 15.2 seconds Final  . INR 04/22/2017 0.96   Final  . ABO/RH(D) 04/22/2017 O POS   Final  . Antibody Screen 04/22/2017 NEG   Final  . Sample Expiration 04/22/2017 04/30/2017   Final  . Extend sample reason 04/22/2017 NO TRANSFUSIONS OR PREGNANCY IN THE PAST 3 MONTHS   Final  . MRSA, PCR 04/22/2017 NEGATIVE  NEGATIVE Final  . Staphylococcus aureus 04/22/2017 NEGATIVE  NEGATIVE Final   Comment:        The Xpert SA Assay (FDA approved for NASAL specimens in patients over 52 years of age), is one component of a comprehensive surveillance program.  Test performance has been  validated by [facility] for patients greater than or equal to 71 year old. It is not intended to diagnose infection nor to guide or monitor treatment.  X-Rays:Dg Pelvis Portable  Result Date: 04/27/2017 CLINICAL DATA:  Left hip arthroplasty. EXAM: PORTABLE PELVIS 1-2 VIEWS COMPARISON:  02/19/2015 FINDINGS: Interval left hip arthroplasty, without acute hardware complication or periprosthetic fracture. Right hip arthroplasty is unchanged in appearance. IMPRESSION: Expected appearance after interval left hip arthroplasty. Electronically Signed   By: Abigail Miyamoto M.D.   On: 04/27/2017 11:52   Dg C-arm 1-60 Min-no Report  Result Date: 04/27/2017 Fluoroscopy was utilized by the requesting physician.  No radiographic interpretation.    EKG:No orders found for this or any previous visit.   Hospital Course: Patient was admitted to [facility] and taken to the OR and underwent the above state procedure without complications.  Patient tolerated the procedure well and was later transferred to the recovery room and then to the orthopaedic floor for postoperative care.  They were given PO and IV analgesics for pain control following their surgery.  They were given 24 hours of postoperative antibiotics of  Anti-infectives    Start     Dose/Rate Route Frequency Ordered Stop   04/27/17 1600  ceFAZolin (ANCEF) IVPB 2g/100 mL premix     2 g 200 mL/hr over 30 Minutes Intravenous Every 6 hours 04/27/17 1315 04/27/17 2221   04/27/17 0713  ceFAZolin (ANCEF) 2-4 GM/100ML-% IVPB    Comments:  [place]ald[place] Session   : cabinet override      04/27/17 0713 04/27/17 0943   04/27/17 0710  ceFAZolin (ANCEF) IVPB 2g/100 mL premix     2 g 200 mL/hr over 30 Minutes Intravenous On call to O.R. 04/27/17 0710 04/27/17 1013     and started on DVT prophylaxis in the form of Eliquis.   PT and OT were ordered for total hip protocol.  The patient was allowed to be WBAT with therapy. Discharge planning was consulted to  help with postop disposition and equipment needs.  Patient had a decent night on the evening of surgery.  They started to get up OOB with therapy on day one and walked 20 feet.  Hemovac drain was pulled without difficulty.  Continued to work with therapy into day two.  She was doing okay but progressing slowly.  She was originally going to go home and just do her own HEP since she was familiar with program. She was lightheaded and felt fatigued when up walking the day before.  She was able to get up with therapy without symptoms and walked 45 feet twice.  Dressing was changed on day two and the incision was healing well.  Patient was seen in rounds and was ready to go home later that afternoon.  Discharge home with home health Diet - Cardiac diet Follow up - in 2 weeks Activity - WBAT Disposition - Home Condition Upon Discharge - stable D/C Meds - See DC Summary DVT Prophylaxis - Eliquis   Discharge Instructions    Call MD / Call 911    Complete by:  As directed    If you experience chest pain or shortness of breath, CALL 911 and be transported to the hospital emergency room.  If you develope a fever above 101 F, pus (white drainage) or increased drainage or redness at the wound, or calf pain, call your surgeon's office.   Change dressing    Complete by:  As directed    You may change your dressing dressing daily with sterile 4 x 4 inch gauze dressing and paper tape.  Do not submerge the incision under water.   Constipation Prevention  Complete by:  As directed    Drink plenty of fluids.  Prune juice may be helpful.  You may use a stool softener, such as Colace (over the counter) 100 mg twice a day.  Use MiraLax (over the counter) for constipation as needed.   Diet general    Complete by:  As directed    Discharge instructions    Complete by:  As directed    Take Eliquis twice a day for a total of three weeks.  After three weeks, discontinue the Eliquis and then reduce back to a baby 81  mg Aspirin daily at home.  Pick up stool softner and laxative for home use following surgery while on pain medications. Do not submerge incision under water. Please use good hand washing techniques while changing dressing each day. May shower starting three days after surgery. Please use a clean towel to pat the incision dry following showers. Continue to use ice for pain and swelling after surgery. Do not use any lotions or creams on the incision until instructed by your surgeon.  Wear both TED hose on both legs during the day every day for three weeks, but may remove the TED hose at night at home.  Postoperative Constipation Protocol  Constipation - defined medically as fewer than three stools per week and severe constipation as less than one stool per week.  One of the most common issues patients have following surgery is constipation.  Even if you have a regular bowel pattern at home, your normal regimen is likely to be disrupted due to multiple reasons following surgery.  Combination of anesthesia, postoperative narcotics, change in appetite and fluid intake all can affect your bowels.  In order to avoid complications following surgery, here are some recommendations in order to help you during your recovery period.  Colace (docusate) - Pick up an over-the-counter form of Colace or another stool softener and take twice a day as long as you are requiring postoperative pain medications.  Take with a full glass of water daily.  If you experience loose stools or diarrhea, hold the colace until you stool forms back up.  If your symptoms do not get better within 1 week or if they get worse, check with your doctor.  Dulcolax (bisacodyl) - Pick up over-the-counter and take as directed by the product packaging as needed to assist with the movement of your bowels.  Take with a full glass of water.  Use this product as needed if not relieved by Colace only.   MiraLax (polyethylene glycol) - Pick up  over-the-counter to have on hand.  MiraLax is a solution that will increase the amount of water in your bowels to assist with bowel movements.  Take as directed and can mix with a glass of water, juice, soda, coffee, or tea.  Take if you go more than two days without a movement. Do not use MiraLax more than once per day. Call your doctor if you are still constipated or irregular after using this medication for 7 days in a row.  If you continue to have problems with postoperative constipation, please contact the office for further assistance and recommendations.  If you experience "the worst abdominal pain ever" or develop nausea or vomiting, please contact the office immediatly for further recommendations for treatment.   Do not sit on low chairs, stoools or toilet seats, as it may be difficult to get up from low surfaces    Complete by:  As directed  Driving restrictions    Complete by:  As directed    No driving until released by the physician.   Increase activity slowly as tolerated    Complete by:  As directed    Lifting restrictions    Complete by:  As directed    No lifting until released by the physician.   Patient may shower    Complete by:  As directed    You may shower without a dressing once there is no drainage.  Do not wash over the wound.  If drainage remains, do not shower until drainage stops.   TED hose    Complete by:  As directed    Use stockings (TED hose) for 3 weeks on both leg(s).  You may remove them at night for sleeping.   Weight bearing as tolerated    Complete by:  As directed    Laterality:  left   Extremity:  Lower     Allergies as of 04/29/2017      Reactions   Chocolate Diarrhea   Morphine And Related Nausea Only   Latex Itching, Rash   Penicillins Itching, Rash   Has patient had a PCN reaction causing immediate rash, facial/tongue/throat swelling, SOB or lightheadedness with hypotension: Yes Has patient had a PCN reaction causing severe rash  involving mucus membranes or skin necrosis: Yes Has patient had a PCN reaction that required hospitalization: No Has patient had a PCN reaction occurring within the last 10 years: No If all of the above answers are "NO", then may proceed with Cephalosporin use.      Medication List    STOP taking these medications   aspirin EC 81 MG tablet   B-12 COMPLIANCE INJECTION 1000 MCG/ML Kit Generic drug:  Cyanocobalamin   diclofenac sodium 1 % Gel Commonly known as:  VOLTAREN   gabapentin 300 MG capsule Commonly known as:  NEURONTIN   ibandronate 150 MG tablet Commonly known as:  BONIVA   methylPREDNISolone 4 MG Tbpk tablet Commonly known as:  MEDROL DOSEPAK     TAKE these medications   acetaminophen 650 MG CR tablet Commonly known as:  TYLENOL Take 650 mg by mouth daily.   ALPRAZolam 0.5 MG tablet Commonly known as:  XANAX Take 0.5 mg by mouth 2 (two) times daily.   apixaban 2.5 MG Tabs tablet Commonly known as:  ELIQUIS Take 1 tablet (2.5 mg total) by mouth every 12 (twelve) hours. Take Eliquis twice a day for a total of three weeks.  After three weeks, discontinue the Eliquis and then reduce back to a baby 81 mg Aspirin daily at home.   budesonide 3 MG 24 hr capsule Commonly known as:  ENTOCORT EC Take 3 mg by mouth daily.   carboxymethylcellulose 0.5 % Soln Commonly known as:  REFRESH PLUS Place 1 drop into both eyes daily as needed (dry eyes).   citalopram 10 MG tablet Commonly known as:  CELEXA Take 10 mg by mouth daily.   HYDROcodone-acetaminophen 5-325 MG tablet Commonly known as:  NORCO/VICODIN Take 1-2 tablets by mouth every 4 (four) hours as needed for moderate pain.   iron polysaccharides 150 MG capsule Commonly known as:  NIFEREX Take 1 capsule (150 mg total) by mouth 2 (two) times daily.   levothyroxine 50 MCG tablet Commonly known as:  SYNTHROID, LEVOTHROID Take 50 mcg by mouth daily before breakfast.   loperamide 2 MG tablet Commonly known as:   IMODIUM A-D Take 2 mg by mouth 4 (four) times daily  as needed for diarrhea or loose stools.   methocarbamol 500 MG tablet Commonly known as:  ROBAXIN Take 1 tablet (500 mg total) by mouth every 6 (six) hours as needed for muscle spasms.   metoprolol tartrate 50 MG tablet Commonly known as:  LOPRESSOR Take 50 mg by mouth 2 (two) times daily.   mirtazapine 30 MG tablet Commonly known as:  REMERON Take 30 mg by mouth at bedtime.   pravastatin 80 MG tablet Commonly known as:  PRAVACHOL Take 80 mg by mouth every evening.   traMADol 50 MG tablet Commonly known as:  ULTRAM Take 1-2 tablets (50-100 mg total) by mouth every 6 (six) hours as needed (mild to moderate pain). What changed:  when to take this      Follow-up Information    Gaynelle Arabian, MD. Schedule an appointment as soon as possible for a visit on 05/10/2017.   Specialty:  Orthopedic Surgery Contact information: [address] [place] [address] [phone]           Signed: Arlee Muslim, PA-C Orthopaedic Surgery 04/29/2017, 7:59 AM

## 2017-04-29 NOTE — Progress Notes (Signed)
   Subjective: 2 Days Post-Op Procedure(s) (LRB): LEFT TOTAL HIP ARTHROPLASTY ANTERIOR APPROACH (Left) Patient reports pain as mild.   Patient seen in rounds with Dr. Wynelle Link.  She is doing okay but progressing slowly with therapy.  She was originally going to go home and just do her own HEP since she was familiar with program.  She was lightheaded and felt fatigued when up walking. Will order HHPT to work with her. See how she does today.  If she is able to get up with therapy without symptoms, then home after morning therapy.  If any symptoms, please notify and will give a unit of blood, then get therapy again this afternoon and home this PM after therapy. Patient is well, but has had some minor complaints of pain in the hip, requiring pain medications Patient will be setup to go home today, timing will depend upon progress and symptoms.  Objective: Vital signs in last 24 hours: Temp:  [97.9 F (36.6 C)-98.6 F (37 C)] 97.9 F (36.6 C) (06/15 0634) Pulse Rate:  [79-89] 80 (06/15 0634) Resp:  [15-16] 16 (06/15 0634) BP: (102-127)/(38-80) 107/76 (06/15 0634) SpO2:  [96 %-98 %] 98 % (06/15 0634)  Intake/Output from previous day:  Intake/Output Summary (Last 24 hours) at 04/29/17 0745 Last data filed at 04/29/17 0600  Gross per 24 hour  Intake          1163.67 ml  Output              725 ml  Net           438.67 ml    Intake/Output this shift: No intake/output data recorded.  Labs:  Recent Labs  04/28/17 0419 04/29/17 0418  HGB 8.4* 8.2*    Recent Labs  04/28/17 0419 04/29/17 0418  WBC 13.5* 17.4*  RBC 2.36* 2.32*  HCT 24.4* 23.8*  PLT 153 162    Recent Labs  04/28/17 0419 04/29/17 0418  NA 137 138  K 4.9 4.6  CL 105 107  CO2 23 25  BUN 19 24*  CREATININE 1.32* 1.33*  GLUCOSE 130* 143*  CALCIUM 8.5* 9.1   No results for input(s): LABPT, INR in the last 72 hours.  EXAM: General - Patient is Alert, Appropriate and Oriented Extremity - Neurovascular  intact Sensation intact distally Intact pulses distally Dorsiflexion/Plantar flexion intact Incision - clean, dry, no drainage Motor Function - intact, moving foot and toes well on exam.   Assessment/Plan: 2 Days Post-Op Procedure(s) (LRB): LEFT TOTAL HIP ARTHROPLASTY ANTERIOR APPROACH (Left) Procedure(s) (LRB): LEFT TOTAL HIP ARTHROPLASTY ANTERIOR APPROACH (Left) Past Medical History:  Diagnosis Date  . Anxiety   . Arthritis   . Crohn's disease ([place])   . Depression   . Hypertension   . Hypothyroidism   . Osteoporosis   . Pneumonia    3-4 years ago   . Varicose veins    Active Problems:   OA (osteoarthritis) of hip  Estimated body mass index is 28.15 kg/m as calculated from the following:   Height as of this encounter: 5\' 1"  (1.549 m).   Weight as of this encounter: 67.6 kg (149 lb). Up with therapy Discharge home with home health Diet - Cardiac diet Follow up - in 2 weeks Activity - WBAT Disposition - Home Condition Upon Discharge - pending therapy D/C Meds - See DC Summary DVT Prophylaxis - Eliquis  Arlee Muslim, PA-C Orthopaedic Surgery 04/29/2017, 7:45 AM

## 2017-05-01 DIAGNOSIS — F329 Major depressive disorder, single episode, unspecified: Secondary | ICD-10-CM | POA: Diagnosis not present

## 2017-05-01 DIAGNOSIS — I10 Essential (primary) hypertension: Secondary | ICD-10-CM | POA: Diagnosis not present

## 2017-05-01 DIAGNOSIS — M810 Age-related osteoporosis without current pathological fracture: Secondary | ICD-10-CM | POA: Diagnosis not present

## 2017-05-01 DIAGNOSIS — F419 Anxiety disorder, unspecified: Secondary | ICD-10-CM | POA: Diagnosis not present

## 2017-05-01 DIAGNOSIS — Z471 Aftercare following joint replacement surgery: Secondary | ICD-10-CM | POA: Diagnosis not present

## 2017-05-01 DIAGNOSIS — G629 Polyneuropathy, unspecified: Secondary | ICD-10-CM | POA: Diagnosis not present

## 2017-05-03 DIAGNOSIS — I10 Essential (primary) hypertension: Secondary | ICD-10-CM | POA: Diagnosis not present

## 2017-05-03 DIAGNOSIS — M810 Age-related osteoporosis without current pathological fracture: Secondary | ICD-10-CM | POA: Diagnosis not present

## 2017-05-03 DIAGNOSIS — Z471 Aftercare following joint replacement surgery: Secondary | ICD-10-CM | POA: Diagnosis not present

## 2017-05-03 DIAGNOSIS — G629 Polyneuropathy, unspecified: Secondary | ICD-10-CM | POA: Diagnosis not present

## 2017-05-03 DIAGNOSIS — F419 Anxiety disorder, unspecified: Secondary | ICD-10-CM | POA: Diagnosis not present

## 2017-05-03 DIAGNOSIS — F329 Major depressive disorder, single episode, unspecified: Secondary | ICD-10-CM | POA: Diagnosis not present

## 2017-05-05 DIAGNOSIS — F419 Anxiety disorder, unspecified: Secondary | ICD-10-CM | POA: Diagnosis not present

## 2017-05-05 DIAGNOSIS — G629 Polyneuropathy, unspecified: Secondary | ICD-10-CM | POA: Diagnosis not present

## 2017-05-05 DIAGNOSIS — Z471 Aftercare following joint replacement surgery: Secondary | ICD-10-CM | POA: Diagnosis not present

## 2017-05-05 DIAGNOSIS — F329 Major depressive disorder, single episode, unspecified: Secondary | ICD-10-CM | POA: Diagnosis not present

## 2017-05-05 DIAGNOSIS — M810 Age-related osteoporosis without current pathological fracture: Secondary | ICD-10-CM | POA: Diagnosis not present

## 2017-05-05 DIAGNOSIS — I10 Essential (primary) hypertension: Secondary | ICD-10-CM | POA: Diagnosis not present

## 2017-05-09 DIAGNOSIS — F329 Major depressive disorder, single episode, unspecified: Secondary | ICD-10-CM | POA: Diagnosis not present

## 2017-05-09 DIAGNOSIS — I10 Essential (primary) hypertension: Secondary | ICD-10-CM | POA: Diagnosis not present

## 2017-05-09 DIAGNOSIS — G629 Polyneuropathy, unspecified: Secondary | ICD-10-CM | POA: Diagnosis not present

## 2017-05-09 DIAGNOSIS — M810 Age-related osteoporosis without current pathological fracture: Secondary | ICD-10-CM | POA: Diagnosis not present

## 2017-05-09 DIAGNOSIS — F419 Anxiety disorder, unspecified: Secondary | ICD-10-CM | POA: Diagnosis not present

## 2017-05-09 DIAGNOSIS — Z471 Aftercare following joint replacement surgery: Secondary | ICD-10-CM | POA: Diagnosis not present

## 2017-05-10 DIAGNOSIS — M1612 Unilateral primary osteoarthritis, left hip: Secondary | ICD-10-CM | POA: Diagnosis not present

## 2017-05-12 DIAGNOSIS — F329 Major depressive disorder, single episode, unspecified: Secondary | ICD-10-CM | POA: Diagnosis not present

## 2017-05-12 DIAGNOSIS — Z471 Aftercare following joint replacement surgery: Secondary | ICD-10-CM | POA: Diagnosis not present

## 2017-05-12 DIAGNOSIS — G629 Polyneuropathy, unspecified: Secondary | ICD-10-CM | POA: Diagnosis not present

## 2017-05-12 DIAGNOSIS — F419 Anxiety disorder, unspecified: Secondary | ICD-10-CM | POA: Diagnosis not present

## 2017-05-12 DIAGNOSIS — I10 Essential (primary) hypertension: Secondary | ICD-10-CM | POA: Diagnosis not present

## 2017-05-12 DIAGNOSIS — M810 Age-related osteoporosis without current pathological fracture: Secondary | ICD-10-CM | POA: Diagnosis not present

## 2017-05-16 DIAGNOSIS — F329 Major depressive disorder, single episode, unspecified: Secondary | ICD-10-CM | POA: Diagnosis not present

## 2017-05-16 DIAGNOSIS — G629 Polyneuropathy, unspecified: Secondary | ICD-10-CM | POA: Diagnosis not present

## 2017-05-16 DIAGNOSIS — F419 Anxiety disorder, unspecified: Secondary | ICD-10-CM | POA: Diagnosis not present

## 2017-05-16 DIAGNOSIS — Z471 Aftercare following joint replacement surgery: Secondary | ICD-10-CM | POA: Diagnosis not present

## 2017-05-16 DIAGNOSIS — I10 Essential (primary) hypertension: Secondary | ICD-10-CM | POA: Diagnosis not present

## 2017-05-16 DIAGNOSIS — M810 Age-related osteoporosis without current pathological fracture: Secondary | ICD-10-CM | POA: Diagnosis not present

## 2017-05-19 DIAGNOSIS — F419 Anxiety disorder, unspecified: Secondary | ICD-10-CM | POA: Diagnosis not present

## 2017-05-19 DIAGNOSIS — Z471 Aftercare following joint replacement surgery: Secondary | ICD-10-CM | POA: Diagnosis not present

## 2017-05-19 DIAGNOSIS — F329 Major depressive disorder, single episode, unspecified: Secondary | ICD-10-CM | POA: Diagnosis not present

## 2017-05-19 DIAGNOSIS — G629 Polyneuropathy, unspecified: Secondary | ICD-10-CM | POA: Diagnosis not present

## 2017-05-19 DIAGNOSIS — I10 Essential (primary) hypertension: Secondary | ICD-10-CM | POA: Diagnosis not present

## 2017-05-19 DIAGNOSIS — M810 Age-related osteoporosis without current pathological fracture: Secondary | ICD-10-CM | POA: Diagnosis not present

## 2017-05-20 DIAGNOSIS — G629 Polyneuropathy, unspecified: Secondary | ICD-10-CM | POA: Diagnosis not present

## 2017-05-20 DIAGNOSIS — Z471 Aftercare following joint replacement surgery: Secondary | ICD-10-CM | POA: Diagnosis not present

## 2017-05-20 DIAGNOSIS — M810 Age-related osteoporosis without current pathological fracture: Secondary | ICD-10-CM | POA: Diagnosis not present

## 2017-05-20 DIAGNOSIS — F329 Major depressive disorder, single episode, unspecified: Secondary | ICD-10-CM | POA: Diagnosis not present

## 2017-05-20 DIAGNOSIS — I10 Essential (primary) hypertension: Secondary | ICD-10-CM | POA: Diagnosis not present

## 2017-05-20 DIAGNOSIS — F419 Anxiety disorder, unspecified: Secondary | ICD-10-CM | POA: Diagnosis not present

## 2017-05-23 DIAGNOSIS — M810 Age-related osteoporosis without current pathological fracture: Secondary | ICD-10-CM | POA: Diagnosis not present

## 2017-05-23 DIAGNOSIS — F419 Anxiety disorder, unspecified: Secondary | ICD-10-CM | POA: Diagnosis not present

## 2017-05-23 DIAGNOSIS — I10 Essential (primary) hypertension: Secondary | ICD-10-CM | POA: Diagnosis not present

## 2017-05-23 DIAGNOSIS — F329 Major depressive disorder, single episode, unspecified: Secondary | ICD-10-CM | POA: Diagnosis not present

## 2017-05-23 DIAGNOSIS — G629 Polyneuropathy, unspecified: Secondary | ICD-10-CM | POA: Diagnosis not present

## 2017-05-23 DIAGNOSIS — Z471 Aftercare following joint replacement surgery: Secondary | ICD-10-CM | POA: Diagnosis not present

## 2017-05-25 DIAGNOSIS — F419 Anxiety disorder, unspecified: Secondary | ICD-10-CM | POA: Diagnosis not present

## 2017-05-25 DIAGNOSIS — F329 Major depressive disorder, single episode, unspecified: Secondary | ICD-10-CM | POA: Diagnosis not present

## 2017-05-25 DIAGNOSIS — I10 Essential (primary) hypertension: Secondary | ICD-10-CM | POA: Diagnosis not present

## 2017-05-25 DIAGNOSIS — G629 Polyneuropathy, unspecified: Secondary | ICD-10-CM | POA: Diagnosis not present

## 2017-05-25 DIAGNOSIS — Z471 Aftercare following joint replacement surgery: Secondary | ICD-10-CM | POA: Diagnosis not present

## 2017-05-25 DIAGNOSIS — M810 Age-related osteoporosis without current pathological fracture: Secondary | ICD-10-CM | POA: Diagnosis not present

## 2017-05-27 DIAGNOSIS — I10 Essential (primary) hypertension: Secondary | ICD-10-CM | POA: Diagnosis not present

## 2017-05-27 DIAGNOSIS — F329 Major depressive disorder, single episode, unspecified: Secondary | ICD-10-CM | POA: Diagnosis not present

## 2017-05-27 DIAGNOSIS — F419 Anxiety disorder, unspecified: Secondary | ICD-10-CM | POA: Diagnosis not present

## 2017-05-27 DIAGNOSIS — M810 Age-related osteoporosis without current pathological fracture: Secondary | ICD-10-CM | POA: Diagnosis not present

## 2017-05-27 DIAGNOSIS — Z471 Aftercare following joint replacement surgery: Secondary | ICD-10-CM | POA: Diagnosis not present

## 2017-05-27 DIAGNOSIS — G629 Polyneuropathy, unspecified: Secondary | ICD-10-CM | POA: Diagnosis not present

## 2017-05-31 DIAGNOSIS — M1612 Unilateral primary osteoarthritis, left hip: Secondary | ICD-10-CM | POA: Diagnosis not present

## 2017-06-07 DIAGNOSIS — K52831 Collagenous colitis: Secondary | ICD-10-CM | POA: Diagnosis not present

## 2017-06-10 DIAGNOSIS — D510 Vitamin B12 deficiency anemia due to intrinsic factor deficiency: Secondary | ICD-10-CM | POA: Diagnosis not present

## 2017-06-20 DIAGNOSIS — E039 Hypothyroidism, unspecified: Secondary | ICD-10-CM | POA: Diagnosis not present

## 2017-06-20 DIAGNOSIS — Z0000 Encounter for general adult medical examination without abnormal findings: Secondary | ICD-10-CM | POA: Diagnosis not present

## 2017-06-20 DIAGNOSIS — Z79899 Other long term (current) drug therapy: Secondary | ICD-10-CM | POA: Diagnosis not present

## 2017-06-20 DIAGNOSIS — F418 Other specified anxiety disorders: Secondary | ICD-10-CM | POA: Diagnosis not present

## 2017-06-20 DIAGNOSIS — I10 Essential (primary) hypertension: Secondary | ICD-10-CM | POA: Diagnosis not present

## 2017-06-20 DIAGNOSIS — E785 Hyperlipidemia, unspecified: Secondary | ICD-10-CM | POA: Diagnosis not present

## 2017-06-22 DIAGNOSIS — D649 Anemia, unspecified: Secondary | ICD-10-CM | POA: Diagnosis not present

## 2017-06-23 DIAGNOSIS — Z1212 Encounter for screening for malignant neoplasm of rectum: Secondary | ICD-10-CM | POA: Diagnosis not present

## 2017-06-23 DIAGNOSIS — D649 Anemia, unspecified: Secondary | ICD-10-CM | POA: Diagnosis not present

## 2017-06-23 DIAGNOSIS — K52831 Collagenous colitis: Secondary | ICD-10-CM | POA: Diagnosis not present

## 2017-06-23 DIAGNOSIS — R195 Other fecal abnormalities: Secondary | ICD-10-CM | POA: Diagnosis not present

## 2017-06-29 DIAGNOSIS — D649 Anemia, unspecified: Secondary | ICD-10-CM | POA: Diagnosis not present

## 2017-06-29 DIAGNOSIS — R195 Other fecal abnormalities: Secondary | ICD-10-CM | POA: Diagnosis not present

## 2017-06-29 DIAGNOSIS — K2951 Unspecified chronic gastritis with bleeding: Secondary | ICD-10-CM | POA: Diagnosis not present

## 2017-06-29 DIAGNOSIS — D500 Iron deficiency anemia secondary to blood loss (chronic): Secondary | ICD-10-CM | POA: Diagnosis not present

## 2017-06-29 DIAGNOSIS — K52831 Collagenous colitis: Secondary | ICD-10-CM | POA: Diagnosis not present

## 2017-06-29 DIAGNOSIS — K5289 Other specified noninfective gastroenteritis and colitis: Secondary | ICD-10-CM | POA: Diagnosis not present

## 2017-06-29 DIAGNOSIS — K2901 Acute gastritis with bleeding: Secondary | ICD-10-CM | POA: Diagnosis not present

## 2017-07-12 DIAGNOSIS — M1612 Unilateral primary osteoarthritis, left hip: Secondary | ICD-10-CM | POA: Diagnosis not present

## 2017-07-13 DIAGNOSIS — D510 Vitamin B12 deficiency anemia due to intrinsic factor deficiency: Secondary | ICD-10-CM | POA: Diagnosis not present

## 2017-07-20 DIAGNOSIS — Z1231 Encounter for screening mammogram for malignant neoplasm of breast: Secondary | ICD-10-CM | POA: Diagnosis not present

## 2017-08-09 DIAGNOSIS — D500 Iron deficiency anemia secondary to blood loss (chronic): Secondary | ICD-10-CM | POA: Diagnosis not present

## 2017-08-11 DIAGNOSIS — R195 Other fecal abnormalities: Secondary | ICD-10-CM | POA: Diagnosis not present

## 2017-08-11 DIAGNOSIS — D500 Iron deficiency anemia secondary to blood loss (chronic): Secondary | ICD-10-CM | POA: Diagnosis not present

## 2017-08-11 DIAGNOSIS — K2901 Acute gastritis with bleeding: Secondary | ICD-10-CM | POA: Diagnosis not present

## 2017-08-11 DIAGNOSIS — K52831 Collagenous colitis: Secondary | ICD-10-CM | POA: Diagnosis not present

## 2017-08-16 DIAGNOSIS — D510 Vitamin B12 deficiency anemia due to intrinsic factor deficiency: Secondary | ICD-10-CM | POA: Diagnosis not present

## 2017-08-18 DIAGNOSIS — Z6827 Body mass index (BMI) 27.0-27.9, adult: Secondary | ICD-10-CM | POA: Diagnosis not present

## 2017-08-18 DIAGNOSIS — Z23 Encounter for immunization: Secondary | ICD-10-CM | POA: Diagnosis not present

## 2017-08-18 DIAGNOSIS — E860 Dehydration: Secondary | ICD-10-CM | POA: Diagnosis not present

## 2017-08-25 DIAGNOSIS — D500 Iron deficiency anemia secondary to blood loss (chronic): Secondary | ICD-10-CM | POA: Diagnosis not present

## 2017-09-01 DIAGNOSIS — E860 Dehydration: Secondary | ICD-10-CM | POA: Diagnosis not present

## 2017-09-01 DIAGNOSIS — Z6827 Body mass index (BMI) 27.0-27.9, adult: Secondary | ICD-10-CM | POA: Diagnosis not present

## 2017-09-19 DIAGNOSIS — D510 Vitamin B12 deficiency anemia due to intrinsic factor deficiency: Secondary | ICD-10-CM | POA: Diagnosis not present

## 2017-11-01 DIAGNOSIS — D510 Vitamin B12 deficiency anemia due to intrinsic factor deficiency: Secondary | ICD-10-CM | POA: Diagnosis not present

## 2017-11-14 IMAGING — DX DG PORTABLE PELVIS
1 series · 1 of 1 positions shown · non-contrast
Comparison: 02/19/2015

CLINICAL DATA: Left hip arthroplasty.

EXAM:
PORTABLE PELVIS 1-2 VIEWS

[pelvis ap]
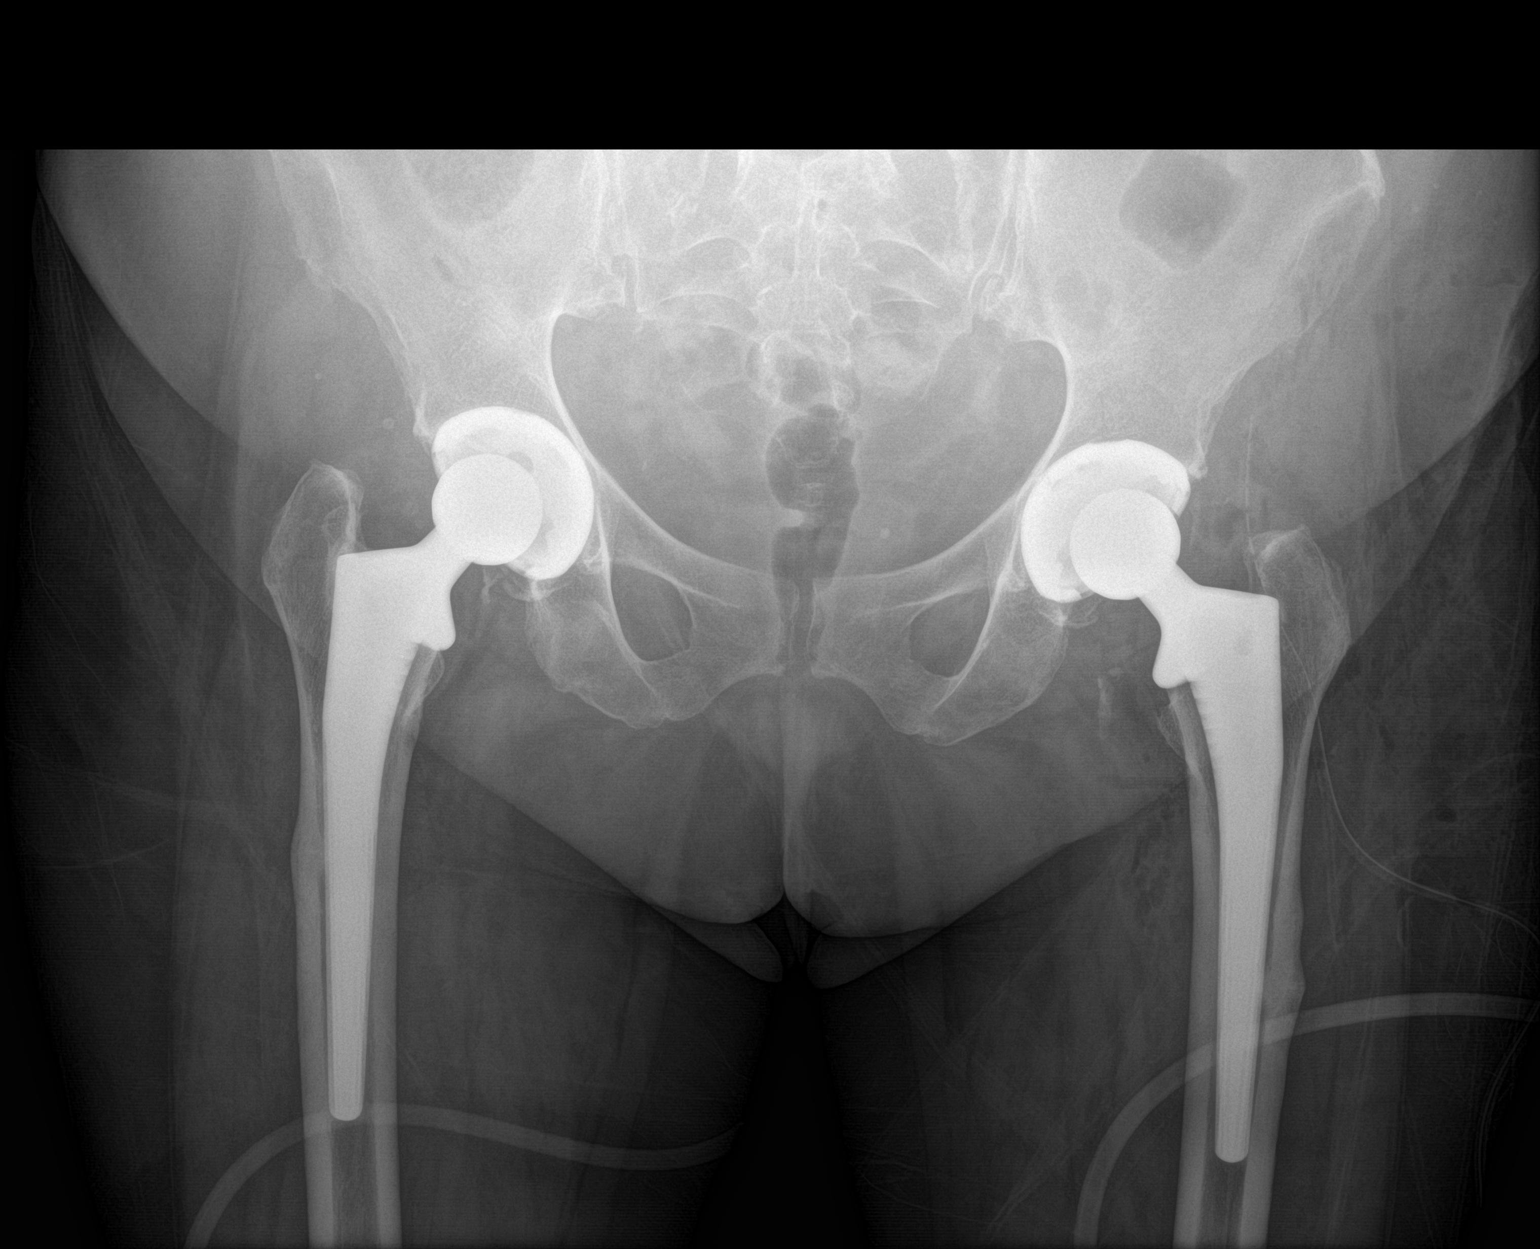

[1 of 1 positions shown; findings below may reference images not displayed]

FINDINGS: Interval left hip arthroplasty, without acute hardware complication
or periprosthetic fracture. Right hip arthroplasty is unchanged in
appearance.
IMPRESSION: Expected appearance after interval left hip arthroplasty.

## 2017-12-02 DIAGNOSIS — D500 Iron deficiency anemia secondary to blood loss (chronic): Secondary | ICD-10-CM | POA: Diagnosis not present

## 2017-12-02 DIAGNOSIS — D509 Iron deficiency anemia, unspecified: Secondary | ICD-10-CM | POA: Diagnosis not present

## 2017-12-06 DIAGNOSIS — D500 Iron deficiency anemia secondary to blood loss (chronic): Secondary | ICD-10-CM | POA: Diagnosis not present

## 2017-12-06 DIAGNOSIS — R195 Other fecal abnormalities: Secondary | ICD-10-CM | POA: Diagnosis not present

## 2017-12-06 DIAGNOSIS — K52831 Collagenous colitis: Secondary | ICD-10-CM | POA: Diagnosis not present

## 2017-12-06 DIAGNOSIS — K2901 Acute gastritis with bleeding: Secondary | ICD-10-CM | POA: Diagnosis not present

## 2017-12-13 DIAGNOSIS — D500 Iron deficiency anemia secondary to blood loss (chronic): Secondary | ICD-10-CM | POA: Diagnosis not present

## 2017-12-21 DIAGNOSIS — E785 Hyperlipidemia, unspecified: Secondary | ICD-10-CM | POA: Diagnosis not present

## 2017-12-21 DIAGNOSIS — F418 Other specified anxiety disorders: Secondary | ICD-10-CM | POA: Diagnosis not present

## 2017-12-21 DIAGNOSIS — I10 Essential (primary) hypertension: Secondary | ICD-10-CM | POA: Diagnosis not present

## 2017-12-21 DIAGNOSIS — E039 Hypothyroidism, unspecified: Secondary | ICD-10-CM | POA: Diagnosis not present

## 2017-12-21 DIAGNOSIS — Z79899 Other long term (current) drug therapy: Secondary | ICD-10-CM | POA: Diagnosis not present

## 2017-12-21 DIAGNOSIS — D510 Vitamin B12 deficiency anemia due to intrinsic factor deficiency: Secondary | ICD-10-CM | POA: Diagnosis not present

## 2018-02-01 DIAGNOSIS — D510 Vitamin B12 deficiency anemia due to intrinsic factor deficiency: Secondary | ICD-10-CM | POA: Diagnosis not present

## 2018-03-01 DIAGNOSIS — N760 Acute vaginitis: Secondary | ICD-10-CM | POA: Diagnosis not present

## 2018-03-01 DIAGNOSIS — Z6827 Body mass index (BMI) 27.0-27.9, adult: Secondary | ICD-10-CM | POA: Diagnosis not present

## 2018-03-23 DIAGNOSIS — D510 Vitamin B12 deficiency anemia due to intrinsic factor deficiency: Secondary | ICD-10-CM | POA: Diagnosis not present

## 2018-04-25 DIAGNOSIS — D500 Iron deficiency anemia secondary to blood loss (chronic): Secondary | ICD-10-CM | POA: Diagnosis not present

## 2018-04-27 DIAGNOSIS — D510 Vitamin B12 deficiency anemia due to intrinsic factor deficiency: Secondary | ICD-10-CM | POA: Diagnosis not present

## 2018-05-04 DIAGNOSIS — Z1212 Encounter for screening for malignant neoplasm of rectum: Secondary | ICD-10-CM | POA: Diagnosis not present

## 2018-05-04 DIAGNOSIS — K52831 Collagenous colitis: Secondary | ICD-10-CM | POA: Diagnosis not present

## 2018-05-04 DIAGNOSIS — D500 Iron deficiency anemia secondary to blood loss (chronic): Secondary | ICD-10-CM | POA: Diagnosis not present

## 2018-05-04 DIAGNOSIS — R195 Other fecal abnormalities: Secondary | ICD-10-CM | POA: Diagnosis not present

## 2018-05-11 DIAGNOSIS — Z96642 Presence of left artificial hip joint: Secondary | ICD-10-CM | POA: Diagnosis not present

## 2018-06-07 DIAGNOSIS — D510 Vitamin B12 deficiency anemia due to intrinsic factor deficiency: Secondary | ICD-10-CM | POA: Diagnosis not present

## 2018-06-14 ENCOUNTER — Other Ambulatory Visit: Payer: Self-pay

## 2018-07-10 DIAGNOSIS — Z6827 Body mass index (BMI) 27.0-27.9, adult: Secondary | ICD-10-CM | POA: Diagnosis not present

## 2018-07-10 DIAGNOSIS — Z0000 Encounter for general adult medical examination without abnormal findings: Secondary | ICD-10-CM | POA: Diagnosis not present

## 2018-07-10 DIAGNOSIS — E039 Hypothyroidism, unspecified: Secondary | ICD-10-CM | POA: Diagnosis not present

## 2018-07-10 DIAGNOSIS — Z9181 History of falling: Secondary | ICD-10-CM | POA: Diagnosis not present

## 2018-07-10 DIAGNOSIS — M810 Age-related osteoporosis without current pathological fracture: Secondary | ICD-10-CM | POA: Diagnosis not present

## 2018-07-10 DIAGNOSIS — I10 Essential (primary) hypertension: Secondary | ICD-10-CM | POA: Diagnosis not present

## 2018-07-10 DIAGNOSIS — D510 Vitamin B12 deficiency anemia due to intrinsic factor deficiency: Secondary | ICD-10-CM | POA: Diagnosis not present

## 2018-07-10 DIAGNOSIS — Z79899 Other long term (current) drug therapy: Secondary | ICD-10-CM | POA: Diagnosis not present

## 2018-09-07 DIAGNOSIS — S2232XA Fracture of one rib, left side, initial encounter for closed fracture: Secondary | ICD-10-CM | POA: Diagnosis not present

## 2018-09-29 ENCOUNTER — Other Ambulatory Visit: Payer: Self-pay

## 2018-10-26 DIAGNOSIS — F418 Other specified anxiety disorders: Secondary | ICD-10-CM | POA: Diagnosis not present

## 2018-10-26 DIAGNOSIS — M722 Plantar fascial fibromatosis: Secondary | ICD-10-CM | POA: Diagnosis not present

## 2018-10-26 DIAGNOSIS — R413 Other amnesia: Secondary | ICD-10-CM | POA: Diagnosis not present

## 2018-10-26 DIAGNOSIS — Z6827 Body mass index (BMI) 27.0-27.9, adult: Secondary | ICD-10-CM | POA: Diagnosis not present

## 2018-12-18 DIAGNOSIS — E785 Hyperlipidemia, unspecified: Secondary | ICD-10-CM | POA: Diagnosis not present

## 2018-12-18 DIAGNOSIS — E039 Hypothyroidism, unspecified: Secondary | ICD-10-CM | POA: Diagnosis not present

## 2018-12-18 DIAGNOSIS — F418 Other specified anxiety disorders: Secondary | ICD-10-CM | POA: Diagnosis not present

## 2018-12-18 DIAGNOSIS — D510 Vitamin B12 deficiency anemia due to intrinsic factor deficiency: Secondary | ICD-10-CM | POA: Diagnosis not present

## 2018-12-18 DIAGNOSIS — I10 Essential (primary) hypertension: Secondary | ICD-10-CM | POA: Diagnosis not present

## 2018-12-18 DIAGNOSIS — Z79899 Other long term (current) drug therapy: Secondary | ICD-10-CM | POA: Diagnosis not present

## 2019-01-19 DIAGNOSIS — E538 Deficiency of other specified B group vitamins: Secondary | ICD-10-CM | POA: Diagnosis not present

## 2019-01-24 DIAGNOSIS — Z6823 Body mass index (BMI) 23.0-23.9, adult: Secondary | ICD-10-CM | POA: Diagnosis not present

## 2019-01-24 DIAGNOSIS — J309 Allergic rhinitis, unspecified: Secondary | ICD-10-CM | POA: Diagnosis not present

## 2019-01-24 DIAGNOSIS — F418 Other specified anxiety disorders: Secondary | ICD-10-CM | POA: Diagnosis not present

## 2019-08-24 DIAGNOSIS — Z23 Encounter for immunization: Secondary | ICD-10-CM | POA: Diagnosis not present

## 2019-08-27 DIAGNOSIS — F418 Other specified anxiety disorders: Secondary | ICD-10-CM | POA: Diagnosis not present

## 2019-08-27 DIAGNOSIS — Z1331 Encounter for screening for depression: Secondary | ICD-10-CM | POA: Diagnosis not present

## 2019-08-27 DIAGNOSIS — G4700 Insomnia, unspecified: Secondary | ICD-10-CM | POA: Diagnosis not present

## 2019-08-27 DIAGNOSIS — Z9181 History of falling: Secondary | ICD-10-CM | POA: Diagnosis not present

## 2019-09-04 DIAGNOSIS — H2511 Age-related nuclear cataract, right eye: Secondary | ICD-10-CM | POA: Diagnosis not present

## 2019-09-14 DIAGNOSIS — L0390 Cellulitis, unspecified: Secondary | ICD-10-CM | POA: Diagnosis not present

## 2019-09-14 DIAGNOSIS — L03116 Cellulitis of left lower limb: Secondary | ICD-10-CM | POA: Diagnosis not present

## 2019-10-10 DIAGNOSIS — E039 Hypothyroidism, unspecified: Secondary | ICD-10-CM | POA: Diagnosis not present

## 2019-10-10 DIAGNOSIS — I10 Essential (primary) hypertension: Secondary | ICD-10-CM | POA: Diagnosis not present

## 2019-10-10 DIAGNOSIS — M810 Age-related osteoporosis without current pathological fracture: Secondary | ICD-10-CM | POA: Diagnosis not present

## 2019-10-10 DIAGNOSIS — Z6820 Body mass index (BMI) 20.0-20.9, adult: Secondary | ICD-10-CM | POA: Diagnosis not present

## 2019-10-10 DIAGNOSIS — F418 Other specified anxiety disorders: Secondary | ICD-10-CM | POA: Diagnosis not present

## 2019-10-10 DIAGNOSIS — E785 Hyperlipidemia, unspecified: Secondary | ICD-10-CM | POA: Diagnosis not present

## 2019-10-17 ENCOUNTER — Inpatient Hospital Stay: Admit: 2019-10-17 | Payer: MEDICARE

## 2019-10-17 DIAGNOSIS — E785 Hyperlipidemia, unspecified: Secondary | ICD-10-CM | POA: Diagnosis not present

## 2019-10-17 DIAGNOSIS — I10 Essential (primary) hypertension: Secondary | ICD-10-CM | POA: Diagnosis not present

## 2019-10-17 DIAGNOSIS — E039 Hypothyroidism, unspecified: Secondary | ICD-10-CM | POA: Diagnosis not present

## 2019-10-17 DIAGNOSIS — M810 Age-related osteoporosis without current pathological fracture: Secondary | ICD-10-CM | POA: Diagnosis not present

## 2019-10-31 LAB — TSH 3RD GENERATION
TSH: 6.51 u[IU]/mL — ABNORMAL HIGH (ref 0.358–3.740)
TSH: 6.51 u[IU]/mL — ABNORMAL HIGH (ref 0.358–3.740)

## 2019-10-31 LAB — METABOLIC PANEL, COMPREHENSIVE
ALT (SGPT): 27 U/L (ref 12–78)
AST (SGOT): 24 U/L (ref 15–37)
Albumin: 3 gm/dl — ABNORMAL LOW (ref 3.4–5.0)
Alk. phosphatase: 106 U/L (ref 45–117)
Anion gap: 6 mmol/L (ref 5–15)
BUN: 26 mg/dl — ABNORMAL HIGH (ref 7–25)
Bilirubin, total: 0.3 mg/dl (ref 0.2–1.0)
CO2: 28 mEq/L (ref 21–32)
Calcium: 9.7 mg/dl (ref 8.5–10.1)
Chloride: 108 mEq/L — ABNORMAL HIGH (ref 98–107)
Creatinine: 1.4 mg/dl — ABNORMAL HIGH (ref 0.6–1.3)
GFR est AA: 46
GFR est non-AA: 38
Glucose: 96 mg/dl (ref 74–106)
Potassium: 4.4 mEq/L (ref 3.5–5.1)
Protein, total: 7.5 gm/dl (ref 6.4–8.2)
Sodium: 142 mEq/L (ref 136–145)

## 2019-10-31 LAB — CBC WITH AUTOMATED DIFF
BASOPHILS: 0.5 % (ref 0–3)
EOSINOPHILS: 1.7 % (ref 0–5)
HCT: 43 % (ref 37.0–50.0)
HGB: 13.5 gm/dl (ref 13.0–17.2)
IMMATURE GRANULOCYTES: 0.4 % (ref 0.0–3.0)
LYMPHOCYTES: 30.5 % (ref 28–48)
MCH: 32.5 pg (ref 25.4–34.6)
MCHC: 31.4 gm/dl (ref 30.0–36.0)
MCV: 103.4 fL — ABNORMAL HIGH (ref 80.0–98.0)
MONOCYTES: 8.8 % (ref 1–13)
MPV: 11.2 fL — ABNORMAL HIGH (ref 6.0–10.0)
NEUTROPHILS: 58.1 % (ref 34–64)
NRBC: 0 (ref 0–0)
PLATELET: 228 10*3/uL (ref 140–450)
RBC: 4.16 M/uL (ref 3.60–5.20)
RDW-SD: 46.3 (ref 36.4–46.3)
WBC: 8.1 10*3/uL (ref 4.0–11.0)

## 2019-10-31 LAB — LIPID PANEL
CHOL/HDL Ratio: 3.7 Ratio (ref 0.0–4.4)
Chol/HDL Ratio: 3.7 Ratio (ref 0.0–4.4)
Cholesterol, Total: 209 mg/dl — ABNORMAL HIGH (ref 140–199)
Cholesterol, total: 209 mg/dl — ABNORMAL HIGH (ref 140–199)
HDL Cholesterol: 57 mg/dl (ref 40–96)
HDL: 57 mg/dl (ref 40–96)
LDL Calculated: 133 mg/dl — ABNORMAL HIGH (ref 0–130)
LDL, calculated: 133 mg/dl — ABNORMAL HIGH (ref 0–130)
Triglyceride: 94 mg/dl (ref 29–150)
Triglycerides: 94 mg/dl (ref 29–150)

## 2019-10-31 LAB — T4, FREE
FREE T4, FT4T: 0.95 ng/dl (ref 0.76–1.46)
Free T4: 0.95 ng/dl (ref 0.76–1.46)

## 2019-10-31 LAB — VITAMIN D, 25 HYDROXY: Vitamin D, 25-OH, Total: 26.5 ng/ml — ABNORMAL LOW (ref 30.0–100.0)

## 2019-10-31 LAB — COMPREHENSIVE METABOLIC PANEL
ALT: 27 U/L (ref 12–78)
AST: 24 U/L (ref 15–37)
Albumin: 3 gm/dl — ABNORMAL LOW (ref 3.4–5.0)
Alkaline Phosphatase: 106 U/L (ref 45–117)
Anion Gap: 6 mmol/L (ref 5–15)
BUN: 26 mg/dl — ABNORMAL HIGH (ref 7–25)
CO2: 28 mEq/L (ref 21–32)
Calcium: 9.7 mg/dl (ref 8.5–10.1)
Chloride: 108 mEq/L — ABNORMAL HIGH (ref 98–107)
Creatinine: 1.4 mg/dl — ABNORMAL HIGH (ref 0.6–1.3)
EGFR IF NonAfrican American: 38
GFR African American: 46
Glucose: 96 mg/dl (ref 74–106)
Potassium: 4.4 mEq/L (ref 3.5–5.1)
Sodium: 142 mEq/L (ref 136–145)
Total Bilirubin: 0.3 mg/dl (ref 0.2–1.0)
Total Protein: 7.5 gm/dl (ref 6.4–8.2)

## 2019-10-31 LAB — CBC WITH AUTO DIFFERENTIAL
Basophils %: 0.5 % (ref 0–3)
Eosinophils %: 1.7 % (ref 0–5)
Hematocrit: 43 % (ref 37.0–50.0)
Hemoglobin: 13.5 gm/dl (ref 13.0–17.2)
Immature Granulocytes: 0.4 % (ref 0.0–3.0)
Lymphocytes %: 30.5 % (ref 28–48)
MCH: 32.5 pg (ref 25.4–34.6)
MCHC: 31.4 gm/dl (ref 30.0–36.0)
MCV: 103.4 fL — ABNORMAL HIGH (ref 80.0–98.0)
MPV: 11.2 fL — ABNORMAL HIGH (ref 6.0–10.0)
Monocytes %: 8.8 % (ref 1–13)
Neutrophils %: 58.1 % (ref 34–64)
Nucleated RBCs: 0 (ref 0–0)
Platelets: 228 10*3/uL (ref 140–450)
RBC: 4.16 M/uL (ref 3.60–5.20)
RDW-SD: 46.3 (ref 36.4–46.3)
WBC: 8.1 10*3/uL (ref 4.0–11.0)

## 2019-10-31 LAB — VITAMIN D 25 HYDROXY: Vitamin D, 25-OH, Total: 26.5 ng/ml — ABNORMAL LOW (ref 30.0–100.0)

## 2020-01-07 DIAGNOSIS — E039 Hypothyroidism, unspecified: Secondary | ICD-10-CM | POA: Diagnosis not present

## 2020-01-07 DIAGNOSIS — Z681 Body mass index (BMI) 19 or less, adult: Secondary | ICD-10-CM | POA: Diagnosis not present

## 2020-01-07 DIAGNOSIS — Z760 Encounter for issue of repeat prescription: Secondary | ICD-10-CM | POA: Diagnosis not present

## 2020-01-07 DIAGNOSIS — I10 Essential (primary) hypertension: Secondary | ICD-10-CM | POA: Diagnosis not present

## 2020-01-07 DIAGNOSIS — E785 Hyperlipidemia, unspecified: Secondary | ICD-10-CM | POA: Diagnosis not present

## 2020-01-07 DIAGNOSIS — E538 Deficiency of other specified B group vitamins: Secondary | ICD-10-CM | POA: Diagnosis not present

## 2020-01-07 DIAGNOSIS — M810 Age-related osteoporosis without current pathological fracture: Secondary | ICD-10-CM | POA: Diagnosis not present

## 2020-01-07 DIAGNOSIS — F418 Other specified anxiety disorders: Secondary | ICD-10-CM | POA: Diagnosis not present

## 2020-01-16 ENCOUNTER — Inpatient Hospital Stay: Admit: 2020-01-16 | Payer: MEDICARE

## 2020-01-16 DIAGNOSIS — E538 Deficiency of other specified B group vitamins: Secondary | ICD-10-CM | POA: Diagnosis not present

## 2020-01-16 DIAGNOSIS — I10 Essential (primary) hypertension: Secondary | ICD-10-CM | POA: Diagnosis not present

## 2020-01-16 DIAGNOSIS — E039 Hypothyroidism, unspecified: Secondary | ICD-10-CM | POA: Diagnosis not present

## 2020-01-16 DIAGNOSIS — E785 Hyperlipidemia, unspecified: Secondary | ICD-10-CM | POA: Diagnosis not present

## 2020-01-16 DIAGNOSIS — M810 Age-related osteoporosis without current pathological fracture: Secondary | ICD-10-CM | POA: Diagnosis not present

## 2020-01-16 LAB — CBC WITH AUTOMATED DIFF
BASOPHILS: 0.5 % (ref 0–3)
EOSINOPHILS: 1.8 % (ref 0–5)
HCT: 36.3 % — ABNORMAL LOW (ref 37.0–50.0)
HGB: 11.1 gm/dl — ABNORMAL LOW (ref 13.0–17.2)
IMMATURE GRANULOCYTES: 0.4 % (ref 0.0–3.0)
LYMPHOCYTES: 23.6 % — ABNORMAL LOW (ref 28–48)
MCH: 31.7 pg (ref 25.4–34.6)
MCHC: 30.6 gm/dl (ref 30.0–36.0)
MCV: 103.7 fL — ABNORMAL HIGH (ref 80.0–98.0)
MONOCYTES: 9 % (ref 1–13)
MPV: 10.5 fL — ABNORMAL HIGH (ref 6.0–10.0)
NEUTROPHILS: 64.7 % — ABNORMAL HIGH (ref 34–64)
NRBC: 0 (ref 0–0)
PLATELET: 321 10*3/uL (ref 140–450)
RBC: 3.5 M/uL — ABNORMAL LOW (ref 3.60–5.20)
RDW-SD: 48.9 — ABNORMAL HIGH (ref 36.4–46.3)
WBC: 8.6 10*3/uL (ref 4.0–11.0)

## 2020-01-16 LAB — VITAMIN D, 25 HYDROXY: Vitamin D, 25-OH, Total: 27 ng/ml — ABNORMAL LOW (ref 30.0–100.0)

## 2020-01-16 LAB — METABOLIC PANEL, COMPREHENSIVE
ALT (SGPT): 23 U/L (ref 12–78)
AST (SGOT): 22 U/L (ref 15–37)
Albumin: 2.6 gm/dl — ABNORMAL LOW (ref 3.4–5.0)
Alk. phosphatase: 91 U/L (ref 45–117)
Anion gap: 8 mmol/L (ref 5–15)
BUN: 20 mg/dl (ref 7–25)
Bilirubin, total: 0.4 mg/dl (ref 0.2–1.0)
CO2: 25 mEq/L (ref 21–32)
Calcium: 9.4 mg/dl (ref 8.5–10.1)
Chloride: 107 mEq/L (ref 98–107)
Creatinine: 1.2 mg/dl (ref 0.6–1.3)
GFR est AA: 55
GFR est non-AA: 46
Glucose: 86 mg/dl (ref 74–106)
Potassium: 3.8 mEq/L (ref 3.5–5.1)
Protein, total: 7.7 gm/dl (ref 6.4–8.2)
Sodium: 140 mEq/L (ref 136–145)

## 2020-01-16 LAB — LIPID PANEL
CHOL/HDL Ratio: 3.9 Ratio (ref 0.0–4.4)
Chol/HDL Ratio: 3.9 Ratio (ref 0.0–4.4)
Cholesterol, Total: 182 mg/dl (ref 140–199)
Cholesterol, total: 182 mg/dl (ref 140–199)
HDL Cholesterol: 47 mg/dl (ref 40–96)
HDL: 47 mg/dl (ref 40–96)
LDL Calculated: 120 mg/dl (ref 0–130)
LDL, calculated: 120 mg/dl (ref 0–130)
Triglyceride: 74 mg/dl (ref 29–150)
Triglycerides: 74 mg/dl (ref 29–150)

## 2020-01-16 LAB — TSH 3RD GENERATION
TSH: 5.15 u[IU]/mL — ABNORMAL HIGH (ref 0.358–3.740)
TSH: 5.15 u[IU]/mL — ABNORMAL HIGH (ref 0.358–3.740)

## 2020-01-16 LAB — T4, FREE
Free T4: 1.13 ng/dl (ref 0.76–1.46)
T4 Free: 1.13 ng/dL (ref 0.76–1.46)

## 2020-01-16 LAB — CBC WITH AUTO DIFFERENTIAL
Basophils %: 0.5 % (ref 0–3)
Eosinophils %: 1.8 % (ref 0–5)
Hematocrit: 36.3 % — ABNORMAL LOW (ref 37.0–50.0)
Hemoglobin: 11.1 gm/dl — ABNORMAL LOW (ref 13.0–17.2)
Immature Granulocytes %: 0.4 % (ref 0.0–3.0)
Lymphocytes %: 23.6 % — ABNORMAL LOW (ref 28–48)
MCH: 31.7 pg (ref 25.4–34.6)
MCHC: 30.6 gm/dl (ref 30.0–36.0)
MCV: 103.7 fL — ABNORMAL HIGH (ref 80.0–98.0)
MPV: 10.5 fL — ABNORMAL HIGH (ref 6.0–10.0)
Monocytes %: 9 % (ref 1–13)
Neutrophils %: 64.7 % — ABNORMAL HIGH (ref 34–64)
Nucleated RBCs: 0 (ref 0–0)
Platelets: 321 10*3/uL (ref 140–450)
RBC: 3.5 M/uL — ABNORMAL LOW (ref 3.60–5.20)
RDW-SD: 48.9 — ABNORMAL HIGH (ref 36.4–46.3)
WBC: 8.6 10*3/uL (ref 4.0–11.0)

## 2020-01-16 LAB — COMPREHENSIVE METABOLIC PANEL
ALT: 23 U/L (ref 12–78)
AST: 22 U/L (ref 15–37)
Albumin: 2.6 gm/dl — ABNORMAL LOW (ref 3.4–5.0)
Alkaline Phosphatase: 91 U/L (ref 45–117)
Anion Gap: 8 mmol/L (ref 5–15)
BUN: 20 mg/dl (ref 7–25)
CO2: 25 mEq/L (ref 21–32)
Calcium: 9.4 mg/dl (ref 8.5–10.1)
Chloride: 107 mEq/L (ref 98–107)
Creatinine: 1.2 mg/dl (ref 0.6–1.3)
GFR African American: 55
Glucose: 86 mg/dl (ref 74–106)
Potassium: 3.8 mEq/L (ref 3.5–5.1)
Sodium: 140 mEq/L (ref 136–145)
Total Bilirubin: 0.4 mg/dl (ref 0.2–1.0)
Total Protein: 7.7 gm/dl (ref 6.4–8.2)
eGFR NON-AA: 46

## 2020-01-16 LAB — VITAMIN D 25 HYDROXY: Vit D, 25-Hydroxy: 27 ng/mL — ABNORMAL LOW (ref 30.0–100.0)

## 2020-01-17 LAB — VITAMIN B12
Vitamin B-12: 1190 pg/mL — ABNORMAL HIGH (ref 193–986)
Vitamin B12: 1190 pg/ml — ABNORMAL HIGH (ref 193–986)

## 2020-09-05 ENCOUNTER — Inpatient Hospital Stay: Admit: 2020-09-05 | Payer: MEDICARE

## 2020-09-05 DIAGNOSIS — E785 Hyperlipidemia, unspecified: Secondary | ICD-10-CM

## 2020-09-05 LAB — METABOLIC PANEL, COMPREHENSIVE
ALT (SGPT): 17 U/L (ref 12–78)
AST (SGOT): 19 U/L (ref 15–37)
Albumin: 2.9 gm/dl — ABNORMAL LOW (ref 3.4–5.0)
Alk. phosphatase: 139 U/L — ABNORMAL HIGH (ref 45–117)
Anion gap: 7 mmol/L (ref 5–15)
BUN: 48 mg/dl — ABNORMAL HIGH (ref 7–25)
Bilirubin, total: 0.5 mg/dl (ref 0.2–1.0)
CO2: 24 mEq/L (ref 21–32)
Calcium: 9.7 mg/dl (ref 8.5–10.1)
Chloride: 103 mEq/L (ref 98–107)
Creatinine: 1.5 mg/dl — ABNORMAL HIGH (ref 0.6–1.3)
GFR est AA: 43
GFR est non-AA: 35
Glucose: 100 mg/dl (ref 74–106)
Potassium: 4.9 mEq/L (ref 3.5–5.1)
Protein, total: 7.6 gm/dl (ref 6.4–8.2)
Sodium: 134 mEq/L — ABNORMAL LOW (ref 136–145)

## 2020-09-05 LAB — CBC WITH AUTOMATED DIFF
BASOPHILS: 0.5 % (ref 0–3)
EOSINOPHILS: 2.7 % (ref 0–5)
HCT: 35.7 % — ABNORMAL LOW (ref 37.0–50.0)
HGB: 11.4 gm/dl — ABNORMAL LOW (ref 13.0–17.2)
IMMATURE GRANULOCYTES: 0.4 % (ref 0.0–3.0)
LYMPHOCYTES: 20.2 % — ABNORMAL LOW (ref 28–48)
MCH: 29.8 pg (ref 25.4–34.6)
MCHC: 31.9 gm/dl (ref 30.0–36.0)
MCV: 93.5 fL (ref 80.0–98.0)
MONOCYTES: 8.3 % (ref 1–13)
MPV: 9.5 fL (ref 6.0–10.0)
NEUTROPHILS: 67.9 % — ABNORMAL HIGH (ref 34–64)
NRBC: 0 (ref 0–0)
PLATELET: 399 10*3/uL (ref 140–450)
RBC: 3.82 M/uL (ref 3.60–5.20)
RDW-SD: 45.8 (ref 36.4–46.3)
WBC: 11.6 10*3/uL — ABNORMAL HIGH (ref 4.0–11.0)

## 2020-09-05 LAB — T4, FREE
Free T4: 1.1 ng/dl (ref 0.76–1.46)
T4 Free: 1.1 ng/dL (ref 0.76–1.46)

## 2020-09-05 LAB — LIPID PANEL
CHOL/HDL Ratio: 4.3 Ratio (ref 0.0–4.4)
Chol/HDL Ratio: 4.3 Ratio (ref 0.0–4.4)
Cholesterol, Total: 199 mg/dl (ref 140–199)
Cholesterol, total: 199 mg/dl (ref 140–199)
HDL Cholesterol: 46 mg/dl (ref 40–96)
HDL: 46 mg/dl (ref 40–96)
LDL Calculated: 137 mg/dl — ABNORMAL HIGH (ref 0–130)
LDL, calculated: 137 mg/dl — ABNORMAL HIGH (ref 0–130)
Triglyceride: 79 mg/dl (ref 29–150)
Triglycerides: 79 mg/dl (ref 29–150)

## 2020-09-05 LAB — TSH 3RD GENERATION
TSH: 11.7 u[IU]/mL — ABNORMAL HIGH (ref 0.358–3.740)
TSH: 11.7 u[IU]/mL — ABNORMAL HIGH (ref 0.358–3.740)

## 2020-09-05 LAB — CBC WITH AUTO DIFFERENTIAL
Basophils %: 0.5 % (ref 0–3)
Eosinophils %: 2.7 % (ref 0–5)
Hematocrit: 35.7 % — ABNORMAL LOW (ref 37.0–50.0)
Hemoglobin: 11.4 gm/dl — ABNORMAL LOW (ref 13.0–17.2)
Immature Granulocytes %: 0.4 % (ref 0.0–3.0)
Lymphocytes %: 20.2 % — ABNORMAL LOW (ref 28–48)
MCH: 29.8 pg (ref 25.4–34.6)
MCHC: 31.9 gm/dl (ref 30.0–36.0)
MCV: 93.5 fL (ref 80.0–98.0)
MPV: 9.5 fL (ref 6.0–10.0)
Monocytes %: 8.3 % (ref 1–13)
Neutrophils %: 67.9 % — ABNORMAL HIGH (ref 34–64)
Nucleated RBCs: 0 (ref 0–0)
Platelets: 399 10*3/uL (ref 140–450)
RBC: 3.82 M/uL (ref 3.60–5.20)
RDW-SD: 45.8 (ref 36.4–46.3)
WBC: 11.6 10*3/uL — ABNORMAL HIGH (ref 4.0–11.0)

## 2020-09-05 LAB — COMPREHENSIVE METABOLIC PANEL
ALT: 17 U/L (ref 12–78)
AST: 19 U/L (ref 15–37)
Albumin: 2.9 gm/dl — ABNORMAL LOW (ref 3.4–5.0)
Alkaline Phosphatase: 139 U/L — ABNORMAL HIGH (ref 45–117)
Anion Gap: 7 mmol/L (ref 5–15)
BUN: 48 mg/dl — ABNORMAL HIGH (ref 7–25)
CO2: 24 mEq/L (ref 21–32)
Calcium: 9.7 mg/dl (ref 8.5–10.1)
Chloride: 103 mEq/L (ref 98–107)
Creatinine: 1.5 mg/dl — ABNORMAL HIGH (ref 0.6–1.3)
GFR African American: 43
Glucose: 100 mg/dl (ref 74–106)
Potassium: 4.9 mEq/L (ref 3.5–5.1)
Sodium: 134 mEq/L — ABNORMAL LOW (ref 136–145)
Total Bilirubin: 0.5 mg/dl (ref 0.2–1.0)
Total Protein: 7.6 gm/dl (ref 6.4–8.2)
eGFR NON-AA: 35

## 2020-12-15 ENCOUNTER — Emergency Department: Admit: 2020-12-15 | Payer: MEDICARE

## 2020-12-15 ENCOUNTER — Inpatient Hospital Stay
Admit: 2020-12-15 | Discharge: 2020-12-20 | Disposition: A | Discharge status: Home Health | Payer: MEDICARE | Attending: Internal Medicine | Admitting: Internal Medicine

## 2020-12-15 DIAGNOSIS — I951 Orthostatic hypotension: Secondary | ICD-10-CM

## 2020-12-15 LAB — CBC WITH AUTOMATED DIFF
BASOPHILS: 0.4 % (ref 0–3)
EOSINOPHILS: 1.6 % (ref 0–5)
HCT: 34.2 % — ABNORMAL LOW (ref 37.0–50.0)
HGB: 11 gm/dl — ABNORMAL LOW (ref 13.0–17.2)
IMMATURE GRANULOCYTES: 0.4 % (ref 0.0–3.0)
LYMPHOCYTES: 21.1 % — ABNORMAL LOW (ref 28–48)
MCH: 28.4 pg (ref 25.4–34.6)
MCHC: 32.2 gm/dl (ref 30.0–36.0)
MCV: 88.4 fL (ref 80.0–98.0)
MONOCYTES: 7.9 % (ref 1–13)
MPV: 9.2 fL (ref 6.0–10.0)
NEUTROPHILS: 68.6 % — ABNORMAL HIGH (ref 34–64)
NRBC: 0 (ref 0–0)
PLATELET: 340 10*3/uL (ref 140–450)
RBC: 3.87 M/uL (ref 3.60–5.20)
RDW-SD: 43.9 (ref 36.4–46.3)
WBC: 8.1 10*3/uL (ref 4.0–11.0)

## 2020-12-15 LAB — POC URINE MICROSCOPIC

## 2020-12-15 LAB — METABOLIC PANEL, COMPREHENSIVE
ALT (SGPT): 13 U/L (ref 12–78)
AST (SGOT): 14 U/L — ABNORMAL LOW (ref 15–37)
Albumin: 2.4 gm/dl — ABNORMAL LOW (ref 3.4–5.0)
Alk. phosphatase: 121 U/L — ABNORMAL HIGH (ref 45–117)
Anion gap: 6 mmol/L (ref 5–15)
BUN: 33 mg/dl — ABNORMAL HIGH (ref 7–25)
Bilirubin, total: 0.2 mg/dl (ref 0.2–1.0)
CO2: 20 mEq/L — ABNORMAL LOW (ref 21–32)
Calcium: 9.7 mg/dl (ref 8.5–10.1)
Chloride: 110 mEq/L — ABNORMAL HIGH (ref 98–107)
Creatinine: 1.5 mg/dl — ABNORMAL HIGH (ref 0.6–1.3)
GFR est AA: 43
GFR est non-AA: 35
Glucose: 101 mg/dl (ref 74–106)
Potassium: 4.5 mEq/L (ref 3.5–5.1)
Protein, total: 7.5 gm/dl (ref 6.4–8.2)
Sodium: 136 mEq/L (ref 136–145)

## 2020-12-15 LAB — POC URINE MACROSCOPIC
Bilirubin, Urine: NEGATIVE
Bilirubin: NEGATIVE
Glucose, Ur: NEGATIVE mg/dl
Glucose: NEGATIVE mg/dl
Ketone: NEGATIVE mg/dl
Ketones, Urine: NEGATIVE mg/dl
Nitrite, Urine: NEGATIVE
Nitrites: NEGATIVE
Specific Gravity, UA: >=1.03 (ref 1.005–1.030)
Specific gravity: >=1.03 (ref 1.005–1.030)
Urobilinogen, UA, POCT: 1 EU/dl (ref 0.0–1.0)
Urobilinogen: 1 EU/dl (ref 0.0–1.0)
pH (UA): 5.5 (ref 5–9)
pH, UA: 5.5 (ref 5–9)

## 2020-12-15 LAB — TROPONIN-HIGH SENSITIVITY: Troponin-High Sensitivity: 8 ng/L (ref 0–59)

## 2020-12-15 LAB — T4, FREE
Free T4: 1.36 ng/dl (ref 0.76–1.46)
T4 Free: 1.36 ng/dL (ref 0.76–1.46)

## 2020-12-15 LAB — TSH 3RD GENERATION
TSH: 1.85 u[IU]/mL (ref 0.358–3.740)
TSH: 1.85 u[IU]/mL (ref 0.358–3.740)

## 2020-12-15 LAB — MAGNESIUM
Magnesium: 2.2 mg/dl (ref 1.6–2.6)
Magnesium: 2.2 mg/dl (ref 1.6–2.6)

## 2020-12-15 LAB — CBC WITH AUTO DIFFERENTIAL
Basophils %: 0.4 % (ref 0–3)
Eosinophils %: 1.6 % (ref 0–5)
Hematocrit: 34.2 % — ABNORMAL LOW (ref 37.0–50.0)
Hemoglobin: 11 gm/dl — ABNORMAL LOW (ref 13.0–17.2)
Immature Granulocytes %: 0.4 % (ref 0.0–3.0)
Lymphocytes %: 21.1 % — ABNORMAL LOW (ref 28–48)
MCH: 28.4 pg (ref 25.4–34.6)
MCHC: 32.2 gm/dl (ref 30.0–36.0)
MCV: 88.4 fL (ref 80.0–98.0)
MPV: 9.2 fL (ref 6.0–10.0)
Monocytes %: 7.9 % (ref 1–13)
Neutrophils %: 68.6 % — ABNORMAL HIGH (ref 34–64)
Nucleated RBCs: 0 (ref 0–0)
Platelets: 340 10*3/uL (ref 140–450)
RBC: 3.87 M/uL (ref 3.60–5.20)
RDW-SD: 43.9 (ref 36.4–46.3)
WBC: 8.1 10*3/uL (ref 4.0–11.0)

## 2020-12-15 LAB — COMPREHENSIVE METABOLIC PANEL
ALT: 13 U/L (ref 12–78)
AST: 14 U/L — ABNORMAL LOW (ref 15–37)
Albumin: 2.4 gm/dl — ABNORMAL LOW (ref 3.4–5.0)
Alkaline Phosphatase: 121 U/L — ABNORMAL HIGH (ref 45–117)
Anion Gap: 6 mmol/L (ref 5–15)
BUN: 33 mg/dl — ABNORMAL HIGH (ref 7–25)
CO2: 20 mEq/L — ABNORMAL LOW (ref 21–32)
Calcium: 9.7 mg/dl (ref 8.5–10.1)
Chloride: 110 mEq/L — ABNORMAL HIGH (ref 98–107)
Creatinine: 1.5 mg/dl — ABNORMAL HIGH (ref 0.6–1.3)
GFR African American: 43
Glucose: 101 mg/dl (ref 74–106)
Potassium: 4.5 mEq/L (ref 3.5–5.1)
Sodium: 136 mEq/L (ref 136–145)
Total Bilirubin: 0.2 mg/dl (ref 0.2–1.0)
Total Protein: 7.5 gm/dl (ref 6.4–8.2)
eGFR NON-AA: 35

## 2020-12-15 LAB — TROPONIN, HIGH SENSITIVITY: Troponin, High Sensitivity: 8 ng/L (ref 0–59)

## 2020-12-15 MED ORDER — CEFTRIAXONE 1 GRAM SOLUTION FOR INJECTION
1 gram | INTRAMUSCULAR | Status: AC
Start: 2020-12-15 — End: 2020-12-15
  Administered 2020-12-15: via INTRAVENOUS

## 2020-12-15 MED ORDER — SODIUM CHLORIDE 0.9% BOLUS IV
0.9 % | INTRAVENOUS | Status: AC
Start: 2020-12-15 — End: 2020-12-15
  Administered 2020-12-15: 21:00:00 via INTRAVENOUS

## 2020-12-15 MED ORDER — SODIUM CHLORIDE 0.9 % IJ SYRG
Freq: Once | INTRAMUSCULAR | Status: AC
Start: 2020-12-15 — End: 2020-12-15
  Administered 2020-12-15: 20:00:00 via INTRAVENOUS

## 2020-12-15 MED FILL — CEFTRIAXONE 1 GRAM SOLUTION FOR INJECTION: 1 gram | INTRAMUSCULAR | Qty: 1

## 2020-12-15 NOTE — ED Notes (Signed)
Orthostatic VS:   Lying BP: 198/59  Lying HR: 65    Sitting BP: 135/89  Sitting HR: 73    Standing BP: 129/49  Standing HR: 73

## 2020-12-15 NOTE — ED Notes (Signed)
Updated pts granddaughter Gearldine Bienenstock at this time, all questions answered. She can best be reached at [phone].

## 2020-12-15 NOTE — H&P (Signed)
Hospitalist Admission History and Physical    NAME:  Beverly Nguyen   DOB:   [birth year]   MRN:   [id number]     PCP:  None  Admission Date/Time:  12/15/2020 11:18 PM  Anticipated Date of Discharge: 12/18/2020  Anticipated Disposition (home, SNF) : HH         Assessment/Plan:      Active Problems:    Subdural hematoma ([place]) (12/15/2020)       Altered mental status secondary to metabolic encephalopathy   Subdural hematoma  Syncope  UTI  Hypertension  Hyperlipidemia  Hypothyroidism        ___________________________________________________  PLAN:    Admit the patient to telemetry  Neurochecks every 4 hours  Gentle hydration  Neurosurgery consult  Antibiotic, urine culture  Likely will need serial CT scans to ensure resolution        Risk of deterioration:  [] Low    [x] Moderate  [] High    Prophylaxis:  [] Lovenox  [] Coumadin  [] Hep SQ  [x] SCD???s  [] H2B/PPI[] Eliquis [] Xarelto     Disposition:  [] Home w/ Family   [x] HH PT,OT,RN   [] SNF/LTC   [] SAH/Rehab             Subjective:   CHIEF COMPLAINT:    Chief Complaint   Patient presents with   ??? Syncope       HISTORY OF PRESENT ILLNESS:     Beverly Nguyen is a 86 y.o. WHITE/NON-HISPANIC female who presents with passing out episode  Patient states that she was told that he passed out but does not remember anything  Patient was found lethargic at home not eating and drinking well unable to obtain further history because of dementia    Past Medical History:   Diagnosis Date   ??? Dementia ([place])    ??? High cholesterol    ??? Hypertension    ??? S/P hip replacement    ??? Thyroid disorder         Surgical history  States she has had hysterectomy    Social History     Tobacco Use   ??? Smoking status: Former Smoker   ??? Smokeless tobacco: Never Used   ??? Tobacco comment: dementia   Substance Use Topics   ??? Alcohol use: Never        Family History   Family history unknown: Yes   Mother died of old age  Father died of liver cirrhosis    Allergies   Allergen Reactions   ??? Pcn [Penicillins] Hives        Prior to  Admission Medications   Prescriptions Last Dose Informant Patient Reported? Taking?   donepeziL (Aricept) 10 mg tablet   Yes Yes   Sig: Take 10 mg by mouth nightly.   levothyroxine (synthroid) 25 mcg tablet   Yes Yes   Sig: Take 25 mcg by mouth Daily (before breakfast).   memantine (Namenda) 10 mg tablet   Yes Yes   Sig: Take 10 mg by mouth two (2) times a day.   pravastatin (PravachoL) 80 mg tablet   Yes Yes   Sig: Take 80 mg by mouth daily.      Facility-Administered Medications: None           REVIEW OF SYSTEMS:     []  Unable to obtain  ROS due to  [x] mental status change  [] sedated   [] intubated        Objective:   VITALS:    Visit Vitals  BP (!) 188/63  Pulse 64   Temp 97.7 ??F (36.5 ??C)   Resp 18   Ht 5' 1"  (1.549 m)   Wt 54.4 kg (120 lb)   SpO2 99%   BMI 22.67 kg/m??     Temp (24hrs), Avg:97.7 ??F (36.5 ??C), Min:97.7 ??F (36.5 ??C), Max:97.7 ??F (36.5 ??C)      PHYSICAL EXAM: (Seen with PPE , gloves , gown , mask n95 and goggles )  General:    Alert, cooperative, no distress, appears stated age.     Head:   Normocephalic, without obvious abnormality, atraumatic.  Eyes:   Conjunctivae clear, anicteric sclerae.  Pupils are equal  Nose:  Nares normal. No drainage or sinus tenderness.  Throat:    Lips, mucosa, and tongue normal.  No Thrush  Neck:  Supple, symmetrical,  no adenopathy, thyroid: non tender    no carotid bruit and no JVD.  Back:    Symmetric,  No CVA tenderness.  Lungs:   Clear to auscultation bilaterally.  No Wheezing or Rhonchi. No rales.  Chest wall:  No tenderness or deformity. No Accessory muscle use.  Heart:   Regular rate and rhythm,  no murmur, rub or gallop.  Abdomen:   Soft, non-tender. Not distended.  Bowel sounds normal. No masses  Extremities: Extremities normal, atraumatic, No cyanosis.  No edema. No clubbing  Skin:     Texture, turgor normal. No rashes or lesions.  Not Jaundiced  Psych:  Flat affect.  Neurologic: Patient is awake, following simple commands      LAB DATA REVIEWED:    Recent  Results (from the past 12 hour(s))   CBC WITH AUTOMATED DIFF    Collection Time: 12/15/20  4:20 PM   Result Value Ref Range    WBC 8.1 4.0 - 11.0 1000/mm3    RBC 3.87 3.60 - 5.20 M/uL    HGB 11.0 (L) 13.0 - 17.2 gm/dl    HCT 34.2 (L) 37.0 - 50.0 %    MCV 88.4 80.0 - 98.0 fL    MCH 28.4 25.4 - 34.6 pg    MCHC 32.2 30.0 - 36.0 gm/dl    PLATELET 340 140 - 450 1000/mm3    MPV 9.2 6.0 - 10.0 fL    RDW-SD 43.9 36.4 - 46.3      NRBC 0 0 - 0      IMMATURE GRANULOCYTES 0.4 0.0 - 3.0 %    NEUTROPHILS 68.6 (H) 34 - 64 %    LYMPHOCYTES 21.1 (L) 28 - 48 %    MONOCYTES 7.9 1 - 13 %    EOSINOPHILS 1.6 0 - 5 %    BASOPHILS 0.4 0 - 3 %   METABOLIC PANEL, COMPREHENSIVE    Collection Time: 12/15/20  4:20 PM   Result Value Ref Range    Sodium 136 136 - 145 mEq/L    Potassium 4.5 3.5 - 5.1 mEq/L    Chloride 110 (H) 98 - 107 mEq/L    CO2 20 (L) 21 - 32 mEq/L    Glucose 101 74 - 106 mg/dl    BUN 33 (H) 7 - 25 mg/dl    Creatinine 1.5 (H) 0.6 - 1.3 mg/dl    GFR est AA 43.0      GFR est non-AA 35      Calcium 9.7 8.5 - 10.1 mg/dl    AST (SGOT) 14 (L) 15 - 37 U/L    ALT (SGPT) 13 12 - 78 U/L    Alk. phosphatase 121 (  H) 45 - 117 U/L    Bilirubin, total 0.2 0.2 - 1.0 mg/dl    Protein, total 7.5 6.4 - 8.2 gm/dl    Albumin 2.4 (L) 3.4 - 5.0 gm/dl    Anion gap 6 5 - 15 mmol/L   TROPONIN-HIGH SENSITIVITY    Collection Time: 12/15/20  4:20 PM   Result Value Ref Range    Troponin-High Sensitivity 8 0 - 59 ng/L   T4, FREE    Collection Time: 12/15/20  4:20 PM   Result Value Ref Range    Free T4 1.36 0.76 - 1.46 ng/dl   TSH 3RD GENERATION    Collection Time: 12/15/20  4:20 PM   Result Value Ref Range    TSH 1.850 0.358 - 3.740 uIU/mL   MAGNESIUM    Collection Time: 12/15/20  4:20 PM   Result Value Ref Range    Magnesium 2.2 1.6 - 2.6 mg/dl   SARS-COV-2_FLU    Collection Time: 12/15/20  5:52 PM    Specimen: NASOPHARYNGEAL SWAB   Result Value Ref Range    COVID-19 NEGATIVE NEGATIVE      Influenza A PCR NEGATIVE NEGATIVE      Influenza B PCR NEGATIVE  NEGATIVE     POC URINE MACROSCOPIC    Collection Time: 12/15/20  6:07 PM   Result Value Ref Range    Glucose Negative NEGATIVE,Negative mg/dl    Bilirubin Negative NEGATIVE,Negative      Ketone Negative NEGATIVE,Negative mg/dl    Specific gravity >=1.030 1.005 - 1.030      Blood Small (A) NEGATIVE,Negative      pH (UA) 5.5 5 - 9      Protein Trace (A) NEGATIVE,Negative mg/dl    Urobilinogen 1.0 0.0 - 1.0 EU/dl    Nitrites Negative NEGATIVE,Negative      Leukocyte Esterase Large (A) NEGATIVE,Negative      Color Yellow      Appearance Slightly Cloudy     POC URINE MICROSCOPIC    Collection Time: 12/15/20  6:07 PM   Result Value Ref Range    Epithelial cells, squamous 1-4 /LPF    WBC 30-49 /HPF    RBC 1-4 /HPF    Bacteria 2+ /HPF         IMAGING RESULTS:    XR CHEST SNGL V    Result Date: 12/15/2020  INDICATION: general weakness. Dyspnea COMPARISON: none TECHNIQUE: Single view chest.     IMPRESSION: Heart size upper normal. Mild bibasilar atelectatic change. Otherwise, no focal infiltrates. Multiple left rib fractures, which may be subacute or chronic. Acute on chronic rib fractures not excluded. Please correlate clinically.     CT HEAD WO CONT    Result Date: 12/15/2020  INDICATION: syncope. Lethargy COMPARISON: none TECHNIQUE: Axial Head CT without IV contrast. Sagittal and coronal reconstructions were performed. All CT exams at this facility use one or more dose reduction techniques including automatic exposure control, mA/kV adjustment per patient's size, or iterative reconstruction technique. FINDINGS: There is age related cerebral atrophy with chronic small vessel ischemic changes in the white matter. Prominence of the extra-axial CSF spaces, left greater than right (approximately 5 mm maximal thickness in the left inferior frontal region). This likely reflects pronounced peripheral atrophy. Small chronic subdural hygroma could be another differential consideration. There is question of subtle subdural collection  in the right peripheral temporal region with slight hyperdensity measuring 5 mm in maximal thickness. This is observed on image 22 series 6 and image. Similar changes seen  in the contralateral left temporal region. This may reflect subtle subdural hematoma versus beam hardening artifact. Elsewhere, there is no mass, hemorrhage, acute ischemia, or hydrocephalus. Calvarium is intact.     IMPRESSION: 1.  Beam hardening artifact versus subtle subdural hematoma in the bilateral temporal region. 2.  Otherwise, no acute intracranial abnormalities. 3.  Prominent extra-axial CSF space bilateral frontal regions, left greater than right. This likely reflects peripheral atrophy. Small chronic subdural hygroma could be another differential consideration. Findings were reported to Dr Alisa Graff on 12/15/2020 4:08 PM EST.         Care Plan discussed with:     [] Patient   [] Family    [] ED Care Manager  [] ED Doc   [] Specialist :    Total care time spent on reviewing the case/data/notes/EMR,examining the patient,documentation,coordinating care with nurses and consultants is 40  minutes.       ___________________________________________________  Admitting Physician: Charlaine Dalton, MD     Dragon medical dictation software was used for portions of this report.  Unintended voice transcription errors may have occurred.

## 2020-12-15 NOTE — ED Notes (Signed)
Pt comes by ems from home- her caregiver found her lethargic in her bed. Pt states she hasnt been eating or drinking in the past day or so. [id number]

## 2020-12-15 NOTE — ED Notes (Signed)
Pt screaming out saying "where are the adults?, where is my family?" I need to speak with the adults. Informed pt that I am her nurse and that I have updated her granddaughter Gearldine Bienenstock. Pt able to be reoriented by keeps calling out every so often. Will continue to monitor.

## 2020-12-15 NOTE — Progress Notes (Signed)
 [facility] Services: Medication History    Medication History Completed Prior to Order Reconciliation?  YES  If no and discrepancies were noted please contact attending physician or pharmacist to follow-up: N/A - Medication history was obtained prior to reconciliation.    Information obtained from (list all that apply, 2 sources preferred): Pharmacy (Pharmacy phone # [phone] ) and Other: [facility]    If a history was not reviewed directly with patient/caregiver please comment with the reason why: N/A     Antibiotic use in the last 90 days (3 months): NO      Missing Medication Identified  YES  Number of medications: 4  Indicate action taken: Updated Medication List ARICEPT, SYNTHROID, NAMENDA and PRAVASTATIN      Wrong Medication Identified NO   Number of medications: -  Indicate action taken: Updated Medication List -      Wrong Dose/Interval/Route Identified NO              Number of medications: -   Indicate action taken: Updated Medication List -        Is patient currently taking warfarin:  No        Medication Compliance Issues and/or Medication Concerns: N/A      Allergies: Patient has no known allergies.    Prior to Admission Medications:    Prior to Admission Medications   Prescriptions Last Dose Informant Patient Reported? Taking?   donepeziL (Aricept) 10 mg tablet   Yes Yes   Sig: Take 10 mg by mouth nightly.   levothyroxine (synthroid) 25 mcg tablet   Yes Yes   Sig: Take 25 mcg by mouth Daily (before breakfast).   memantine (Namenda) 10 mg tablet   Yes Yes   Sig: Take 10 mg by mouth two (2) times a day.   pravastatin (PravachoL) 80 mg tablet   Yes Yes   Sig: Take 80 mg by mouth daily.      Facility-Administered Medications: None       Alexis N. Lang, CPHT   Contact: [phone]

## 2020-12-15 NOTE — ED Provider Notes (Signed)
[place]  Emergency Department Treatment Report        Patient: Beverly Nguyen Age: 86 y.o. Sex: female    Date of Birth: Jul 10, 1935 Admit Date: 12/15/2020 PCP: None   MRN: [id number]  CSN: [id number]     Room: ER30/ER30 Time Dictated: 2:51 PM      Attending MD: Lennox Laity, MD  APC:  Maryanna Shape PA-C    Chief Complaint   Fainting    History of Present Illness   86 y.o. female brought from EMS for evaluation of a passing out spell.  Patient states she was told that she passed out but she does not remember anything.  She states she feels okay right now and has no complaints.  EMS told our charge nurse that the patient was found lethargic at home has not been eating and drinking well.  Unable to obtain further history as patient has dementia.    Review of Systems   Review of Systems   Unable to perform ROS: Dementia (denies pain, cough, headache, chest pain, abdominal pain, vomiting, diarrhea, shortness of breath)     Past Medical/Surgical History     Past Medical History:   Diagnosis Date   ??? Dementia ([place])    ??? High cholesterol    ??? Hypertension    ??? S/P hip replacement    ??? Thyroid disorder      History reviewed. No pertinent surgical history.    Obtained by phone from family member    Social History     Social History     Socioeconomic History   ??? Marital status: UNKNOWN     Spouse name: Not on file   ??? Number of children: Not on file   ??? Years of education: Not on file   ??? Highest education level: Not on file   Occupational History   ??? Not on file   Tobacco Use   ??? Smoking status: Former Smoker   ??? Smokeless tobacco: Never Used   ??? Tobacco comment: dementia   Substance and Sexual Activity   ??? Alcohol use: Never   ??? Drug use: Not on file   ??? Sexual activity: Not on file   Other Topics Concern   ??? Not on file   Social History Narrative   ??? Not on file     Social Determinants of Health     Financial Resource Strain:    ??? Difficulty of Paying Living Expenses: Not on file   Food Insecurity:    ???  Worried About Running Out of Food in the Last Year: Not on file   ??? Ran Out of Food in the Last Year: Not on file   Transportation Needs:    ??? Lack of Transportation (Medical): Not on file   ??? Lack of Transportation (Non-Medical): Not on file   Physical Activity:    ??? Days of Exercise per Week: Not on file   ??? Minutes of Exercise per Session: Not on file   Stress:    ??? Feeling of Stress : Not on file   Social Connections:    ??? Frequency of Communication with Friends and Family: Not on file   ??? Frequency of Social Gatherings with Friends and Family: Not on file   ??? Attends Religious Services: Not on file   ??? Active Member of Clubs or Organizations: Not on file   ??? Attends Club or Organization Meetings: Not on file   ??? Marital Status: Not on  file   Intimate Partner Violence:    ??? Fear of Current or Ex-Partner: Not on file   ??? Emotionally Abused: Not on file   ??? Physically Abused: Not on file   ??? Sexually Abused: Not on file   Housing Stability:    ??? Unable to Pay for Housing in the Last Year: Not on file   ??? Number of Places Lived in the Last Year: Not on file   ??? Unstable Housing in the Last Year: Not on file     Family History     Family History   Family history unknown: Yes     Current Medications     Unknown to patient    Patient's ex daughter-in-law states she knows she takes Environmental manager, baby ASA, synthroid    From pharmacy tech:  Prior to Admission Medications   Prescriptions Last Dose Informant Patient Reported? Taking?   donepeziL (Aricept) 10 mg tablet   Yes Yes   Sig: Take 10 mg by mouth nightly.   levothyroxine (synthroid) 25 mcg tablet   Yes Yes   Sig: Take 25 mcg by mouth Daily (before breakfast).   memantine (Namenda) 10 mg tablet   Yes Yes   Sig: Take 10 mg by mouth two (2) times a day.   pravastatin (PravachoL) 80 mg tablet   Yes Yes   Sig: Take 80 mg by mouth daily.      Facility-Administered Medications: None     Allergies     Allergies   Allergen Reactions   ??? Pcn [Penicillins] Hives       Physical  Exam     ED Triage Vitals [12/15/20 1426]   ED Encounter Vitals Group      BP (!) 180/122      Pulse (Heart Rate) 60      Resp Rate 16      Temp 97.7      Temp src       O2 Sat (%) 100 %      Weight       Height      Physical Exam  Constitutional:       Appearance: She is not ill-appearing, toxic-appearing or diaphoretic.   HENT:      Head: Normocephalic and atraumatic.      Mouth/Throat:      Pharynx: Oropharynx is clear.   Eyes:      Extraocular Movements: Extraocular movements intact.      Conjunctiva/sclera: Conjunctivae normal.   Cardiovascular:      Rate and Rhythm: Normal rate and regular rhythm.      Pulses: Normal pulses.   Pulmonary:      Effort: Pulmonary effort is normal.      Breath sounds: Normal breath sounds.   Abdominal:      General: Abdomen is flat.      Palpations: Abdomen is soft.      Tenderness: There is no abdominal tenderness. There is no guarding or rebound.   Musculoskeletal:         General: No tenderness or signs of injury. Normal range of motion.      Cervical back: Normal range of motion and neck supple.      Right lower leg: No edema.      Left lower leg: No edema.   Skin:     General: Skin is warm and dry.      Capillary Refill: Capillary refill takes less than 2 seconds.      Findings:  No rash.   Neurological:      Mental Status: She is alert.      GCS: GCS eye subscore is 4. GCS verbal subscore is 5. GCS motor subscore is 6.      Cranial Nerves: No dysarthria or facial asymmetry.      Sensory: Sensation is intact.      Motor: Motor function is intact.      Coordination: Coordination is intact.      Comments: Pleasantly demented   Psychiatric:         Mood and Affect: Mood normal.       Impression and Management Plan     Patient here after syncopal episode also with lethargy at home per caregiver.  Will attempt to contact family for further history as we have no records on this patient.  She is quite hypertensive.  Consider hypertensive urgency, consider intracranial bleed,  arrhythmia, infectious or metabolic etiology, anemia.    Diagnostic Studies   Lab:   Recent Results (from the past 12 hour(s))   CBC WITH AUTOMATED DIFF    Collection Time: 12/15/20  4:20 PM   Result Value Ref Range    WBC 8.1 4.0 - 11.0 1000/mm3    RBC 3.87 3.60 - 5.20 M/uL    HGB 11.0 (L) 13.0 - 17.2 gm/dl    HCT 34.2 (L) 37.0 - 50.0 %    MCV 88.4 80.0 - 98.0 fL    MCH 28.4 25.4 - 34.6 pg    MCHC 32.2 30.0 - 36.0 gm/dl    PLATELET 340 140 - 450 1000/mm3    MPV 9.2 6.0 - 10.0 fL    RDW-SD 43.9 36.4 - 46.3      NRBC 0 0 - 0      IMMATURE GRANULOCYTES 0.4 0.0 - 3.0 %    NEUTROPHILS 68.6 (H) 34 - 64 %    LYMPHOCYTES 21.1 (L) 28 - 48 %    MONOCYTES 7.9 1 - 13 %    EOSINOPHILS 1.6 0 - 5 %    BASOPHILS 0.4 0 - 3 %   METABOLIC PANEL, COMPREHENSIVE    Collection Time: 12/15/20  4:20 PM   Result Value Ref Range    Sodium 136 136 - 145 mEq/L    Potassium 4.5 3.5 - 5.1 mEq/L    Chloride 110 (H) 98 - 107 mEq/L    CO2 20 (L) 21 - 32 mEq/L    Glucose 101 74 - 106 mg/dl    BUN 33 (H) 7 - 25 mg/dl    Creatinine 1.5 (H) 0.6 - 1.3 mg/dl    GFR est AA 43.0      GFR est non-AA 35      Calcium 9.7 8.5 - 10.1 mg/dl    AST (SGOT) 14 (L) 15 - 37 U/L    ALT (SGPT) 13 12 - 78 U/L    Alk. phosphatase 121 (H) 45 - 117 U/L    Bilirubin, total 0.2 0.2 - 1.0 mg/dl    Protein, total 7.5 6.4 - 8.2 gm/dl    Albumin 2.4 (L) 3.4 - 5.0 gm/dl    Anion gap 6 5 - 15 mmol/L   TROPONIN-HIGH SENSITIVITY    Collection Time: 12/15/20  4:20 PM   Result Value Ref Range    Troponin-High Sensitivity 8 0 - 59 ng/L   T4, FREE    Collection Time: 12/15/20  4:20 PM   Result Value Ref Range    Free T4 1.36  0.76 - 1.46 ng/dl   TSH 3RD GENERATION    Collection Time: 12/15/20  4:20 PM   Result Value Ref Range    TSH 1.850 0.358 - 3.740 uIU/mL   MAGNESIUM    Collection Time: 12/15/20  4:20 PM   Result Value Ref Range    Magnesium 2.2 1.6 - 2.6 mg/dl   SARS-COV-2_FLU    Collection Time: 12/15/20  5:52 PM    Specimen: NASOPHARYNGEAL SWAB   Result Value Ref Range    COVID-19  NEGATIVE NEGATIVE      Influenza A PCR NEGATIVE NEGATIVE      Influenza B PCR NEGATIVE NEGATIVE     POC URINE MACROSCOPIC    Collection Time: 12/15/20  6:07 PM   Result Value Ref Range    Glucose Negative NEGATIVE,Negative mg/dl    Bilirubin Negative NEGATIVE,Negative      Ketone Negative NEGATIVE,Negative mg/dl    Specific gravity >=1.030 1.005 - 1.030      Blood Small (A) NEGATIVE,Negative      pH (UA) 5.5 5 - 9      Protein Trace (A) NEGATIVE,Negative mg/dl    Urobilinogen 1.0 0.0 - 1.0 EU/dl    Nitrites Negative NEGATIVE,Negative      Leukocyte Esterase Large (A) NEGATIVE,Negative      Color Yellow      Appearance Slightly Cloudy     POC URINE MICROSCOPIC    Collection Time: 12/15/20  6:07 PM   Result Value Ref Range    Epithelial cells, squamous 1-4 /LPF    WBC 30-49 /HPF    RBC 1-4 /HPF    Bacteria 2+ /HPF     Labs Reviewed   CBC WITH AUTOMATED DIFF - Abnormal; Notable for the following components:       Result Value    HGB 11.0 (*)     HCT 34.2 (*)     NEUTROPHILS 68.6 (*)     LYMPHOCYTES 21.1 (*)     All other components within normal limits   METABOLIC PANEL, COMPREHENSIVE - Abnormal; Notable for the following components:    Chloride 110 (*)     CO2 20 (*)     BUN 33 (*)     Creatinine 1.5 (*)     AST (SGOT) 14 (*)     Alk. phosphatase 121 (*)     Albumin 2.4 (*)     All other components within normal limits   POC URINE MACROSCOPIC - Abnormal; Notable for the following components:    Blood Small (*)     Protein Trace (*)     Leukocyte Esterase Large (*)     All other components within normal limits   SARS-COV-2_FLU   TROPONIN-HIGH SENSITIVITY   T4, FREE   TSH 3RD GENERATION   MAGNESIUM   PROTHROMBIN TIME + INR   PTT   POC URINE MICROSCOPIC       EKG interpretation by Lennox Laity, MD:  Sinus rhythm rate 64 no acute ST changes to suggest ischemia    Imaging:    XR CHEST SNGL V    Result Date: 12/15/2020  INDICATION: general weakness. Dyspnea COMPARISON: none TECHNIQUE: Single view chest.     IMPRESSION: Heart  size upper normal. Mild bibasilar atelectatic change. Otherwise, no focal infiltrates. Multiple left rib fractures, which may be subacute or chronic. Acute on chronic rib fractures not excluded. Please correlate clinically.     CT HEAD WO CONT    Result Date: 12/15/2020  INDICATION: syncope. Lethargy COMPARISON: none TECHNIQUE: Axial Head CT without IV contrast. Sagittal and coronal reconstructions were performed. All CT exams at this facility use one or more dose reduction techniques including automatic exposure control, mA/kV adjustment per patient's size, or iterative reconstruction technique. FINDINGS: There is age related cerebral atrophy with chronic small vessel ischemic changes in the white matter. Prominence of the extra-axial CSF spaces, left greater than right (approximately 5 mm maximal thickness in the left inferior frontal region). This likely reflects pronounced peripheral atrophy. Small chronic subdural hygroma could be another differential consideration. There is question of subtle subdural collection in the right peripheral temporal region with slight hyperdensity measuring 5 mm in maximal thickness. This is observed on image 22 series 6 and image. Similar changes seen in the contralateral left temporal region. This may reflect subtle subdural hematoma versus beam hardening artifact. Elsewhere, there is no mass, hemorrhage, acute ischemia, or hydrocephalus. Calvarium is intact.     IMPRESSION: 1.  Beam hardening artifact versus subtle subdural hematoma in the bilateral temporal region. 2.  Otherwise, no acute intracranial abnormalities. 3.  Prominent extra-axial CSF space bilateral frontal regions, left greater than right. This likely reflects peripheral atrophy. Small chronic subdural hygroma could be another differential consideration. Findings were reported to Dr Alisa Graff on 12/15/2020 4:08 PM EST.     ED Course     Patient was maintained on cardiac, oxygen, and blood pressure  monitoring.    Indication for monitoring: Syncope    My interpretation of patient's cardiac monitoring is sinus rhythm rate 63 5:16 PM     Patient Vitals for the past 12 hrs:   Temp Pulse Resp BP SpO2   12/15/20 1804 ??? 64 18 (!) 188/63 99 %   12/15/20 1702 ??? 60 20 (!) 192/62 100 %   12/15/20 1641 ??? 61 14 ??? 99 %   12/15/20 1622 97.7 ??F (36.5 ??C) ??? ??? ??? ???   12/15/20 1616 ??? ??? ??? (!) 186/66 90 %   12/15/20 1426 ??? 60 16 (!) 180/122 100 %       ED Course as of 12/15/20 2126   Mon Dec 15, 2020   1415 CT HEAD WO CONT  IMPRESSION:  1.  Beam hardening artifact versus subtle subdural hematoma in the bilateral  temporal region.   ??  2.  Otherwise, no acute intracranial abnormalities.   ??  3.  Prominent extra-axial CSF space bilateral frontal regions, left greater than  right. This likely reflects peripheral atrophy. Small chronic subdural hygroma  could be another differential consideration.  ??  Findings were reported to Dr Alisa Graff on 12/15/2020 4:08 PM EST. [EI]   0160 I spoke with Beverly Nguyen who answered this patient's home phone- she is the patient's ex-daughter-in-law.  States she was with a caregiver today and "fainted" on her.  Has not passed out before.  Has been confused for the past few days increased from her baseline dementia.  States she has Dementia, prosthetic hips, thyroid problems, hypertension and high cholesterol.  No known falls.  Patient's son is currently in rehab after a hospital stay so she has been helping her during the snowstorm. [EI]   1093 Chest x-ray was reviewed- Interpretation by myself = no pneumonia.  Healed rib fractures.  Patient re-examined, no chest wall tenderness of bruising. [EI]   1523 Discussed the patient's history, physical and plan with Dr. Alisa Graff who is in agreement.   [EI]   1600 Discussed with pharmacy tech to investigate  patient's home meds as I have not heard back from family. [EI]   1631 I added PT/PTT-- patient will need admission for re-imaging.  BP now 180/77. [EI]   1716 Due to  end of shift, at 5:16 PM the patient is being left in the care of Dr. Alisa Graff for follow-up of all pending laboratory studies and to arrange final disposition.   [EI]      ED Course User Index  [EI] Jenelle Mages, PA-C       Medications   sodium chloride (NS) flush 5-10 mL (10 mL IntraVENous Given 12/15/20 1442)   sodium chloride 0.9 % bolus infusion 500 mL (0 mL IntraVENous IV Completed 12/15/20 2030)   cefTRIAXone (ROCEPHIN) 1 g in 0.9% sodium chloride (MBP/ADV) 50 mL MBP (0 g IntraVENous IV Completed 12/15/20 2030)       Medical Decision Making     Patient presenting for a passing out spell for caregiver at home.  Patient is demented and cannot give any history.  She has no complaints for me.  Her work-up so far has found artifact versus bitemporal small subdural hematomas.  This will need to be monitored in hospital.  Plan currently is to admit.  Patient's laboratory work-up is pending.  Her EKG looks unremarkable.  She has been awake and pleasantly demented here without acute neurological deficit.  I, Lennox Laity, MD , have personally seen and examined this patient; I have fully participated in the care of this patient with the advanced practice provider.  I have reviewed and agree with all pertinent clinical information including history, physical exam, studies and the plan.  I have also reviewed and agree with the medications, allergies and past medical history sections for this patient.  Patient arrives here with syncope.  Has a subdural hematoma that is been reviewed by neurosurgeon Dr.Koen and our radiologist on CAT scan.  Patient denies any complaints right now.  Does have a urinary tract infection which Rocephin IV was given for.  Through the subdural hematoma is subtle and small but may progress and requires repeat CT tomorrow.  Patient was placed on the hospitalist service of Dr. Winona Legato for continuous monitoring, treatment of UTI and mental status changes and repeat CT tomorrow.  She does require stepdown  for careful monitoring of neuro checks. Critical care time excluding procedures, but including direct patient care, reviewing medical records, evaluating results of diagnostic testing, discussions with family members, and consulting with physicians:*45**minutes  Lennox Laity, MD  December 15, 2020      Final Diagnosis       ICD-10-CM ICD-9-CM   1. Altered mental status, unspecified altered mental status type  R41.82 780.97   2. Subdural hematoma (HCC)  S06.5X9A 432.1   3. Syncope, unspecified syncope type  R55 780.2   4. Acute cystitis without hematuria  N30.00 595.0       Disposition   Hospital admission stepdown    The patient was personally evaluated by myself and Emori Mumme, Pete Pelt, MD who agrees with the above assessment and plan.    Erin E. Gustavus Messing  December 15, 2020    My signature above authenticates this document and my orders, the final    diagnosis (es), discharge prescription (s), and instructions in the Epic    record.  If you have any questions please contact [phone].     Nursing notes have been reviewed by the physician/ advanced practice    Clinician.    [employer]  dictation software was used for portions of this report. Unintended voice recognition errors may occur.

## 2020-12-16 ENCOUNTER — Inpatient Hospital Stay: Admit: 2020-12-16 | Payer: MEDICARE

## 2020-12-16 LAB — METABOLIC PANEL, COMPREHENSIVE
ALT (SGPT): 11 U/L — ABNORMAL LOW (ref 12–78)
AST (SGOT): 12 U/L — ABNORMAL LOW (ref 15–37)
Albumin: 2.3 gm/dl — ABNORMAL LOW (ref 3.4–5.0)
Alk. phosphatase: 110 U/L (ref 45–117)
Anion gap: 7 mmol/L (ref 5–15)
BUN: 28 mg/dl — ABNORMAL HIGH (ref 7–25)
Bilirubin, total: 0.3 mg/dl (ref 0.2–1.0)
CO2: 19 mEq/L — ABNORMAL LOW (ref 21–32)
Calcium: 9 mg/dl (ref 8.5–10.1)
Chloride: 109 mEq/L — ABNORMAL HIGH (ref 98–107)
Creatinine: 1.3 mg/dl (ref 0.6–1.3)
GFR est AA: 50
GFR est non-AA: 42
Glucose: 91 mg/dl (ref 74–106)
Potassium: 3.8 mEq/L (ref 3.5–5.1)
Protein, total: 7.1 gm/dl (ref 6.4–8.2)
Sodium: 134 mEq/L — ABNORMAL LOW (ref 136–145)

## 2020-12-16 LAB — CBC WITH AUTOMATED DIFF
BASOPHILS: 0.3 % (ref 0–3)
EOSINOPHILS: 2.2 % (ref 0–5)
HCT: 29.8 % — ABNORMAL LOW (ref 37.0–50.0)
HGB: 9.7 gm/dl — ABNORMAL LOW (ref 13.0–17.2)
IMMATURE GRANULOCYTES: 0.2 % (ref 0.0–3.0)
LYMPHOCYTES: 22.4 % — ABNORMAL LOW (ref 28–48)
MCH: 28.3 pg (ref 25.4–34.6)
MCHC: 32.6 gm/dl (ref 30.0–36.0)
MCV: 86.9 fL (ref 80.0–98.0)
MONOCYTES: 9.1 % (ref 1–13)
MPV: 8.9 fL (ref 6.0–10.0)
NEUTROPHILS: 65.8 % — ABNORMAL HIGH (ref 34–64)
NRBC: 0 (ref 0–0)
PLATELET: 286 10*3/uL (ref 140–450)
RBC: 3.43 M/uL — ABNORMAL LOW (ref 3.60–5.20)
RDW-SD: 42.5 (ref 36.4–46.3)
WBC: 8.8 10*3/uL (ref 4.0–11.0)

## 2020-12-16 LAB — PROTHROMBIN TIME + INR
INR: 1.2 — ABNORMAL HIGH (ref 0.1–1.1)
Prothrombin time: 14 seconds — ABNORMAL HIGH (ref 10.2–12.9)

## 2020-12-16 LAB — PTT: aPTT: 33.1 seconds (ref 25.1–36.5)

## 2020-12-16 LAB — EKG, 12 LEAD, INITIAL
Atrial Rate: 64 {beats}/min
Calculated P Axis: 73 degrees
Calculated R Axis: 23 degrees
Calculated T Axis: 71 degrees
Diagnosis: NORMAL
P-R Interval: 162 ms
Q-T Interval: 454 ms
QRS Duration: 80 ms
QTC Calculation (Bezet): 468 ms
Ventricular Rate: 64 {beats}/min

## 2020-12-16 LAB — SARS-COV-2_FLU
COVID-19: NEGATIVE
Influenza A PCR: NEGATIVE
Influenza B PCR: NEGATIVE

## 2020-12-16 LAB — TROPONIN-HIGH SENSITIVITY
Troponin-High Sensitivity: 8 ng/L (ref 0–59)
Troponin-High Sensitivity: 7 ng/L (ref 0–59)

## 2020-12-16 LAB — PROTIME-INR
INR: 1.2 — ABNORMAL HIGH (ref 0.1–1.1)
Protime: 14 seconds — ABNORMAL HIGH (ref 10.2–12.9)

## 2020-12-16 LAB — COMPREHENSIVE METABOLIC PANEL
ALT: 11 U/L — ABNORMAL LOW (ref 12–78)
AST: 12 U/L — ABNORMAL LOW (ref 15–37)
Albumin: 2.3 gm/dl — ABNORMAL LOW (ref 3.4–5.0)
Alkaline Phosphatase: 110 U/L (ref 45–117)
Anion Gap: 7 mmol/L (ref 5–15)
BUN: 28 mg/dl — ABNORMAL HIGH (ref 7–25)
CO2: 19 mEq/L — ABNORMAL LOW (ref 21–32)
Calcium: 9 mg/dl (ref 8.5–10.1)
Chloride: 109 mEq/L — ABNORMAL HIGH (ref 98–107)
Creatinine: 1.3 mg/dl (ref 0.6–1.3)
GFR African American: 50
Glucose: 91 mg/dl (ref 74–106)
Potassium: 3.8 mEq/L (ref 3.5–5.1)
Sodium: 134 mEq/L — ABNORMAL LOW (ref 136–145)
Total Bilirubin: 0.3 mg/dl (ref 0.2–1.0)
Total Protein: 7.1 gm/dl (ref 6.4–8.2)
eGFR NON-AA: 42

## 2020-12-16 LAB — CBC WITH AUTO DIFFERENTIAL
Basophils %: 0.3 % (ref 0–3)
Eosinophils %: 2.2 % (ref 0–5)
Hematocrit: 29.8 % — ABNORMAL LOW (ref 37.0–50.0)
Hemoglobin: 9.7 gm/dl — ABNORMAL LOW (ref 13.0–17.2)
Immature Granulocytes %: 0.2 % (ref 0.0–3.0)
Lymphocytes %: 22.4 % — ABNORMAL LOW (ref 28–48)
MCH: 28.3 pg (ref 25.4–34.6)
MCHC: 32.6 gm/dl (ref 30.0–36.0)
MCV: 86.9 fL (ref 80.0–98.0)
MPV: 8.9 fL (ref 6.0–10.0)
Monocytes %: 9.1 % (ref 1–13)
Neutrophils %: 65.8 % — ABNORMAL HIGH (ref 34–64)
Nucleated RBCs: 0 (ref 0–0)
Platelets: 286 10*3/uL (ref 140–450)
RBC: 3.43 M/uL — ABNORMAL LOW (ref 3.60–5.20)
RDW-SD: 42.5 (ref 36.4–46.3)
WBC: 8.8 10*3/uL (ref 4.0–11.0)

## 2020-12-16 LAB — EKG 12-LEAD
Atrial Rate: 64 {beats}/min
Diagnosis: NORMAL
P Axis: 73 degrees
P-R Interval: 162 ms
Q-T Interval: 454 ms
QRS Duration: 80 ms
QTc Calculation (Bazett): 468 ms
R Axis: 23 degrees
T Axis: 71 degrees
Ventricular Rate: 64 {beats}/min

## 2020-12-16 LAB — TROPONIN, HIGH SENSITIVITY
Troponin, High Sensitivity: 8 ng/L (ref 0–59)
Troponin, High Sensitivity: 7 ng/L (ref 0–59)

## 2020-12-16 LAB — APTT: aPTT: 33.1 seconds (ref 25.1–36.5)

## 2020-12-16 MED ORDER — HYDROCODONE-ACETAMINOPHEN 5 MG-325 MG TAB
5-325 mg | ORAL | Status: DC | PRN
Start: 2020-12-16 — End: 2020-12-20
  Administered 2020-12-18 – 2020-12-19 (×5): via ORAL

## 2020-12-16 MED ORDER — METOPROLOL TARTRATE 25 MG TAB
25 mg | Freq: Two times a day (BID) | ORAL | Status: DC
Start: 2020-12-16 — End: 2020-12-18
  Administered 2020-12-17 – 2020-12-18 (×4): via ORAL

## 2020-12-16 MED ORDER — ACETAMINOPHEN 325 MG TABLET
325 mg | Freq: Four times a day (QID) | ORAL | Status: DC | PRN
Start: 2020-12-16 — End: 2020-12-20

## 2020-12-16 MED ORDER — PRAVASTATIN 40 MG TAB
40 mg | Freq: Every day | ORAL | Status: DC
Start: 2020-12-16 — End: 2020-12-20
  Administered 2020-12-16 – 2020-12-19 (×4): via ORAL

## 2020-12-16 MED ORDER — AMLODIPINE 5 MG TAB
5 mg | Freq: Every day | ORAL | Status: DC
Start: 2020-12-16 — End: 2020-12-18
  Administered 2020-12-16 – 2020-12-18 (×3): via ORAL

## 2020-12-16 MED ORDER — SODIUM CHLORIDE 0.9 % IV
INTRAVENOUS | Status: AC
Start: 2020-12-16 — End: 2020-12-16
  Administered 2020-12-16: 06:00:00 via INTRAVENOUS

## 2020-12-16 MED ORDER — ALBUTEROL SULFATE 0.083 % (0.83 MG/ML) SOLN FOR INHALATION
2.5 mg /3 mL (0.083 %) | Freq: Four times a day (QID) | RESPIRATORY_TRACT | Status: DC | PRN
Start: 2020-12-16 — End: 2020-12-20

## 2020-12-16 MED ORDER — ALUM-MAG HYDROXIDE-SIMETH 200 MG-200 MG-20 MG/5 ML ORAL SUSP
200-200-20 mg/5 mL | ORAL | Status: DC | PRN
Start: 2020-12-16 — End: 2020-12-20

## 2020-12-16 MED ORDER — SODIUM CHLORIDE 0.9 % IJ SYRG
Freq: Three times a day (TID) | INTRAMUSCULAR | Status: DC
Start: 2020-12-16 — End: 2020-12-20
  Administered 2020-12-16 – 2020-12-19 (×13): via INTRAVENOUS

## 2020-12-16 MED ORDER — LEVOTHYROXINE 25 MCG TAB
25 mcg | Freq: Every day | ORAL | Status: DC
Start: 2020-12-16 — End: 2020-12-20
  Administered 2020-12-16 – 2020-12-19 (×4): via ORAL

## 2020-12-16 MED ORDER — SODIUM CHLORIDE 0.9 % IV PIGGY BACK
1 gram | INTRAVENOUS | Status: DC
Start: 2020-12-16 — End: 2020-12-20
  Administered 2020-12-16 – 2020-12-19 (×4): via INTRAVENOUS

## 2020-12-16 MED ORDER — SODIUM CHLORIDE 0.9 % IJ SYRG
INTRAMUSCULAR | Status: DC | PRN
Start: 2020-12-16 — End: 2020-12-20

## 2020-12-16 MED ORDER — MEMANTINE 5 MG TAB
5 mg | Freq: Two times a day (BID) | ORAL | Status: DC
Start: 2020-12-16 — End: 2020-12-20
  Administered 2020-12-16 – 2020-12-19 (×7): via ORAL

## 2020-12-16 MED ORDER — NALOXONE 0.4 MG/ML INJECTION
0.4 mg/mL | INTRAMUSCULAR | Status: DC | PRN
Start: 2020-12-16 — End: 2020-12-20

## 2020-12-16 MED ORDER — DONEPEZIL 10 MG TAB
10 mg | Freq: Every evening | ORAL | Status: DC
Start: 2020-12-16 — End: 2020-12-20
  Administered 2020-12-16 – 2020-12-19 (×4): via ORAL

## 2020-12-16 MED FILL — AMLODIPINE 5 MG TAB: 5 mg | ORAL | Qty: 1

## 2020-12-16 MED FILL — LEVOTHYROXINE 50 MCG TAB: 50 mcg | ORAL | Qty: 1

## 2020-12-16 MED FILL — MEMANTINE 5 MG TAB: 5 mg | ORAL | Qty: 2

## 2020-12-16 MED FILL — PRAVASTATIN 40 MG TAB: 40 mg | ORAL | Qty: 2

## 2020-12-16 MED FILL — CEFTRIAXONE 1 GRAM SOLUTION FOR INJECTION: 1 gram | INTRAMUSCULAR | Qty: 1

## 2020-12-16 MED FILL — DONEPEZIL 10 MG TAB: 10 mg | ORAL | Qty: 1

## 2020-12-16 NOTE — Consults (Signed)
Consults by Ramiro Harvest, MD at 12/16/20 1048                Author: Ramiro Harvest, MD  Service: Neurosurgery  Author Type: Physician       Filed: 12/16/20 1108  Date of Service: 12/16/20 1048  Status: Addendum          Editor: Ramiro Harvest, MD (Physician)          Related Notes: Original Note by Ramiro Harvest, MD (Physician) filed at 12/16/20 [place] [[id number]] ordered by Lennox Laity, MD at 12/15/20 1918                                      NEUROSURGERY CONSULT NOTE          Patient: Beverly Nguyen  MRN: [id number]   SSN: [id number]          Date of Birth: [birth year]   Age: 86 y.o.   Sex: female         CONSULT DATE: 12/16/2020      REQUESTING PHYSICIAN:  No referring provider defined for this encounter. .ref      CONSULTING PHYSICAIN:  Ramiro Harvest, MD      REASON FOR CONSULT: possible SDH      HPI: This patient is a  86 y.o.  handed   female who presents with LOC, some dementia. Does not recall events. UTI.   CT with subtle possible bilateral tiny SDH        Patient Active Problem List        Diagnosis  Code         ?  Subdural hematoma (HCC)  Z30.8M5H             Allergies        Allergen  Reactions         ?  Pcn [Penicillins]  Hives             Past Medical History:        Diagnosis  Date         ?  Dementia ([place])       ?  High cholesterol       ?  Hypertension       ?  S/P hip replacement           ?  Thyroid disorder             History reviewed. No pertinent surgical history.        Social History          Socioeconomic History         ?  Marital status:  UNKNOWN              Spouse name:  Not on file         ?  Number of children:  Not on file     ?  Years of education:  Not on file     ?  Highest education level:  Not on file       Occupational History        ?  Not on file       Tobacco Use         ?  Smoking status:  Former Smoker     ?  Smokeless tobacco:  Never Used        ?  Tobacco comment: dementia       Substance and Sexual Activity          ?  Alcohol use:  Never     ?  Drug use:  Not on file     ?  Sexual activity:  Not on file        Other Topics  Concern        ?  Not on file       Social History Narrative        ?  Not on file          Social Determinants of Health          Financial Resource Strain:         ?  Difficulty of Paying Living Expenses: Not on file       Food Insecurity:         ?  Worried About Running Out of Food in the Last Year: Not on file     ?  Ran Out of Food in the Last Year: Not on file       Transportation Needs:         ?  Lack of Transportation (Medical): Not on file     ?  Lack of Transportation (Non-Medical): Not on file       Physical Activity:         ?  Days of Exercise per Week: Not on file     ?  Minutes of Exercise per Session: Not on file       Stress:         ?  Feeling of Stress : Not on file       Social Connections:         ?  Frequency of Communication with Friends and Family: Not on file     ?  Frequency of Social Gatherings with Friends and Family: Not on file     ?  Attends Religious Services: Not on file     ?  Active Member of Clubs or Organizations: Not on file     ?  Attends Archivist Meetings: Not on file     ?  Marital Status: Not on file       Intimate Partner Violence:         ?  Fear of Current or Ex-Partner: Not on file     ?  Emotionally Abused: Not on file     ?  Physically Abused: Not on file     ?  Sexually Abused: Not on file       Housing Stability:         ?  Unable to Pay for Housing in the Last Year: Not on file     ?  Number of Places Lived in the Last Year: Not on file        ?  Unstable Housing in the Last Year: Not on file             Family History       Family history unknown: Yes             Current Facility-Administered Medications          Medication  Dose  Route  Frequency           ?  amLODIPine (NORVASC) tablet 5 mg   5 mg  Oral  DAILY     ?  donepeziL (ARICEPT) tablet 10 mg   10 mg  Oral  QHS     ?  levothyroxine (SYNTHROID) tablet 25 mcg   25 mcg  Oral  ACB      ?  memantine (NAMENDA) tablet 10 mg   10 mg  Oral  BID     ?  pravastatin (PRAVACHOL) tablet 80 mg   80 mg  Oral  DAILY     ?  sodium chloride (NS) flush 5-10 mL   5-10 mL  IntraVENous  Q8H     ?  sodium chloride (NS) flush 5-10 mL   5-10 mL  IntraVENous  PRN     ?  naloxone (NARCAN) injection 0.1 mg   0.1 mg  IntraVENous  PRN     ?  acetaminophen (TYLENOL) tablet 650 mg   650 mg  Oral  Q6H PRN     ?  HYDROcodone-acetaminophen (NORCO) 5-325 mg per tablet 1 Tablet   1 Tablet  Oral  Q4H PRN     ?  alum-mag hydroxide-simeth (MYLANTA) oral suspension 30 mL   30 mL  Oral  Q4H PRN     ?  albuterol (PROVENTIL VENTOLIN) nebulizer solution 2.5 mg   2.5 mg  Nebulization  Q6H PRN     ?  0.9% sodium chloride infusion   75 mL/hr  IntraVENous  CONTINUOUS           ?  cefTRIAXone (ROCEPHIN) 1 g in 0.9% sodium chloride (MBP/ADV) 50 mL MBP   1 g  IntraVENous  Q24H          Current Outpatient Medications        Medication  Sig         ?  ibandronate (Boniva) 150 mg tablet  Take 150 mg by mouth every thirty (30) days.     ?  citalopram (CELEXA) 20 mg tablet  Take 20 mg by mouth daily.     ?  megestroL (MEGACE) 400 mg/10 mL (40 mg/mL) suspension  Take 200 mg by mouth daily.     ?  metoprolol tartrate (LOPRESSOR) 50 mg tablet  Take 50 mg by mouth daily.     ?  donepeziL (Aricept) 10 mg tablet  Take 10 mg by mouth nightly.     ?  levothyroxine (synthroid) 25 mcg tablet  Take 25 mcg by mouth Daily (before breakfast).     ?  memantine (Namenda) 10 mg tablet  Take 10 mg by mouth two (2) times a day.         ?  pravastatin (PravachoL) 80 mg tablet  Take 80 mg by mouth daily.           EXAM:        Visit Vitals      BP  (!) 162/63     Pulse  100     Temp  97.7 ??F (36.5 ??C)     Resp  20     Ht  5' 1"  (1.549 m)     Wt  54.4 kg (120 lb)     SpO2  99%        BMI  22.67 kg/m??              Labs Reviewed:     Recent Results (from the past 24 hour(s))     CBC WITH AUTOMATED DIFF  Collection Time: 12/15/20  4:20 PM         Result  Value   Ref Range            WBC  8.1  4.0 - 11.0 1000/mm3       RBC  3.87  3.60 - 5.20 M/uL       HGB  11.0 (L)  13.0 - 17.2 gm/dl       HCT  34.2 (L)  37.0 - 50.0 %       MCV  88.4  80.0 - 98.0 fL       MCH  28.4  25.4 - 34.6 pg       MCHC  32.2  30.0 - 36.0 gm/dl       PLATELET  340  140 - 450 1000/mm3       MPV  9.2  6.0 - 10.0 fL       RDW-SD  43.9  36.4 - 46.3         NRBC  0  0 - 0         IMMATURE GRANULOCYTES  0.4  0.0 - 3.0 %       NEUTROPHILS  68.6 (H)  34 - 64 %       LYMPHOCYTES  21.1 (L)  28 - 48 %       MONOCYTES  7.9  1 - 13 %       EOSINOPHILS  1.6  0 - 5 %       BASOPHILS  0.4  0 - 3 %       METABOLIC PANEL, COMPREHENSIVE          Collection Time: 12/15/20  4:20 PM         Result  Value  Ref Range            Sodium  136  136 - 145 mEq/L       Potassium  4.5  3.5 - 5.1 mEq/L       Chloride  110 (H)  98 - 107 mEq/L       CO2  20 (L)  21 - 32 mEq/L       Glucose  101  74 - 106 mg/dl       BUN  33 (H)  7 - 25 mg/dl       Creatinine  1.5 (H)  0.6 - 1.3 mg/dl       GFR est AA  43.0          GFR est non-AA  35          Calcium  9.7  8.5 - 10.1 mg/dl       AST (SGOT)  14 (L)  15 - 37 U/L       ALT (SGPT)  13  12 - 78 U/L       Alk. phosphatase  121 (H)  45 - 117 U/L       Bilirubin, total  0.2  0.2 - 1.0 mg/dl       Protein, total  7.5  6.4 - 8.2 gm/dl       Albumin  2.4 (L)  3.4 - 5.0 gm/dl       Anion gap  6  5 - 15 mmol/L       TROPONIN-HIGH SENSITIVITY          Collection Time: 12/15/20  4:20 PM         Result  Value  Ref Range            Troponin-High Sensitivity  8  0 - 59 ng/L       T4, FREE          Collection Time: 12/15/20  4:20 PM         Result  Value  Ref Range            Free T4  1.36  0.76 - 1.46 ng/dl       TSH 3RD GENERATION          Collection Time: 12/15/20  4:20 PM         Result  Value  Ref Range            TSH  1.850  0.358 - 3.740 uIU/mL       MAGNESIUM          Collection Time: 12/15/20  4:20 PM         Result  Value  Ref Range            Magnesium  2.2  1.6 - 2.6 mg/dl       EKG, 12 LEAD,  INITIAL          Collection Time: 12/15/20  4:31 PM         Result  Value  Ref Range            Ventricular Rate  64  BPM       Atrial Rate  64  BPM       P-R Interval  162  ms       QRS Duration  80  ms       Q-T Interval  454  ms       QTC Calculation (Bezet)  468  ms       Calculated P Axis  73  degrees       Calculated R Axis  23  degrees       Calculated T Axis  71  degrees       Diagnosis                 Normal sinus rhythm   Normal ECG   No previous ECGs available   Confirmed by Massie Maroon, M.D., [facility] Shadow [phone]) on 12/16/2020 7:40:26 AM          [id number]          Collection Time: 12/15/20  5:52 PM       Specimen: NASOPHARYNGEAL SWAB         Result  Value  Ref Range            COVID-19  NEGATIVE  NEGATIVE         Influenza A PCR  NEGATIVE  NEGATIVE         Influenza B PCR  NEGATIVE  NEGATIVE         POC URINE MACROSCOPIC          Collection Time: 12/15/20  6:07 PM         Result  Value  Ref Range            Glucose  Negative  NEGATIVE,Negative mg/dl       Bilirubin  Negative  NEGATIVE,Negative         Ketone  Negative  NEGATIVE,Negative mg/dl       Specific gravity  >=1.030  1.005 - 1.030         Blood  Small (A)  NEGATIVE,Negative  pH (UA)  5.5  5 - 9         Protein  Trace (A)  NEGATIVE,Negative mg/dl       Urobilinogen  1.0  0.0 - 1.0 EU/dl       Nitrites  Negative  NEGATIVE,Negative         Leukocyte Esterase  Large (A)  NEGATIVE,Negative         Color  Yellow          Appearance  Slightly Cloudy          POC URINE MICROSCOPIC          Collection Time: 12/15/20  6:07 PM         Result  Value  Ref Range            Epithelial cells, squamous  1-4  /LPF       WBC  30-49  /HPF       RBC  1-4  /HPF       Bacteria  2+  /HPF       PROTHROMBIN TIME + INR          Collection Time: 12/16/20 12:35 AM         Result  Value  Ref Range            Prothrombin time  14.0 (H)  10.2 - 12.9 seconds       INR  1.2 (H)  0.1 - 1.1         PTT          Collection Time: 12/16/20 12:35 AM         Result  Value  Ref Range             aPTT  33.1  25.1 - 36.5 seconds       TROPONIN-HIGH SENSITIVITY          Collection Time: 12/16/20 12:35 AM         Result  Value  Ref Range            Troponin-High Sensitivity  8  0 - 59 ng/L       CBC WITH AUTOMATED DIFF          Collection Time: 12/16/20  4:25 AM         Result  Value  Ref Range            WBC  8.8  4.0 - 11.0 1000/mm3       RBC  3.43 (L)  3.60 - 5.20 M/uL       HGB  9.7 (L)  13.0 - 17.2 gm/dl       HCT  29.8 (L)  37.0 - 50.0 %       MCV  86.9  80.0 - 98.0 fL       MCH  28.3  25.4 - 34.6 pg       MCHC  32.6  30.0 - 36.0 gm/dl       PLATELET  286  140 - 450 1000/mm3       MPV  8.9  6.0 - 10.0 fL       RDW-SD  42.5  36.4 - 46.3         NRBC  0  0 - 0         IMMATURE GRANULOCYTES  0.2  0.0 - 3.0 %       NEUTROPHILS  65.8 (H)  34 - 64 %  LYMPHOCYTES  22.4 (L)  28 - 48 %       MONOCYTES  9.1  1 - 13 %       EOSINOPHILS  2.2  0 - 5 %       BASOPHILS  0.3  0 - 3 %       METABOLIC PANEL, COMPREHENSIVE          Collection Time: 12/16/20  4:25 AM         Result  Value  Ref Range            Sodium  134 (L)  136 - 145 mEq/L       Potassium  3.8  3.5 - 5.1 mEq/L       Chloride  109 (H)  98 - 107 mEq/L       CO2  19 (L)  21 - 32 mEq/L       Glucose  91  74 - 106 mg/dl       BUN  28 (H)  7 - 25 mg/dl       Creatinine  1.3  0.6 - 1.3 mg/dl       GFR est AA  50.0          GFR est non-AA  42          Calcium  9.0  8.5 - 10.1 mg/dl       AST (SGOT)  12 (L)  15 - 37 U/L       ALT (SGPT)  11 (L)  12 - 78 U/L       Alk. phosphatase  110  45 - 117 U/L       Bilirubin, total  0.3  0.2 - 1.0 mg/dl       Protein, total  7.1  6.4 - 8.2 gm/dl       Albumin  2.3 (L)  3.4 - 5.0 gm/dl       Anion gap  7  5 - 15 mmol/L       TROPONIN-HIGH SENSITIVITY          Collection Time: 12/16/20  4:25 AM         Result  Value  Ref Range            Troponin-High Sensitivity  7  0 - 59 ng/L           Mental Status and language function:   Alert, oriented, thought content appropriate  Affect and mood normal. Speech was wnl.   GCS:  E 4, V 5, M 6 = 15   Pupils- OD 4  mm, OS 4 mm  With some APD OS   Corneal Reflexes WNL   EOMI, Facial motion and sensation WNL, tongue midline      Motor exam:   5/5 for all except -   - pronator drift      Sensory exam:   Light touch and pin prick sensation wnl      Reflexes:      Cerbellar exam:            LABS:        CBC     Lab Results      Component  Value  Date/Time        WBC  8.8  12/16/2020 04:25 AM        RBC  3.43 (L)  12/16/2020 04:25 AM        HCT  29.8 (L)  12/16/2020  04:25 AM        MCV  86.9  12/16/2020 04:25 AM        MCH  28.3  12/16/2020 04:25 AM        MCHC  32.6  12/16/2020 04:25 AM               Basic Metabolic Profile     Lab Results      Component  Value  Date        NA  134 (L)  12/16/2020        CO2  19 (L)  12/16/2020        BUN  28 (H)  12/16/2020                   Coagulation     Lab Results      Component  Value  Date        INR  1.2 (H)  12/16/2020        APTT  33.1  12/16/2020                    STUDIES:   As above      IMPRESSION:    86 y.o.  female with possible subtle  SDH         PLAN:   Neuro checks Q4   Repeat CT this am         Ramiro Harvest, MD   12/16/2020    10:48 AM

## 2020-12-16 NOTE — ED Notes (Signed)
Pt resting and watching television at this time.

## 2020-12-16 NOTE — Progress Notes (Signed)
Rounded on Pt, Pt had no needs or concerns and did not want a pillow or blanket. Pt has no further needs or concerns at this time. RN notified.

## 2020-12-16 NOTE — ED Notes (Signed)
Pt assisted to bedside commode and back to bed. Tolerated well.

## 2020-12-16 NOTE — ED Notes (Signed)
Provided pt with dinner tray.

## 2020-12-16 NOTE — ED Notes (Signed)
Provided granddaughter Huston Foley with update on pt via phone.

## 2020-12-16 NOTE — ED Notes (Signed)
Rounded on pt  Medicated pt per orders  Pt resting comfortably in bed watching tv  No further needs at this time  Call bell within reach

## 2020-12-16 NOTE — Progress Notes (Signed)
Attempted to call POA Kem Parkinson without answer.   Attempted to call granddaughter Gearldine Bienenstock. Brandy answered phone and was given update about patient and her new hospital room. Admission questions were answered to the best of Brandy's ability. All questions answered from family. Will continue to monitor patient's status.

## 2020-12-16 NOTE — ED Notes (Signed)
Pt ambulated to the restroom with this RN, standby assist provided. Pt placed back on cardiac monitor, continuous pulse oximetry, and IVF. Will continue to monitor.

## 2020-12-16 NOTE — Progress Notes (Signed)
Progress Notes by Everlene Farrier, MD at 12/16/20 1036                Author: Everlene Farrier, MD  Service: Hospitalist  Author Type: Physician       Filed: 12/16/20 1407  Date of Service: 12/16/20 1036  Status: Signed          Editor: Everlene Farrier, MD (Physician)                          INTERNAL MEDICINE PROGRESS NOTE      Date of note:      December 16, 2020      Patient:               Beverly Nguyen,  86 y.o., female   Admit Date:        12/15/2020   Length of Stay:  1 day(s)      Problem List:      Patient Active Problem List        Diagnosis  Code         ?  Subdural hematoma (HCC)  S06.5X9A             Subjective + interval history        Patient is alert, follows commands, denies headache. She does remember that she passed out , but denies dehydration, fever, dysuria, shortness of breath        Assessment :        Altered mental status secondary to metabolic encephalopathy    Subdural hematoma   Syncope   UTI   Hypertension   Hyperlipidemia   Hypothyroidism   dementia   ??     Plan :        Continue neurochecks, repeat CT head today. Neurosurgery has been consulted      Ceftriaxone for UTI. Check urine culture ( discussed with microbiology lab and patient's RN)      BP elevated. Start amlodipine 5 mg daily. Continue metoprolol. Try to keep systolic BP < 073      Negative PT OT evaluation   Orthostatic vitals      DVT ppx : SCD      Discussed with patient's medical power of attorney daughter-in-law Peony Barner [phone].  Ms. Bethena Roys requests PT OT evaluation to see if patient would qualify to go to SNF on discharge.  She notes that patient is very deconditioned.      - Code status: Full code      Recommend to continue hospitalization.   Expected date of discharge: 1 to 2 days   Plan for disposition -  transfer to neuro telemetry floor if CT head looks stable.        Objective:                BP Readings from Last 1 Encounters:        12/16/20  (!) 162/63          Pulse Readings from Last 1  Encounters:        12/16/20  100          Resp Readings from Last 1 Encounters:        12/16/20  20          Temp Readings from Last 1 Encounters:        12/15/20  97.7 ??F (36.5 ??C)  Intake/Output Summary (Last 24 hours) at 12/16/2020 1036   Last data filed at 12/15/2020 2030     Gross per 24 hour        Intake  550 ml        Output  --        Net  550 ml             Physical Exam:        GEN - AAOx3, NAD   HEENT -  mucous membranes moist   Neck - supple, no JVD   Cardiac - RRR, S1, S2, no murmurs   Chest/Lungs - clear to auscultation without wheezes or rhonchi   Abdomen - soft, nontender   Extremities - no clubbing/ cyanosis/ edema   Neuro - CN 2-12 intact. No focal deficits. No motor or sensory deficit appreciated.  Frail   Skin - no rashes or lesions        Current medications:           Current Facility-Administered Medications:    ?  donepeziL (ARICEPT) tablet 10 mg, 10 mg, Oral, QHS, Charlaine Dalton, MD, 10 mg at 12/16/20 0126   ?  levothyroxine (SYNTHROID) tablet 25 mcg, 25 mcg, Oral, ACB, Charlaine Dalton, MD, 25 mcg at 12/16/20 0809   ?  memantine (NAMENDA) tablet 10 mg, 10 mg, Oral, BID, Charlaine Dalton, MD, 10 mg at 12/16/20 6948   ?  pravastatin (PRAVACHOL) tablet 80 mg, 80 mg, Oral, DAILY, Charlaine Dalton, MD, 80 mg at 12/16/20 0809   ?  sodium chloride (NS) flush 5-10 mL, 5-10 mL, IntraVENous, Q8H, Charlaine Dalton, MD, 10 mL at 12/16/20 0818   ?  sodium chloride (NS) flush 5-10 mL, 5-10 mL, IntraVENous, PRN, Charlaine Dalton, MD   ?  naloxone [facility]) injection 0.1 mg, 0.1 mg, IntraVENous, PRN, Charlaine Dalton, MD   ?  acetaminophen (TYLENOL) tablet 650 mg, 650 mg, Oral, Q6H PRN, Charlaine Dalton, MD   ?  HYDROcodone-acetaminophen (NORCO) 5-325 mg per tablet 1 Tablet, 1 Tablet, Oral, Q4H PRN, Charlaine Dalton, MD   ?  alum-mag hydroxide-simeth (MYLANTA) oral suspension 30 mL, 30 mL, Oral, Q4H PRN, Charlaine Dalton, MD   ?  albuterol (PROVENTIL VENTOLIN) nebulizer solution 2.5 mg, 2.5 mg,  Nebulization, Q6H PRN, Charlaine Dalton, MD   ?  0.9% sodium chloride infusion, 75 mL/hr, IntraVENous, CONTINUOUS, Charlaine Dalton, MD, Last Rate: 75 mL/hr at 12/16/20 0126, 75 mL/hr at 12/16/20 0126   ?  cefTRIAXone (ROCEPHIN) 1 g in 0.9% sodium chloride (MBP/ADV) 50 mL MBP, 1 g, IntraVENous, Q24H, Charlaine Dalton, MD, Last Rate: 100 mL/hr at 12/16/20 0809, 1 g at 12/16/20 0809      Current Outpatient Medications:    ?  ibandronate (Boniva) 150 mg tablet, Take 150 mg by mouth every thirty (30) days., Disp: , Rfl:    ?  citalopram (CELEXA) 20 mg tablet, Take 20 mg by mouth daily., Disp: , Rfl:    ?  megestroL (MEGACE) 400 mg/10 mL (40 mg/mL) suspension, Take 200 mg by mouth daily., Disp: , Rfl:    ?  metoprolol tartrate (LOPRESSOR) 50 mg tablet, Take 50 mg by mouth daily., Disp: , Rfl:    ?  donepeziL (Aricept) 10 mg tablet, Take 10 mg by mouth nightly., Disp: , Rfl:    ?  levothyroxine (synthroid) 25 mcg tablet, Take 25 mcg by mouth Daily (before breakfast)., Disp: , Rfl:    ?  memantine (Namenda) 10 mg tablet, Take 10 mg  by mouth two (2) times a day., Disp: , Rfl:    ?  pravastatin (PravachoL) 80 mg tablet, Take 80 mg by mouth daily., Disp: , Rfl:         Labs:          Recent Results (from the past 24 hour(s))     CBC WITH AUTOMATED DIFF          Collection Time: 12/15/20  4:20 PM         Result  Value  Ref Range            WBC  8.1  4.0 - 11.0 1000/mm3       RBC  3.87  3.60 - 5.20 M/uL       HGB  11.0 (L)  13.0 - 17.2 gm/dl       HCT  34.2 (L)  37.0 - 50.0 %       MCV  88.4  80.0 - 98.0 fL       MCH  28.4  25.4 - 34.6 pg       MCHC  32.2  30.0 - 36.0 gm/dl       PLATELET  340  140 - 450 1000/mm3       MPV  9.2  6.0 - 10.0 fL       RDW-SD  43.9  36.4 - 46.3         NRBC  0  0 - 0         IMMATURE GRANULOCYTES  0.4  0.0 - 3.0 %       NEUTROPHILS  68.6 (H)  34 - 64 %       LYMPHOCYTES  21.1 (L)  28 - 48 %       MONOCYTES  7.9  1 - 13 %       EOSINOPHILS  1.6  0 - 5 %       BASOPHILS  0.4  0 - 3 %       METABOLIC PANEL,  COMPREHENSIVE          Collection Time: 12/15/20  4:20 PM         Result  Value  Ref Range            Sodium  136  136 - 145 mEq/L       Potassium  4.5  3.5 - 5.1 mEq/L       Chloride  110 (H)  98 - 107 mEq/L       CO2  20 (L)  21 - 32 mEq/L       Glucose  101  74 - 106 mg/dl       BUN  33 (H)  7 - 25 mg/dl       Creatinine  1.5 (H)  0.6 - 1.3 mg/dl       GFR est AA  43.0          GFR est non-AA  35          Calcium  9.7  8.5 - 10.1 mg/dl       AST (SGOT)  14 (L)  15 - 37 U/L       ALT (SGPT)  13  12 - 78 U/L       Alk. phosphatase  121 (H)  45 - 117 U/L       Bilirubin, total  0.2  0.2 - 1.0 mg/dl       Protein, total  7.5  6.4 - 8.2 gm/dl       Albumin  2.4 (L)  3.4 - 5.0 gm/dl       Anion gap  6  5 - 15 mmol/L       TROPONIN-HIGH SENSITIVITY          Collection Time: 12/15/20  4:20 PM         Result  Value  Ref Range            Troponin-High Sensitivity  8  0 - 59 ng/L       T4, FREE          Collection Time: 12/15/20  4:20 PM         Result  Value  Ref Range            Free T4  1.36  0.76 - 1.46 ng/dl       TSH 3RD GENERATION          Collection Time: 12/15/20  4:20 PM         Result  Value  Ref Range            TSH  1.850  0.358 - 3.740 uIU/mL       MAGNESIUM          Collection Time: 12/15/20  4:20 PM         Result  Value  Ref Range            Magnesium  2.2  1.6 - 2.6 mg/dl       EKG, 12 LEAD, INITIAL          Collection Time: 12/15/20  4:31 PM         Result  Value  Ref Range            Ventricular Rate  64  BPM       Atrial Rate  64  BPM       P-R Interval  162  ms       QRS Duration  80  ms       Q-T Interval  454  ms       QTC Calculation (Bezet)  468  ms       Calculated P Axis  73  degrees       Calculated R Axis  23  degrees       Calculated T Axis  71  degrees       Diagnosis                 Normal sinus rhythm   Normal ECG   No previous ECGs available   Confirmed by Massie Maroon, M.D., Synetta Shadow [phone]) on 12/16/2020 7:40:26 AM          [id number]          Collection Time: 12/15/20  5:52 PM       Specimen:  NASOPHARYNGEAL SWAB         Result  Value  Ref Range            COVID-19  NEGATIVE  NEGATIVE         Influenza A PCR  NEGATIVE  NEGATIVE         Influenza B PCR  NEGATIVE  NEGATIVE         POC URINE MACROSCOPIC          Collection Time: 12/15/20  6:07 PM         Result  Value  Ref  Range            Glucose  Negative  NEGATIVE,Negative mg/dl       Bilirubin  Negative  NEGATIVE,Negative         Ketone  Negative  NEGATIVE,Negative mg/dl       Specific gravity  >=1.030  1.005 - 1.030         Blood  Small (A)  NEGATIVE,Negative         pH (UA)  5.5  5 - 9         Protein  Trace (A)  NEGATIVE,Negative mg/dl       Urobilinogen  1.0  0.0 - 1.0 EU/dl       Nitrites  Negative  NEGATIVE,Negative         Leukocyte Esterase  Large (A)  NEGATIVE,Negative         Color  Yellow          Appearance  Slightly Cloudy          POC URINE MICROSCOPIC          Collection Time: 12/15/20  6:07 PM         Result  Value  Ref Range            Epithelial cells, squamous  1-4  /LPF       WBC  30-49  /HPF       RBC  1-4  /HPF       Bacteria  2+  /HPF       PROTHROMBIN TIME + INR          Collection Time: 12/16/20 12:35 AM         Result  Value  Ref Range            Prothrombin time  14.0 (H)  10.2 - 12.9 seconds       INR  1.2 (H)  0.1 - 1.1         PTT          Collection Time: 12/16/20 12:35 AM         Result  Value  Ref Range            aPTT  33.1  25.1 - 36.5 seconds       TROPONIN-HIGH SENSITIVITY          Collection Time: 12/16/20 12:35 AM         Result  Value  Ref Range            Troponin-High Sensitivity  8  0 - 59 ng/L       CBC WITH AUTOMATED DIFF          Collection Time: 12/16/20  4:25 AM         Result  Value  Ref Range            WBC  8.8  4.0 - 11.0 1000/mm3       RBC  3.43 (L)  3.60 - 5.20 M/uL       HGB  9.7 (L)  13.0 - 17.2 gm/dl       HCT  29.8 (L)  37.0 - 50.0 %       MCV  86.9  80.0 - 98.0 fL       MCH  28.3  25.4 - 34.6 pg       MCHC  32.6  30.0 - 36.0 gm/dl       PLATELET  286  140 -  450 1000/mm3       MPV  8.9  6.0 - 10.0  fL       RDW-SD  42.5  36.4 - 46.3         NRBC  0  0 - 0         IMMATURE GRANULOCYTES  0.2  0.0 - 3.0 %       NEUTROPHILS  65.8 (H)  34 - 64 %       LYMPHOCYTES  22.4 (L)  28 - 48 %       MONOCYTES  9.1  1 - 13 %       EOSINOPHILS  2.2  0 - 5 %       BASOPHILS  0.3  0 - 3 %       METABOLIC PANEL, COMPREHENSIVE          Collection Time: 12/16/20  4:25 AM         Result  Value  Ref Range            Sodium  134 (L)  136 - 145 mEq/L       Potassium  3.8  3.5 - 5.1 mEq/L       Chloride  109 (H)  98 - 107 mEq/L       CO2  19 (L)  21 - 32 mEq/L       Glucose  91  74 - 106 mg/dl       BUN  28 (H)  7 - 25 mg/dl       Creatinine  1.3  0.6 - 1.3 mg/dl       GFR est AA  50.0          GFR est non-AA  42          Calcium  9.0  8.5 - 10.1 mg/dl       AST (SGOT)  12 (L)  15 - 37 U/L       ALT (SGPT)  11 (L)  12 - 78 U/L       Alk. phosphatase  110  45 - 117 U/L       Bilirubin, total  0.3  0.2 - 1.0 mg/dl       Protein, total  7.1  6.4 - 8.2 gm/dl       Albumin  2.3 (L)  3.4 - 5.0 gm/dl       Anion gap  7  5 - 15 mmol/L       TROPONIN-HIGH SENSITIVITY          Collection Time: 12/16/20  4:25 AM         Result  Value  Ref Range            Troponin-High Sensitivity  7  0 - 59 ng/L           XR Results:   Results from [place] encounter on 12/15/20      XR CHEST SNGL V      Narrative   INDICATION: general weakness. Dyspnea   COMPARISON: none   TECHNIQUE: Single view chest.      Impression   IMPRESSION:   Heart size upper normal. Mild bibasilar atelectatic change. Otherwise, no focal   infiltrates. Multiple left rib fractures, which may be subacute or chronic.   Acute on chronic rib fractures not excluded. Please correlate clinically.         CT Results:   Results from [place]  encounter on 12/15/20      CT HEAD WO CONT      Narrative   INDICATION: syncope. Lethargy   COMPARISON: none   TECHNIQUE: Axial Head CT without IV contrast. Sagittal and coronal   reconstructions were performed. All CT exams at this  facility use one or more   dose reduction techniques including automatic exposure control, mA/kV adjustment   per patient's size, or iterative reconstruction technique.      FINDINGS:   There is age related cerebral atrophy with chronic small vessel ischemic changes   in the white matter. Prominence of the extra-axial CSF spaces, left greater than   right (approximately 5 mm maximal thickness in the left inferior frontal   region). This likely reflects pronounced peripheral atrophy. Small chronic   subdural hygroma could be another differential consideration.      There is question of subtle subdural collection in the right peripheral temporal   region with slight hyperdensity measuring 5 mm in maximal thickness. This is   observed on image 22 series 6 and image. Similar changes seen in the   contralateral left temporal region. This may reflect subtle subdural hematoma   versus beam hardening artifact. Elsewhere, there is no mass, hemorrhage, acute   ischemia, or hydrocephalus. Calvarium is intact.      Impression   IMPRESSION:   1.  Beam hardening artifact versus subtle subdural hematoma in the bilateral   temporal region.      2.  Otherwise, no acute intracranial abnormalities.      3.  Prominent extra-axial CSF space bilateral frontal regions, left greater than   right. This likely reflects peripheral atrophy. Small chronic subdural hygroma   could be another differential consideration.      Findings were reported to Dr Alisa Graff on 12/15/2020 4:08 PM EST.         MRI Results:   No results found for this or any previous visit.         Nuclear Medicine Results:   No results found for this or any previous visit.         [place] Results:   No results found for this or any previous visit.         IR Results:   No results found for this or any previous visit.         VAS/US Results:   No results found for this or any previous visit.            Total clinical care time was 36  minutes of which more than 50% was spent in  coordination of care and counseling (time spent with patient/family face to face, physical exam, reviewing laboratory and imaging investigations, speaking with physicians and  nursing staff involved in this patient's care).       Perfecto Kingdom,  MD   [facility]   December 16, 2020   Time: 10:36 AM

## 2020-12-16 NOTE — Progress Notes (Signed)
Problem: Falls - Risk of  Goal: *Absence of Falls  Description: Document Beverly Nguyen Fall Risk and appropriate interventions in the flowsheet.  Outcome: Progressing Towards Goal  Note: Fall Risk Interventions:  Mobility Interventions: Assess mobility with egress test,Bed/chair exit alarm,OT consult for ADLs,PT Consult for mobility concerns,PT Consult for assist device competence    Mentation Interventions: Adequate sleep, hydration, pain control,Bed/chair exit alarm         Elimination Interventions: Bed/chair exit alarm,Call light in reach              Problem: Patient Education: Go to Patient Education Activity  Goal: Patient/Family Education  Outcome: Progressing Towards Goal

## 2020-12-16 NOTE — ED Notes (Signed)
Gave report to Lisa, RN.

## 2020-12-16 NOTE — ED Notes (Signed)
Report attempted 2x. Bedside RN busy at times.     Bedside report given for patient.     All belongings taken with patient    VSS

## 2020-12-16 NOTE — ED Notes (Signed)
Received report from Kerri, RN.

## 2020-12-16 NOTE — ED Notes (Signed)
Report given to Kerri, RN.

## 2020-12-17 LAB — CULTURE, URINE
CULTURE RESULT: 50000 — AB
Culture Result: 50000 — AB

## 2020-12-17 MED FILL — METOPROLOL TARTRATE 25 MG TAB: 25 mg | ORAL | Qty: 1

## 2020-12-17 MED FILL — MEMANTINE 5 MG TAB: 5 mg | ORAL | Qty: 2

## 2020-12-17 MED FILL — AMLODIPINE 5 MG TAB: 5 mg | ORAL | Qty: 1

## 2020-12-17 MED FILL — DONEPEZIL 10 MG TAB: 10 mg | ORAL | Qty: 1

## 2020-12-17 MED FILL — LEVOTHYROXINE 25 MCG TAB: 25 mcg | ORAL | Qty: 1

## 2020-12-17 MED FILL — CEFTRIAXONE 1 GRAM SOLUTION FOR INJECTION: 1 gram | INTRAMUSCULAR | Qty: 1

## 2020-12-17 MED FILL — PRAVASTATIN 40 MG TAB: 40 mg | ORAL | Qty: 2

## 2020-12-17 NOTE — Progress Notes (Signed)
 Progress Notes by Roselind Clent LABOR, PT at 12/17/20 1039                Author: Roselind Clent LABOR, PT  Service: Physical Therapy  Author Type: Physical Therapist       Filed: 12/17/20 1050  Date of Service: 12/17/20 1039  Status: Signed          Editor: Roselind Clent LABOR, PT (Physical Therapist)                  PHYSICAL THERAPY EVALUATION:                 Patient: Beverly Nguyen  (86 y.o. female)   Room: 3217/3217   [x]   Patient DOB Verified      Date of Admission: 12/15/2020    Length of Stay:  2 day(s)   Primary Diagnosis: Subdural hematoma (HCC) [S06.5X9A]         Insurance: Payor: [facility] / Plan: [facility] MEDICARE A & B / Product Type: Medicare /         Date: 12/17/2020   In time: 08:59  Out time:   09:24         Precautions: Falls.           Isolation:   There are currently no Active Isolations       MDRO: No active infections        Equipment: gait belt         Assessment:         Modified Rankin Score (mRS): 2 - Slight disability; unable to carry out all previous activities, but able to look after own affairs without  assistance       Based on the objective data described below, the patient presents with   - decreased independence with functional mobility    - impaired gait   - impaired balance   - impaired cognition    - generalized muscle weakness   - pt with difficulty seeing call bell, tele lead placed over nursing button for pt to be able to call nurse, pt with good return demo    - very  motivated to participate in PT to improve functional activity tolerance and return to prior level of function         Functional Status Score for the Intensive Care Unit (FSS-ICU)      Task   Score     1.  Rolling  6 - Use of bed rail or object to pull on     2. Supine to Sit Transfer  6 - Use of bed rail or another object to pull on for support     3. Sit to Stand Transfer  4 - Min assist (patient performs 75% or more of the work)     4. Sitting Edge of Bed  7 - Independent, maintains hands free     5. Walking  1  - Walks <50 feet with the assistance of 1 person OR requires the assistance of 2 people to assist with ambulation of any distance     TOTAL SCORE:  24 out of 35        INITIAL TOTAL SCORE:                Patients rehabilitation potential for below stated goals: Good.        Recommendations:   Recommend continued physical therapy during acute stay. Physical Therapy . Recommend out of bed activity to counteract ill  effects of bedrest, with assistance from staff as needed.   Discharge Recommendations: SNF vs  Home with Home Health PT and 24/7 supervision .   Further Equipment Recommendations for Discharge: TBD .          Plan:         Patient will benefit from skilled Physical Therapy intervention to address the above impairments to return to prior level of function. Patient will be seen 1-5 times/week for 1 week.      Patient will achieve PT goals in 1 week. Goals were created with  input from the  patient.        Physical Therapy Goals:        - Patient will be independent with bed mobility and will be modified independent  with transfers in preparation for OOB activities and ambulation.   - Patient will tolerate sitting up in chair for meals.   - Patient will ambulate with modified independence for 120 feet with the least restrictive device to promote functional independence at home.    - Patient will demonstrate good balance to safely enable upright activities and reduce risk for falls.   - Patient will demonstrate good activity tolerance during functional activities.   - Patient will demonstrate good safety awareness during functional activities.   - Patient will be supervision with lower extremity home exercise program to increase strength and endurance.           Planned interventions:         Skilled Physical Therapy services will provide functional mobility training, therapeutic exercises, therapeutic activities, patient/caregiver education as indicated.   Skilled Physical Therapy services will modify and  progress therapeutic interventions, address functional mobility deficits, address ROM and strength deficits, analyze and cue movement  patterns and assess and modify postural abnormalities to reach the stated goals.        Subjective:         Patient i'm visiting my son, I live in [place]      Patient reports 0/10 pain before treatment and 0/10 pain at conclusion of treatment.   Pain Location: nq        Objective Data Summary:         Orders, labs, and chart reviewed on Beverly Nguyen. Communicated with patient's nurse  (patient ok to be seen by PT).       Present illness history:      Patient Active Problem List           Diagnosis  Date Noted         ?  Subdural hematoma (HCC)  12/15/2020         Previous medical history:      Past Medical History:        Diagnosis  Date         ?  Dementia (HCC)       ?  High cholesterol       ?  Hypertension       ?  S/P hip replacement           ?  Thyroid disorder              Prior Level of Function/Home Situation:    Information was obtained by patient and unsure of accuracy, pt poor historian giving conflicting information during session    Per pt she lives in [place]  and is visiting her son in [place] (who also lives with her in [place]  but she is visiting him right  now). Her son is retired and is home during the day.    Per pt she ambulates without AD at baseline but occasionally uses RW, Indep ADLs/iADLs.        Per chart: pt lives with her son who works.          Patient found:        Semi reclined in bed. (+) bed alarm. (+) IV. (+) step down monitoring lines.      Patient received/participated in 15 minutes of treatment (bed mobility training, transfer training, therapeutic activities, patient education ) during/immediately following PT evaluation.        Cognitive Status:        Mental Status: Alert and oriented x 3 (person, place and year)    Communication: normal   Follows commands: intact   General Cognition: impaired, diminished situational awareness    Safety/Judgement: good safety awareness        Extremities Assessment:         Sensation:   Intact BLE      Strength:     RLE: 4/5   LLE: 4/5      Range Of Motion:   RLE:WFL   LLE: [facility]        Therapeutic Activities; Functional Mobility and Balance Status:         Focus on functional mobility training, bed mobility training, transfer training. Provided cueing for hand placement, technique, and safety.             Bed mobility:      Functional Status     Comments         Supine to sit  SBA  HOB elevated, use of side rails, increased time to perform      Sit to supine  DNT           Scooting  DNT                 Transfers:      Functional Status     Comments         Sit to stand  Min A        Stand to sit  Min A            bed to bedside chair  Min A   Hand held assist,             Ambulation/Gait Training:   DNT d/t decrease in BP with transfer                     Functional Balance  Sitting            Static sitting    good         Dynamic sitting    good            Functional Balance Standing           Static standing    fair         Dynamic standing    fair             Therapeutic Exercises:            Therapeutic exercises   performed a few reps of each:    ankle pumps, heel slides in supine, hip ab/duction in supine, straight leg raise             Activity Tolerance:        - good tolerance to activity during PT  session   - motivated to increase activity    - BP supine: 141/54; BP sitting EOB: 130/51 ; BP sitting in chair: 123/42. Nursing informed of Bps         Final Location:        Seated in bed side chair, all needs within reach. Patient agrees to call for assistance. (+) chair alarm. (+) nurse notified.        Communication/Education:        Education: benefits of activity, OOB to chair for meals and mobilize as tolerated, activity as tolerated to counteract ill effects of bedrest,  promote healing and return to prior level of function, call staff for assistance, safety, disposition, role of PT, PT plan of  care.      Education provided to: patient    Opportunity for questions and clarification was provided.      Readiness to learn indicated by: demonstrated understanding, verbalized understanding and needs reinforcement      Barriers to learning/limitations:  Yes;  cognitive      Comprehension: Patient communicated comprehension         Thank you for this referral.   Clent DELENA Leys, PT, DPT

## 2020-12-17 NOTE — Progress Notes (Signed)
 NUTRITION RECOMMENDATIONS:   . Continue regular diet as tolerated. Encourage PO intake.  . Order Ensure Compact (each 4 oz bottle provides 220 kcal, 9g protein) BID.  SABRA Monitor lytes and replete prn.  . Wts 3x week to trend.     . Discharge Recommendations: Regular    NUTRITION INITIAL EVALUATION    NUTRITION ASSESSMENT:       Reason for Assessment: MST    Admitting Diagnosis:   Subdural hematoma (HCC) [S06.5X9A]     PMH:   Past Medical History:   Diagnosis Date   . Dementia (HCC)    . High cholesterol    . Hypertension    . S/P hip replacement    . Thyroid disorder      Current Pertinent Medications: rocephin, synthroid (FDI)    Pertinent Labs: 2/1: Na 134 L    Anthropometrics & Physical Assessment:   Height:   Ht Readings from Last 3 Encounters:   12/15/20 5' 1 (1.549 m)      Weight:   Wt Readings from Last 3 Encounters:   12/17/20 60.8 kg (134 lb 0.6 oz)         BMI: Body mass index is 25.33 kg/m.    IBW: Ideal body weight: 47.8 kg (105 lb 6.1 oz)           UBW: Unable to obtain; son unsure    Wt Change: Will continue to monitor wt trends in-house         []  significant       []  not significant []  stable         []  intended         []  not intended    GI Symptoms/Issues:      Last Bowel Movement Date:  (pta)     Abdominal Assessment: Flat,Intact  Bowel Sounds: Active     Chewing/Swallowing Issues: none reported  Skin integrity: intact    Physical Assessment:   Muscle Wasting:  . Temporal: none (visual)  . Clavicle: none (visual)  . UTA Fat Wasting:  . Suborbital: none (visual)              Fluid Accumulation: none    Diet and Intake History:    Current Diet Order:    ADULT DIET Regular     Food Allergies: none     Diet/Intake History: Per H&P: Patient was found lethargic at home not eating and drinking well. Pt asleep, did not wake w/ verbal stimuli. Limited conversation with son, states she was doing okay, I just want her home         []  <50% intake x >5 days         []  <50% intake x >1 month         []   <75% intake x >7 days         []  <75% intake x 1 month         []  <75% intake x 3 months     Current Appetite/PO Intake:   No data recorded     Assessment of Current MNT: Diet seems appropriate, adequate to meet nutrition needs if PO intake >75% with meals. Oral supplements to increase protein-calorie intake opportunity.     Estimated Daily Nutrition Intake Needs: ABW 60.8 kg   1520 - 1824 kcals (25-30 kcal/kg)   61 - 79 g protein (1-1.3 g/kg)   1520 - 1824 mL fluid (25-30 ml/kg or per MD)    NUTRITION DIAGNOSIS:  1. Suspected suboptimal PO intake related to advanced age vs dementia as evidenced by not eating and drinking well per H&P.    NUTRITION INTERVENTION / RECOMMENDATIONS:     . Continue regular diet as tolerated. Encourage PO intake.  . Order Ensure Compact (each 4 oz bottle provides 220 kcal, 9g protein) BID.  SABRA Monitor lytes and replete prn.  . Wts 3x week to trend.     . Discharge Recommendations: Regular    NUTRITION MONITORING AND EVALUATION:     Nutrition Level of Care:  [x]  Low       []  Moderate      []  High    Monitoring and Evaluation: PO intake, diet tolerance and compliance, wt trend, nutrition-related labs, hydration, s/sx of new skin concerns, medical changes; will follow up per policy.     Nutrition Goals: Pt to meet/tolerate >75% of estimated needs, maintain weight throughout LOS, BG control <180/nutrition-related labs WNL, maintain/improvement of skin integrity.     Duwaine Levin, MS, RD, CNSC  12/17/20  Pager # [phone]

## 2020-12-17 NOTE — Progress Notes (Signed)
 DC PLAN: Per PT eval SNF vs Home with Home Health PT and 24/7 supervision    Patient reports she lives in [place] but is here currently staying with her son Ishana Blades     Patient admitted on 12/15/2020 from  HOME with   Chief Complaint   Patient presents with   . Syncope          The patient is being treated for    PMH:   Past Medical History:   Diagnosis Date   . Dementia (HCC)    . High cholesterol    . Hypertension    . S/P hip replacement    . Thyroid disorder         Treatment Team: Treatment Team: Attending Provider: Onita Pill, MD; Consulting Provider: Lonnie Passy, MD; Consulting Provider: Murl Fontaine RAMAN, MD; Physical Therapist: Roselind Clent LABOR, PT; Occupational Therapist: Pleas Lyle HERO, OT; Staff Nurse: Rayann Adelita HERO, LPN      The patient has been admitted to the hospital  0 times in the past 12 months.    Patient and Family/Caregivers Goals of Care:  GET BETTER/STRONGER    Caregivers Participating in Plan of Care/Discharge Plan with the patient: PATIENT  LEFT VM FOR SON GREG [phone]    Tentative dc plan: SNF VS HOME WITH HOME HEALTH PT/OT    Anticipated DME needs for discharge:  TBD    PRESCREENING COMPLETED FOR SNF  Y  Will patient qualify for medicaid now or in the next 180 days?  Patient reports she lives in [place] but is currently staying with son. Will not qualify for [place] Medicaid.    Has patient had covid vaccine? N      Does patient have an ACP? Y     Does the patient have appropriate clothing available to be worn at discharge?  Y      The patient and care participants are willing to travel   area for discharge facility.  The patient and plan of care participants have been provided with a list of all available Rehab Facilities or Home Health agencies as applicable. CM will follow up with a list of facilities or agencies that are offer acceptance.    CM has disclosed any financial interest that [facility] may have with any facility or agency.    Anticipated Discharge Date:   12/26/20    Barriers to Healthcare Success/ Readmission Risk Factors:   Altered mental status secondary to metabolic encephalopathy   Subdural hematoma  Syncope  UTI  Hypertension  Hyperlipidemia  Hypothyroidism    Consults:  Palliative Care Consult Recommended: N  Transitional Care Clinic Referral: N  Transitional Nurse Navigator Referral: N  Oncology Navigator Referral: N  SW consulted: N  [facility] Consulted: N  Outside [employer] Referrals and Collaboration: N    Food/Nutrition Needs:   N                   Dietician Consulted: N    RRAT Score: Low Risk            5 Total Score    5 Pt. Coverage (Medicare=5 , Medicaid, or Self-Pay=4)        Criteria that do not apply:    Has Seen PCP in Last 6 Months (Yes=3, No=0)    Married. Living with Significant Other. Assisted Living. LTAC. SNF. or   Rehab    Patient Length of Stay (>5 days = 3)  IP Visits Last 12 Months (1-3=4, 4=9, >4=11)    Charlson Comorbidity Score (Age + Comorbid Conditions)           PCP: None . How do you get to your doctor appointment   Per patient she has PCP in [place], cannot remember name at this time    Specialists: N    Dialysis Unit: N    Pharmacy:   name  [facility]   Location of pharmacy   BY MAIL  Are there any medications that you have trouble paying forN   Any difficulty getting your medication N    DME available at Home: [facility]    Home O2 L Flow:  N            Home O2 Provider: N    Home Environment and Prior Level of Function: Lives at [address] [place] [address] @HOMEPHONE @. Lives with SON   How many stories is home  1   Steps into home 4  Responsibilities at home include SELF CARE    Prior to admission open services: N    Home Health AgencyN  Personal Care AgencyN    Extended Emergency Contact Information  Primary Emergency Contact: Samone, Guhl  Mobile Phone: [phone]  Relation: Child  Hearing or visual needs: NONE  Other needs: NONE  Preferred  language: ENGLISH  Interpreter needed? No  Secondary Emergency Contact: Avion, Patella  Mobile Phone: [phone]  Relation: Grandchild  Preferred language: ENGLISH  Interpreter needed? No     Transportation: TBD based on disposition    Therapy Recommendations:    OT = Y     PT = Y    SLP =  N     RT Home O2 Evaluation =  N    Wound Care =  N    Case Management Assessment    ABUSE/NEGLECT SCREENING   Physical Abuse/Neglect: Denies   Sexual Abuse: Denies   Sexual Abuse: Denies   Other Abuse/Issues: Denies          PRIMARY DECISION MAKER                                   CARE MANAGEMENT INTERVENTIONS                   Mode of Transport at Discharge: Other (see comment) (SON)       Transition of Care Consult (CM Consult): Home Health                   Physical Therapy Consult: Yes   Occupational Therapy Consult: Yes           Reason for Referral: DCP Rounds   History Provided By: Patient   Patient Orientation: Alert and Oriented,Person,Place,Situation   Cognition: Alert       Previous Living Arrangement: Lives with Family Independent (STAYING WITH SON, GREG)   Home Accessibility: Steps (1 LEVEL, 4 STEPS TO ENTRANCE)   Prior Functional Level: Independent in ADLs/IADLs,Bathing,Dressing,Toileting   Current Functional Level: Assistance with the following:,Mobility       Can patient return to prior living arrangement: Yes   Ability to make needs known:: Good   Family able to assist with home care needs:: Yes               Types of Needs Identified: Disease Management Education,Treatment Education   Anticipated Discharge  Needs: Home Health Services,Skilled Nursing Facility   Confirm Follow Up Transport: Family                  DISCHARGE LOCATION

## 2020-12-17 NOTE — Progress Notes (Signed)
Report called and given to [facility]. All questions answered and SBAR used. Will send patient via wheelchair to unit.

## 2020-12-17 NOTE — Progress Notes (Signed)
OCCUPATIONAL THERAPY EVALUATION     Patient: Beverly Nguyen (86 y.o. female)  Room: 3217/3217  Primary Diagnosis: Subdural hematoma (HCC) [S06.5X9A]       Date: 12/17/2020  In time:  1015        Out time:  1054  Total Time: 39 minutes  Evaluation Time: 15 minutes  Treatment Time: 24 minutes    Isolation:  There are currently no Active Isolations       MDRO: No active infections  Precautions: Falls.  Weight bearing precautions: None    Orders, labs, and chart reviewed. Communicated with nursing staff. Patient cleared to participate.    ASSESSMENT:      Patient was agreeable to occupational therapy evaluation this date. Pt motivated to work with therapy to return to [facility] and return home. Pt limited by orthostatic hypotension this date however asymptomatic. BP in sitting: 114/51; BP in standing x1 trial: 91/49; BP in standing x2 trial: 77/42; BP following sitting: 108/42. Nursing notified, present during final two readings. Pt would benefit from rehab to increase balance, strength, stability, activity tolerance, and independence with ADL's.     Based on the objective data described below, the patient presents with   - generalized muscle weakness affecting function in ADLs  - decreased functional standing balance    - decreased functional mobility   - unsteady in functional mobility, transfers and standing  - decreased standing tolerance  - decreased tolerance to sustained activity  - decreased cognition   - decreased safety awareness affecting patient's ability to safely and independently perform basic ADLs/IADLs.    Patient will benefit from skilled occupational therapy intervention to address the above impairments.    Patient's rehabilitation potential is considered to be Good.     Modified Rankin Score (mRS): 3 - Moderate disability; requires some help but able to walk without (human) assistance - cane, walker etc.    PLAN :  Planned Interventions: Adaptive equipment, ADI training, activity tolerance, functional  balance training, functional mobility training, therapeutic exercise, therapeutic activity, patient/caregiver education and training and energy conservation.  Frequency/Duration: Patient to be seen 1-5x/week x 2 weeks.  Discharge Recommendations: Skilled nursing facility verses Home Health OT/PT , pending progress, Would benefit to improve independence in ADLs, strength, activity tolerance and balance to ensure a successful and sustainable return to home., 24 hour supervision  Further Equipment Recommendations for Discharge: to be determined     Patient and/or family have participated as able in goal setting and plan of care.    PRIOR LEVEL OF FUNCTION:     Information was obtained by:  patient, patient is questionable historian, chart review  Home environment: Patient lives with son in a 1 story house   Patient's bathroom has tub/shower combo, standard toilet, shower chair, grab bars by toilet and grab bars in shower.    Comment: no steps to enter     Prior level of function:Patient is independent with no limitations.  Prior level of Instrumental Activities of Daily Living: Patient is independent with all IADLs.   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   Patient actively drives per pt report  Home equipment: shower chair, grab bars    EDUCATION:     Barriers to Learning/Limitations: yes;  cognitive  Education provided: Patient, Role of OT, Benefit of activity while hospitalized, Call for assistance, Staff assistance with mobility, Sit out of bed for 45-60 minutes or as tolerated, ADL training, Energy conservation, Safety, Functional mobility, Demonstrates adequately, Verbalized understanding,  Reinforce needed and Verbal.  Educational Handouts issued: NONE.    SUBJECTIVE:   Patient reported, "I am feeling good!"  --------------------------------------------------------------------------------------------------  Pain Assessment: None observed and None reported  Pain Location:  N/A    OBJECTIVE DATA  SUMMARY:     Patient found Bedside chair, Telemetry, ICU/stepdown equip, IV and Chair alarm.    Cognition     Mental Status: Oriented to, person, place, pleasant and required cueing for month and year.  Communication: normal.  Attention span: good(>70min).  Follows commands: intact.  General Cognition: impaired safety.  Hearing: grossly intact.  Vision:  glasses.    Activities of Daily Living    Eating: modified independent and increased time.  Grooming: supervision, min. assist, set-up, increased time and chair level.  UB Bathing: standby assist.  LB Bathing: standby assist.  UB Dressing: standby assist.  LB Dressing: standby assist  Toileting: standby assist.    ADL Comment:  Pt required supervision this date for grooming tasks such as washing face with wash cloth at bedside chair. Required min A for assist with washing underarms at bedside chair. Pt required SBA for LB dressing this date at chair level demonstrating good balance and control during tasks. Based on clinical assessment, pt requires mod-I for eating with increased time, SBA for UB/LB bathing/dressing, and SBA for toileting. Pt limited by orthostatic hypotension this date.     Mobility    Rolling: no tested .  Supine to sit: not tested.  Sit to Supine: not tested.  Sit to Stand: min. assist.  Functional transfers: standing to chair with  min. assist.    Mobility Comment: Pt wore a gait belt during all mobility this date. Pt required min A for sit <> stand at chair level. Pt required CGA for taking 4 steps rotating towards sink and 4 steps rotating back to chair. No LOB noted this date. No AD utilized.     Functional Balance    Static Sitting Balance: good.  Dynamic Sitting Balance: fair.  Static Standing Balance: fair-.  Dynamic Standing Balance: fair-.  Activity Tolerance: requires rest breaks.    Balance Comment: Pt demonstrated control and balance during session this date. No LOB noted.     Upper Extremity Function    Right ROM/strength:  AROM, WFL  and 3+/5.  Left ROM/strength: AROM, WFL and 3+/5  Right UE: no c/o paresthesia.  Left UE : no c/o paresthesia.  Dominance: right  Affected extremity: none    UE Function comments: Pt is WFL's for BUE's ROM however noted decreased strength in shoulder flexion. However still functional for grooming tasks.     Final Location: bedside chair, chair alarm, all needs close, agrees to call for assistance and nurse notified .       Myrtis Ser Index of Independence in Activities of Daily Living    Activities  Points (1 or 0) Independence  (1 point)  NO supervision, direction, or personal assistance Dependence   (0 points)  WITH supervision, direction, personal assistance, or total care   Bathing []  (1 POINT) Bathes self completely or needs help in bathing only a single part of the body such as the back, genital area, or disabled extremity [x]  (0 POINTS) Need help with bathing more than one part of the body, getting in or out of the tub or showering. Requires total bathing.    Dressing []  (1 POINT) Get clothes from  closets and drawers and puts on clothes and outer garments complete with fasteners. May have  help tying shoes.  [x]  (0 POINTS) Needs help with dressing self or needs to be completely dressed.    Toileting []  (1 POINT) Goes to toileting, gets on and off, arranges clothes, cleans genital area without help  [x]  Needs help transferring to the toileting, cleaning self or uses bedpan or commode    Transferring []  (1 POINT) Moves in and out of the bed or chair unassisted. Mechanical transfer aids are acceptable  [x] (0 POINTS) Needs help in moving from bed to chair or requires a complete transfer   Continence  [x]  (1 POINT) Exercises complete self control over urination and defecation  []  (0 POINTS) Is partially or totally incontinent of bowel or bladder   Feeding  [x]  (1 POINT) Gets food from plate into mouth without help. Preparation of food may be done by another person []  (0 POINTS) Needs partial or total help with feeding  or requires parenteral feeding.     Total Score = 2             Scoring: 6 = High (patient independent) 0 = Low (patient very dependent)      By Christene Slates, PhD, APRN, BC, [facility] [facility] [employer], and Metro Kung, PhD, ARNP, [facility]    Occupational Therapy Goals:   OT goals initiated 12/17/2020 and will be met by patient within 10 sessions     1. Patient will complete UB dressing/bathing with supervision to increase I in ADLs.  2. Patient will complete sit to stand transfer with CGA to increase I in functional transfers.  3. Patient will complete LB dressing/bathing with supervision to improve ADL function.  4. Patient will stand at sink x 8 minutes with fair+ balance to complete grooming tasks.  5. Patient will complete toileting tasks with supervision to improve ADL function.   _______________________________________________________________________    Thank you for this referral.    Earnest Conroy Cardwell, OTD, OTR/L

## 2020-12-17 NOTE — Progress Notes (Signed)
Problem: Falls - Risk of  Goal: *Absence of Falls  Description: Document Schmid Fall Risk and appropriate interventions in the flowsheet.  Outcome: Progressing Towards Goal  Note: Fall Risk Interventions:  Mobility Interventions: Assess mobility with egress test,Bed/chair exit alarm,OT consult for ADLs,PT Consult for mobility concerns,PT Consult for assist device competence    Mentation Interventions: Adequate sleep, hydration, pain control,Bed/chair exit alarm         Elimination Interventions: Bed/chair exit alarm,Call light in reach              Problem: Patient Education: Go to Patient Education Activity  Goal: Patient/Family Education  Outcome: Progressing Towards Goal

## 2020-12-17 NOTE — Progress Notes (Signed)
 Patient's son just called unit and spoke with this RN about patient's status. Patient's son was very upset that he was not listed as POA in patient's chart and not sure why patient's daughter in law (his ex wife) was named POA in chart. Patient's chart updated and son's information placed in chart. Update provided on patient's care and durable POA asked to be delivered by patient's son to be put in patient's chart. Patient's son stated that he would bring POA documents to hospital, but he was currently at work and just wanted an update about his mother. Patient's son was informed that this unit is not accepting visitors at this time and that he will have to wait for the patient to be downgraded and sent to another unit that allows visitors and patient's son verbalized an understanding and again, reiterated that he was at work and had been since 0400.      0625 Was informed by charge RN and nursing supervisor that patient's son was downstairs in ER trying to come up and see patient. Nursing supervisor given patient's son's phone number that he can be reached at.      Will continue to monitor patient and situation.

## 2020-12-17 NOTE — Progress Notes (Signed)
Repeat CT reviewed. No hemorrhage.   Signing off

## 2020-12-17 NOTE — Progress Notes (Signed)
SW called patient's son Carrye Goller [phone] to complete CMA, left VM

## 2020-12-17 NOTE — Progress Notes (Signed)
Son Tammy Sours updated about patient's new room and floor. All questions answered. Patient's son was made aware that they are also not allowing visitors on the 6th floor, where his mother is going to. Patient's son verbalized an understanding and no other questions at this time. Will continue to monitor.

## 2020-12-17 NOTE — Progress Notes (Signed)
Progress  Notes by Rosemary Holms, MD at 12/17/20 [phone]                Author: Rosemary Holms, MD  Service: Hospitalist  Author Type: Physician       Filed: 12/17/20 1559  Date of Service: 12/17/20 0842  Status: Signed          Editor: Rosemary Holms, MD (Physician)                          INTERNAL MEDICINE PROGRESS NOTE      Date of note:      December 17, 2020      Patient:               Beverly Nguyen,  86 y.o., female   Admit Date:        12/15/2020   Length of Stay:  2 day(s)              Subjective + interval history        Patient is alert, follows commands, denies headache. She does remember that she passed out , but denies dehydration, fever, dysuria, shortness of breath        Assessment :        Altered mental status secondary to metabolic encephalopathy    Subdural hematoma   Syncope   UTI   Hypertension   Hyperlipidemia   Hypothyroidism   dementia   ??     Plan :        Continue neurochecks, repeat CT head: No definite evidence of   subdural hygroma or subdural hematoma.No acute infarct, hemorrhage, mass effect   or extracerebral collections.  No interval change..    Neurosurgery consult appreciated       Ceftriaxone for UTI. Check urine culture ( discussed with microbiology lab and patient's RN)      BP elevated. Start amlodipine 5 mg daily. Continue metoprolol. Try to keep systolic BP < 150      PT OT evaluation   Orthostatic vitals      DVT ppx : SCD      Discussed with patient's medical power of attorney son mionna, advincula [phone].       - Code status: Full code      Recommend to continue hospitalization.   Expected date of discharge: 1 to 2 days   Plan for disposition -  transfer to neuro telemetry floor         Objective:                BP Readings from Last 1 Encounters:        12/17/20  (!) 122/58          Pulse Readings from Last 1 Encounters:        12/17/20  72          Resp Readings from Last 1 Encounters:        12/17/20  21          Temp Readings from Last 1 Encounters:        12/17/20   97.7 ??F (36.5 ??C)                       Intake/Output Summary (Last 24 hours) at 12/17/2020 0842   Last data filed at 12/16/2020 1257     Gross per 24 hour        Intake  50  ml        Output  50 ml        Net  0 ml             Physical Exam:        GEN - AAOx3, NAD   HEENT -  mucous membranes moist   Neck - supple, no JVD   Cardiac - RRR, S1, S2, no murmurs   Chest/Lungs - clear to auscultation without wheezes or rhonchi   Abdomen - soft, nontender   Extremities - no clubbing/ cyanosis/ edema   Neuro - CN 2-12 intact. No focal deficits. No motor or sensory deficit appreciated.  Frail   Skin - no rashes or lesions        Current medications:           Current Facility-Administered Medications:    ?  amLODIPine (NORVASC) tablet 5 mg, 5 mg, Oral, DAILY, Simoes, Ruchita S, MD, 5 mg at 12/17/20 0947   ?  metoprolol tartrate (LOPRESSOR) tablet 25 mg, 25 mg, Oral, BID, Simoes, Ruchita S, MD, 25 mg at 12/17/20 0962   ?  donepeziL (ARICEPT) tablet 10 mg, 10 mg, Oral, QHS, Roderic Scarce, MD, 10 mg at 12/16/20 2215   ?  levothyroxine (SYNTHROID) tablet 25 mcg, 25 mcg, Oral, ACB, Roderic Scarce, MD, 25 mcg at 12/17/20 8366   ?  memantine (NAMENDA) tablet 10 mg, 10 mg, Oral, BID, Roderic Scarce, MD, 10 mg at 12/17/20 2947   ?  pravastatin (PRAVACHOL) tablet 80 mg, 80 mg, Oral, DAILY, Roderic Scarce, MD, 80 mg at 12/17/20 6546   ?  sodium chloride (NS) flush 5-10 mL, 5-10 mL, IntraVENous, Q8H, Roderic Scarce, MD, 10 mL at 12/17/20 0600   ?  sodium chloride (NS) flush 5-10 mL, 5-10 mL, IntraVENous, PRN, Roderic Scarce, MD   ?  naloxone [facility]) injection 0.1 mg, 0.1 mg, IntraVENous, PRN, Roderic Scarce, MD   ?  acetaminophen (TYLENOL) tablet 650 mg, 650 mg, Oral, Q6H PRN, Roderic Scarce, MD   ?  HYDROcodone-acetaminophen (NORCO) 5-325 mg per tablet 1 Tablet, 1 Tablet, Oral, Q4H PRN, Roderic Scarce, MD   ?  alum-mag hydroxide-simeth (MYLANTA) oral suspension 30 mL, 30 mL, Oral, Q4H PRN, Roderic Scarce, MD   ?  albuterol (PROVENTIL  VENTOLIN) nebulizer solution 2.5 mg, 2.5 mg, Nebulization, Q6H PRN, Roderic Scarce, MD   ?  cefTRIAXone (ROCEPHIN) 1 g in 0.9% sodium chloride (MBP/ADV) 50 mL MBP, 1 g, IntraVENous, Q24H, Roderic Scarce, MD, Last Rate: 100 mL/hr at 12/17/20 0825, 1 g at 12/17/20 0825        Labs:        No results found for this or any previous visit (from the past 24 hour(s)).      XR Results:   Results from Hospital Encounter encounter on 12/15/20      XR CHEST SNGL V      Narrative   INDICATION: general weakness. Dyspnea   COMPARISON: none   TECHNIQUE: Single view chest.      Impression   IMPRESSION:   Heart size upper normal. Mild bibasilar atelectatic change. Otherwise, no focal   infiltrates. Multiple left rib fractures, which may be subacute or chronic.   Acute on chronic rib fractures not excluded. Please correlate clinically.         CT Results:   Results from Hospital Encounter encounter on 12/15/20      CT HEAD WO CONT      Narrative  History: sdh lethargy. Not eating or drinking for past days.      Impression   Impression:      Diffuse atrophy and small vessel disease.      Comment:      CT images of the head were obtained without intravenous contrast. This was   compared with December 15, 2020.      There is diffuse atrophy and small vessel disease. No definite evidence of   subdural hygroma or subdural hematoma.No acute infarct, hemorrhage, mass effect   or extracerebral collections.  No interval change.      All CT exams at this facility use one or more dose reduction techniques   including automatic exposure control, mA/kV adjustment per patient's size, or   iterative reconstruction technique.         MRI Results:   No results found for this or any previous visit.         Nuclear Medicine Results:   No results found for this or any previous visit.         [place] Results:   No results found for this or any previous visit.         IR Results:   No results found for this or any previous visit.         VAS/US Results:   No  results found for this or any previous visit.            Total clinical care time was 36  minutes of which more than 50% was spent in coordination of care and counseling (time spent with patient/family face to face, physical exam, reviewing laboratory and imaging investigations, speaking with physicians and  nursing staff involved in this patient's care).       Rosemary Holms MD    [facility]   December 17, 2020   Time: 10:36 AM

## 2020-12-17 NOTE — Progress Notes (Signed)
Problem: Falls - Risk of  Goal: *Absence of Falls  Description: Document Beverly Nguyen Fall Risk and appropriate interventions in the flowsheet.  Outcome: Progressing Towards Goal  Note: Fall Risk Interventions:  Mobility Interventions: Assess mobility with egress test,Bed/chair exit alarm,OT consult for ADLs,PT Consult for mobility concerns,PT Consult for assist device competence    Mentation Interventions: Adequate sleep, hydration, pain control,Bed/chair exit alarm         Elimination Interventions: Bed/chair exit alarm,Call light in reach              Problem: Patient Education: Go to Patient Education Activity  Goal: Patient/Family Education  Outcome: Progressing Towards Goal

## 2020-12-18 LAB — GLUCOSE, POC: Glucose (POC): 90 mg/dL (ref 65–105)

## 2020-12-18 LAB — POCT GLUCOSE: POC Glucose: 90 mg/dL (ref 65–105)

## 2020-12-18 MED ORDER — METOPROLOL TARTRATE 50 MG TAB
50 mg | Freq: Two times a day (BID) | ORAL | Status: DC
Start: 2020-12-18 — End: 2020-12-18

## 2020-12-18 MED ORDER — METOPROLOL TARTRATE 25 MG TAB
25 mg | Freq: Two times a day (BID) | ORAL | Status: DC
Start: 2020-12-18 — End: 2020-12-20
  Administered 2020-12-19 (×2): via ORAL

## 2020-12-18 MED FILL — METOPROLOL TARTRATE 25 MG TAB: 25 mg | ORAL | Qty: 1

## 2020-12-18 MED FILL — LEVOTHYROXINE 25 MCG TAB: 25 mcg | ORAL | Qty: 1

## 2020-12-18 MED FILL — MEMANTINE 5 MG TAB: 5 mg | ORAL | Qty: 2

## 2020-12-18 MED FILL — SODIUM CHLORIDE 0.9 % IJ SYRG: INTRAMUSCULAR | Qty: 10

## 2020-12-18 MED FILL — HYDROCODONE-ACETAMINOPHEN 5 MG-325 MG TAB: 5-325 mg | ORAL | Qty: 1

## 2020-12-18 MED FILL — AMLODIPINE 5 MG TAB: 5 mg | ORAL | Qty: 1

## 2020-12-18 MED FILL — CEFTRIAXONE 1 GRAM SOLUTION FOR INJECTION: 1 gram | INTRAMUSCULAR | Qty: 1

## 2020-12-18 MED FILL — PRAVASTATIN 40 MG TAB: 40 mg | ORAL | Qty: 2

## 2020-12-18 MED FILL — DONEPEZIL 10 MG TAB: 10 mg | ORAL | Qty: 1

## 2020-12-18 NOTE — Progress Notes (Signed)
Progress  Notes by Rosemary Holms, MD at 12/18/20 1339                Author: Rosemary Holms, MD  Service: Hospitalist  Author Type: Physician       Filed: 12/18/20 1353  Date of Service: 12/18/20 1339  Status: Signed          Editor: Rosemary Holms, MD (Physician)                          INTERNAL MEDICINE PROGRESS NOTE      Date of note:      December 18, 2020      Patient:               Beverly Nguyen,  86 y.o., female   Admit Date:        12/15/2020   Length of Stay:  3 day(s)              Subjective + interval history        Patient is alert, follows commands, denies headache. She does remember that she passed out , but denies dehydration, fever, dysuria, shortness of breath. ORTHOSTATIC         Assessment :        Altered mental status secondary to metabolic encephalopathy    Subdural hematoma   Orthostatic Syncope   UTI   Hypertension   Hyperlipidemia   Hypothyroidism   dementia   ??     Plan :        Continue neurochecks, repeat CT head: No definite evidence of   subdural hygroma or subdural hematoma.No acute infarct, hemorrhage, mass effect   or extracerebral collections.  No interval change..    Neurosurgery consult appreciated       Ceftriaxone for UTI. Check urine culture ( discussed with microbiology lab and patient's RN)      BP elevated. Start amlodipine 5 mg daily. Continue metoprolol. Try to keep systolic BP < 160      Syncope likely secondary orthostatic hypotension   PT OT evaluation   Orthostatic vitals   Permit hypertension      DVT ppx : SCD      Discussed with patient's medical power of attorney son evalynn, hankins [phone].       - Code status: Full code      Recommend to continue hospitalization.   Expected date of discharge: 1 to 2 days   Plan for disposition -  transfer to neuro telemetry floor         Objective:                BP Readings from Last 1 Encounters:        12/18/20  (!) 184/59          Pulse Readings from Last 1 Encounters:        12/18/20  80          Resp Readings from Last  1 Encounters:        12/18/20  16          Temp Readings from Last 1 Encounters:        12/18/20  97.9 ??F (36.6 ??C)                       Intake/Output Summary (Last 24 hours) at 12/18/2020 0939   Last data filed at 12/18/2020 [id number]  Gross per 24 hour        Intake  80 ml        Output  --        Net  80 ml             Physical Exam:        GEN - AAOx3, NAD   HEENT -  mucous membranes moist   Neck - supple, no JVD   Cardiac - RRR, S1, S2, no murmurs   Chest/Lungs - clear to auscultation without wheezes or rhonchi   Abdomen - soft, nontender   Extremities - no clubbing/ cyanosis/ edema   Neuro - CN 2-12 intact. No focal deficits. No motor or sensory deficit appreciated.  Frail   Skin - no rashes or lesions        Current medications:           Current Facility-Administered Medications:    ?  amLODIPine (NORVASC) tablet 5 mg, 5 mg, Oral, DAILY, Simoes, Ruchita S, MD, 5 mg at 12/18/20 0902   ?  metoprolol tartrate (LOPRESSOR) tablet 25 mg, 25 mg, Oral, BID, Simoes, Ruchita S, MD, 25 mg at 12/18/20 0902   ?  donepeziL (ARICEPT) tablet 10 mg, 10 mg, Oral, QHS, Roderic Scarce, MD, 10 mg at 12/17/20 2122   ?  levothyroxine (SYNTHROID) tablet 25 mcg, 25 mcg, Oral, ACB, Roderic Scarce, MD, 25 mcg at 12/18/20 0516   ?  memantine [facility]) tablet 10 mg, 10 mg, Oral, BID, Roderic Scarce, MD, 10 mg at 12/18/20 5573   ?  pravastatin (PRAVACHOL) tablet 80 mg, 80 mg, Oral, DAILY, Roderic Scarce, MD, 80 mg at 12/18/20 2202   ?  sodium chloride (NS) flush 5-10 mL, 5-10 mL, IntraVENous, Q8H, Roderic Scarce, MD, 10 mL at 12/18/20 5427   ?  sodium chloride (NS) flush 5-10 mL, 5-10 mL, IntraVENous, PRN, Roderic Scarce, MD   ?  naloxone [facility]) injection 0.1 mg, 0.1 mg, IntraVENous, PRN, Roderic Scarce, MD   ?  acetaminophen (TYLENOL) tablet 650 mg, 650 mg, Oral, Q6H PRN, Roderic Scarce, MD   ?  HYDROcodone-acetaminophen (NORCO) 5-325 mg per tablet 1 Tablet, 1 Tablet, Oral, Q4H PRN, Roderic Scarce, MD, 1 Tablet at 12/18/20 [phone]   ?   alum-mag hydroxide-simeth (MYLANTA) oral suspension 30 mL, 30 mL, Oral, Q4H PRN, Roderic Scarce, MD   ?  albuterol (PROVENTIL VENTOLIN) nebulizer solution 2.5 mg, 2.5 mg, Nebulization, Q6H PRN, Roderic Scarce, MD   ?  cefTRIAXone (ROCEPHIN) 1 g in 0.9% sodium chloride (MBP/ADV) 50 mL MBP, 1 g, IntraVENous, Q24H, Roderic Scarce, MD, Last Rate: 100 mL/hr at 12/18/20 0907, 1 g at 12/18/20 7628        Labs:          Recent Results (from the past 24 hour(s))     GLUCOSE, POC          Collection Time: 12/17/20 10:05 PM         Result  Value  Ref Range            Glucose (POC)  90  65 - 105 mg/dL           XR Results:   Results from Hospital Encounter encounter on 12/15/20      XR CHEST SNGL V      Narrative   INDICATION: general weakness. Dyspnea   COMPARISON: none   TECHNIQUE: Single view chest.      Impression   IMPRESSION:  Heart size upper normal. Mild bibasilar atelectatic change. Otherwise, no focal   infiltrates. Multiple left rib fractures, which may be subacute or chronic.   Acute on chronic rib fractures not excluded. Please correlate clinically.         CT Results:   Results from Hospital Encounter encounter on 12/15/20      CT HEAD WO CONT      Narrative   History: sdh lethargy. Not eating or drinking for past days.      Impression   Impression:      Diffuse atrophy and small vessel disease.      Comment:      CT images of the head were obtained without intravenous contrast. This was   compared with December 15, 2020.      There is diffuse atrophy and small vessel disease. No definite evidence of   subdural hygroma or subdural hematoma.No acute infarct, hemorrhage, mass effect   or extracerebral collections.  No interval change.      All CT exams at this facility use one or more dose reduction techniques   including automatic exposure control, mA/kV adjustment per patient's size, or   iterative reconstruction technique.         MRI Results:   No results found for this or any previous visit.         Nuclear  Medicine Results:   No results found for this or any previous visit.         [place] Results:   No results found for this or any previous visit.         IR Results:   No results found for this or any previous visit.         VAS/US Results:   No results found for this or any previous visit.            Total clinical care time was 36  minutes of which more than 50% was spent in coordination of care and counseling (time spent with patient/family face to face, physical exam, reviewing laboratory and imaging investigations, speaking with physicians and  nursing staff involved in this patient's care).       Rosemary Holms MD    [facility]   December 18, 2020   Time: 10:36 AM

## 2020-12-18 NOTE — Progress Notes (Signed)
Echo completed

## 2020-12-19 MED ORDER — METOPROLOL TARTRATE 50 MG TAB
50 mg | ORAL_TABLET | Freq: Every day | ORAL | 0 refills | Status: DC
Start: 2020-12-19 — End: 2021-01-13

## 2020-12-19 MED FILL — DONEPEZIL 10 MG TAB: 10 mg | ORAL | Qty: 1

## 2020-12-19 MED FILL — HYDROCODONE-ACETAMINOPHEN 5 MG-325 MG TAB: 5-325 mg | ORAL | Qty: 1

## 2020-12-19 MED FILL — CEFTRIAXONE 1 GRAM SOLUTION FOR INJECTION: 1 gram | INTRAMUSCULAR | Qty: 1

## 2020-12-19 MED FILL — PRAVASTATIN 40 MG TAB: 40 mg | ORAL | Qty: 2

## 2020-12-19 MED FILL — LEVOTHYROXINE 25 MCG TAB: 25 mcg | ORAL | Qty: 1

## 2020-12-19 MED FILL — METOPROLOL TARTRATE 25 MG TAB: 25 mg | ORAL | Qty: 1

## 2020-12-19 MED FILL — MEMANTINE 5 MG TAB: 5 mg | ORAL | Qty: 2

## 2020-12-19 NOTE — Discharge Summary (Signed)
Discharge Summary       Patient: Beverly Nguyen Age: 86 y.o. DOB: [birth year] MR#: [id number] SSN: [id number]  PCP on record: None  Admit date: 12/15/2020  Discharge date: 12/19/2020    Admission Diagnoses:   Subdural hematoma (HCC) [X65.5V7S]  -    Discharge Diagnoses:       Acute  metabolic encephalopathy   Subdural hematoma  Orthostatic Syncope  UTI  Hypertension  Hyperlipidemia  Hypothyroidism  dementia  Medical History   Per H&P,Beverly Nguyen is a 86 y.o. WHITE/NON-HISPANIC female who presents with passing out episode  Patient states that she was told that he passed out but does not remember anything  Patient was found lethargic at home not eating and drinking well unable to obtain further history because of dementia      Hospital Course by Problem     Altered mental status and likely acute metabolic encephalopathy   continue neurochecks, repeat CT head: No definite evidence of  subdural hygroma or subdural hematoma.No acute infarct, hemorrhage, mass effect  or extracerebral collections. ??No interval change..   Neurosurgery consult appreciated   ??  Uncomplicated UTI.  Complete ceftriaxone for UTI.  X5 days    Syncope likely secondary orthostatic hypotension  Echocardiogram showing ejection fraction of 70%  PT OT evaluation  Orthostatic vitals  Permit hypertension    Hypothyroidism we will continue Synthroid replacement    Dementia monitor mental status      PT evaluation    Declined the rehab, stable to discharge her home with home health and home PT        Today's examination of the patient revealed:     Objective:   VS:   Visit Vitals  BP (!) 118/40 (BP 1 Location: Left arm, BP Patient Position: Supine)   Pulse (!) 59   Temp 97.5 ??F (36.4 ??C)   Resp 16   Ht 5\' 1"  (1.549 m)   Wt 58.2 kg (128 lb 4.9 oz)   SpO2 100%   Breastfeeding No   BMI 24.24 kg/m??      Tmax/24hrs: Temp (24hrs), Avg:97.9 ??F (36.6 ??C), Min:97.5 ??F (36.4 ??C), Max:98.6 ??F (37 ??C)     Input/Output:     Intake/Output Summary (Last 24 hours) at 12/19/2020 1143  Last  data filed at 12/18/2020 2100  Gross per 24 hour   Intake 118 ml   Output ???   Net 118 ml       General appearance: no distress  Eyes: sclera anicteric  ENT: no oral lesions, thyroid normal  Skin: no spider angiomata, jaundice,   Respiratory: clear to auscultation bilaterally  Cardiovascular: regular heart rate, no murmurs, no JVD  Abdomen: soft, non-tender, no rebound  Extremities: no muscle wasting, no gross arthritic changes  GU: not examined  Neurologic: alert and oriented, cranial nerves grossly intact,         Labs:    No results found for this or any previous visit (from the past 24 hour(s)).  Additional Data Reviewed:      XR Results (most recent):  Results from Hospital Encounter encounter on 12/15/20    XR CHEST SNGL V    Narrative  INDICATION: general weakness. Dyspnea  COMPARISON: none  TECHNIQUE: Single view chest.    Impression  IMPRESSION:  Heart size upper normal. Mild bibasilar atelectatic change. Otherwise, no focal  infiltrates. Multiple left rib fractures, which may be subacute or chronic.  Acute on chronic rib fractures not excluded. Please correlate clinically.  MRI Results (most recent):  No results found for this or any previous visit.      CT Results (most recent):  Results from Hospital Encounter encounter on 12/15/20    CT HEAD WO CONT    Narrative  History: sdh lethargy. Not eating or drinking for past days.    Impression  Impression:    Diffuse atrophy and small vessel disease.    Comment:    CT images of the head were obtained without intravenous contrast. This was  compared with December 15, 2020.    There is diffuse atrophy and small vessel disease. No definite evidence of  subdural hygroma or subdural hematoma.No acute infarct, hemorrhage, mass effect  or extracerebral collections.  No interval change.    All CT exams at this facility use one or more dose reduction techniques  including automatic exposure control, mA/kV adjustment per patient's size, or  iterative reconstruction  technique.           Condition:       Diet:     Disposition:    [] Home   [] Home with Home Health   [] SNF/NH   [] Rehab   [] Home with family   [] Alternate Facility:____________________      Discharge Medications:        My Medications      CHANGE how you take these medications      Instructions Each Dose to Equal Morning Noon Evening Bedtime   metoprolol tartrate 50 mg tablet  Commonly known as: LOPRESSOR  What changed: how much to take    Your last dose was:     Your next dose is:         Take 0.5 Tablets by mouth daily.   25 mg                    CONTINUE taking these medications      Instructions Each Dose to Equal Morning Noon Evening Bedtime   Aricept 10 mg tablet  Generic drug: donepeziL    Your last dose was:     Your next dose is:         Take 10 mg by mouth nightly.   10 mg                 Boniva 150 mg tablet  Generic drug: ibandronate    Your last dose was:     Your next dose is:         Take 150 mg by mouth every thirty (30) days.   150 mg                 citalopram 20 mg tablet  Commonly known as: CELEXA    Your last dose was:     Your next dose is:         Take 20 mg by mouth daily.   20 mg                 megestroL 400 mg/10 mL (40 mg/mL) suspension  Commonly known as: MEGACE    Your last dose was:     Your next dose is:         Take 200 mg by mouth daily.   200 mg                 Namenda 10 mg tablet  Generic drug: memantine    Your last dose was:     Your next dose is:  Take 10 mg by mouth two (2) times a day.   10 mg                 PravachoL 80 mg tablet  Generic drug: pravastatin    Your last dose was:     Your next dose is:         Take 80 mg by mouth daily.   80 mg                 synthroid 25 mcg tablet  Generic drug: levothyroxine    Your last dose was:     Your next dose is:         Take 25 mcg by mouth Daily (before breakfast).   25 mcg                       Where to Get Your Medications      These medications were sent to [id number] [place], [place] - [address]  [address], [place] [address]    Phone: [phone]   ?? metoprolol tartrate 50 mg tablet             Follow-up Appointments and instructions:   Your PCP: None, within 3-5 days  Follow-up Information     Follow up With Specialties Details Why Contact Info    None    None ([phone]) Patient stated that they have no PCP              Consults:    Treatment Team: Treatment Team: Attending Provider: Rosemary Holms, MD; Consulting Provider: Roderic Scarce, MD; Staff Nurse: Everitt Amber, RN; Primary Nurse: Jeryl Columbia, RN      Thank you Dr None for allowing [place] to participate in the care of Renella Cunas     [facility] medical dictation software was used for portions of this report.  Unintended voice transcription errors may have occurred.)      >42 minutes spent coordinating this discharge (review instructions/follow-up, prescriptions)    Signed:    [facility]  Rosemary Holms, MD  12/19/2020  11:43 AM

## 2020-12-19 NOTE — Progress Notes (Signed)
Transport to : HOME  Reason : DEMENTIA  transport set up with : [facility]  Time/date 02/04@1900   Dc summary loaded Y  nurse/cm notified Y   envelope delivered [employer] verified on face sheet Y  auth needed    N  auth #

## 2021-01-04 ENCOUNTER — Emergency Department: Admit: 2021-01-04 | Payer: MEDICARE

## 2021-01-04 ENCOUNTER — Inpatient Hospital Stay
Admit: 2021-01-04 | Discharge: 2021-01-13 | Disposition: A | Discharge status: Rehabilitation Facility | Payer: MEDICARE | Attending: Hospitalist | Admitting: Hospitalist

## 2021-01-04 DIAGNOSIS — S065X0A Traumatic subdural hemorrhage without loss of consciousness, initial encounter: Principal | ICD-10-CM

## 2021-01-04 MED ORDER — SODIUM CHLORIDE 0.9 % IJ SYRG
Freq: Once | INTRAMUSCULAR | Status: AC
Start: 2021-01-04 — End: 2021-01-04
  Administered 2021-01-05: 01:00:00 via INTRAVENOUS

## 2021-01-04 NOTE — H&P (Signed)
H&P by Roderic Scarce,  MD at 01/04/21 2105                Author: Roderic Scarce, MD  Service: Hospitalist  Author Type: Physician       Filed: 01/04/21 2113  Date of Service: 01/04/21 2105  Status: Signed          Editor: Roderic Scarce, MD (Physician)               Hospitalist Admission History and Physical      NAME:  Beverly Nguyen     DOB:   October 21, 1935    MRN:   [id number]       PCP:  None   Admission Date/Time:  01/04/2021  9:05 PM   Anticipated Date of Discharge: 01/07/2021   Anticipated Disposition (home, SNF) : HH               Assessment/Plan:         Active Problems:     Subdural hematoma (HCC) (12/15/2020)        Fall (01/04/2021)        Dementia (HCC) (01/04/2021)          Left subdural hematoma with 5.8 mm midline shift   Left frontal scalp hematoma   Hypertension   Hyperlipidemia   Hypothyroidism      ___________________________________________________   PLAN:     Admit the patient to ICU   Neurochecks every hour   Keppra IV   Dexamethasone IV   Lopressor   Pravastatin, donezepil and citalopram as patient takes at home   Hold aspirin   IV fluids   Electrolyte replacement per ICU protocol   Watch for signs of increased intracranial tension including dense deficits, vomiting, change in vision or severe headache   If blood pressure remains high, will need to be started on IV antihypertensive like Cardene to get the blood pressure down                  Risk of deterioration:  []  Low    [] Moderate  [x] High      Prophylaxis:  []  Lovenox  []  Coumadin  [] Hep SQ  [x] SCDs  [] H2B/PPI[]  Eliquis [] Xarelto       Disposition:  []  Home w/ Family   [x]  HH PT,OT,RN   []  SNF/LTC   []  SAH/Rehab                                     Subjective:     CHIEF COMPLAINT:       Chief Complaint       Patient presents with        ?  Fall        ?  Head Injury           HISTORY OF PRESENT ILLNESS:      Beverly Nguyen is a 86 y.o.  WHITE/NON-HISPANIC female who presents with for evaluation of head injury after a fall   Patient suffered large  hematoma on the left side of the forehead, never lost consciousness   Patient is not on any anticoagulation, has been having falls this with the second fall today   When patient fell down patient also suffered a tear on the left arm        Past Medical History:        Diagnosis  Date         ?  Arthritis       ?  Dementia (HCC)       ?  High cholesterol       ?  Hypertension       ?  S/P hip replacement           ?  Thyroid disorder                 Surgical history   States she has had hysterectomy   ??        Social History          Tobacco Use         ?  Smoking status:  Former Smoker     ?  Smokeless tobacco:  Never Used        ?  Tobacco comment: dementia       Substance Use Topics         ?  Alcohol use:  Never              Family History       Family history unknown: Yes           Family History       Family history unknown: Yes     Mother died of old age   Father died of liver cirrhosis           Allergies        Allergen  Reactions         ?  Pcn [Penicillins]  Hives              Prior to Admission Medications     Prescriptions  Last Dose  Informant  Patient Reported?  Taking?      aspirin 81 mg chewable tablet      Yes  Yes      Sig: Take 81 mg by mouth daily.      citalopram (CELEXA) 20 mg tablet      Yes  Yes      Sig: Take 20 mg by mouth daily.      donepeziL (Aricept) 10 mg tablet      Yes  Yes      Sig: Take 10 mg by mouth nightly.      ibandronate (Boniva) 150 mg tablet      Yes  Yes      Sig: Take 150 mg by mouth every thirty (30) days.      levothyroxine (synthroid) 25 mcg tablet      Yes  Yes      Sig: Take 25 mcg by mouth Daily (before breakfast).      loperamide-simethicone (Imodium Multi-Symptom Relief) 2-125 mg tab      Yes  Yes      Sig: Take  by mouth.      megestroL (MEGACE) 400 mg/10 mL (40 mg/mL) suspension      Yes  No      Sig: Take 200 mg by mouth daily.      memantine (Namenda) 10 mg tablet      Yes  Yes      Sig: Take 10 mg by mouth two (2) times a day.      metoprolol tartrate  (LOPRESSOR) 50 mg tablet      No  Yes      Sig: Take 0.5 Tablets by mouth daily.      Patient taking differently: Take 50 mg by mouth two (2) times a day.  metoprolol tartrate (LOPRESSOR) 50 mg tablet      Yes  Yes      Sig: Take 25 mg by mouth daily.      pravastatin (PravachoL) 80 mg tablet      Yes  Yes      Sig: Take 80 mg by mouth daily.               Facility-Administered Medications: None                 REVIEW OF SYSTEMS:       Unable to obtain  ROS due to  mental status change   sedated   intubated    Total of 12 systems reviewed as follows:   Constitutional: negative fever, negative chills, negative weight loss   Eyes:   negative visual changes   HENT:   Left frontal scalp hematoma   Respiratory:  negative cough, negative dyspnea   Cards:  negative for chest pain, palpitations, lower extremity edema   GI:   negative for nausea, vomiting, diarrhea, and abdominal pain   Genitourinary: negative for frequency, dysuria   Integument:  negative for rash and pruritus   Hematologic:  negative for easy bruising and gum/nose bleeding   Musculoskel: Left elbow bruising and laceration   Neurological:   negative for headaches, dizziness, vertigo   Behavl/Psych: negative for feelings of anxiety, depression            Objective:     VITALS:     Visit Vitals      BP  (!) 177/61 (BP 1 Location: Right arm, BP Patient Position: At rest)     Pulse  63     Temp  97.8 ??F (36.6 ??C)     Resp  16        SpO2  100%        Temp (24hrs), Avg:97.8 ??F (36.6 ??C), Min:97.8 ??F (36.6 ??C), Max:97.8 ??F (36.6 ??C)         PHYSICAL EXAM: (Seen with PPE , gloves , gown , mask n95 and goggles )   General:    Alert, cooperative, no distress, appears stated age.      Head:   Left frontal scalp hematoma, no active bleeding   Eyes:   Conjunctivae clear, anicteric sclerae.  Pupils are equal   Nose:  Nares normal. No drainage or sinus tenderness.   Throat:    Lips, mucosa, and tongue normal.  No Thrush   Neck:  Supple, symmetrical,  no  adenopathy, thyroid: non tender     no carotid bruit and no JVD.   Back:    Symmetric,  No CVA tenderness.   Lungs:   Clear to auscultation bilaterally.  No Wheezing or Rhonchi. No rales.   Chest wall:  No tenderness or deformity. No Accessory muscle use.   Heart:   Regular rate and rhythm,  no murmur, rub or gallop.   Abdomen:   Soft, non-tender. Not distended.  Bowel sounds normal. No masses   Extremities: Abrasion over the left elbow which is currently wrapped, normal upper and lower extremity distal pulses   Skin:     Texture, turgor normal. No rashes or lesions.  Not Jaundiced   Psych:  Anxious   Neurologic: Awake, follows commands, reflexes are normal, symmetrical strength in both upper and lower extremity         LAB DATA REVIEWED:       Recent Results (from the past 12 hour(s))  CBC WITH AUTOMATED DIFF          Collection Time: 01/04/21  7:40 PM         Result  Value  Ref Range            WBC  8.5  4.0 - 11.0 1000/mm3       RBC  4.01  3.60 - 5.20 M/uL       HGB  11.4 (L)  13.0 - 17.2 gm/dl       HCT  87.5 (L)  64.3 - 50.0 %       MCV  88.8  80.0 - 98.0 fL       MCH  28.4  25.4 - 34.6 pg       MCHC  32.0  30.0 - 36.0 gm/dl       PLATELET  329  518 - 450 1000/mm3       MPV  9.5  6.0 - 10.0 fL       RDW-SD  45.2  36.4 - 46.3         NRBC  0  0 - 0         IMMATURE GRANULOCYTES  0.4  0.0 - 3.0 %       NEUTROPHILS  73.2 (H)  34 - 64 %       LYMPHOCYTES  16.7 (L)  28 - 48 %       MONOCYTES  8.5  1 - 13 %       EOSINOPHILS  0.7  0 - 5 %       BASOPHILS  0.5  0 - 3 %       METABOLIC PANEL, BASIC          Collection Time: 01/04/21  7:40 PM         Result  Value  Ref Range            Sodium  133 (L)  136 - 145 mEq/L       Potassium  3.8  3.5 - 5.1 mEq/L       Chloride  100  98 - 107 mEq/L       CO2  25  21 - 32 mEq/L            Glucose  105  74 - 106 mg/dl            BUN  12  7 - 25 mg/dl       Creatinine  1.4 (H)  0.6 - 1.3 mg/dl       GFR est AA  84.1          GFR est non-AA  38          Calcium  9.3  8.5 - 10.1  mg/dl       Anion gap  8  5 - 15 mmol/L       PROTHROMBIN TIME + INR          Collection Time: 01/04/21  7:40 PM         Result  Value  Ref Range            Prothrombin time  13.3 (H)  10.2 - 12.9 seconds       INR  1.1  0.1 - 1.1         PTT          Collection Time: 01/04/21  7:40 PM         Result  Value  Ref Range            aPTT  32.7  25.1 - 36.5 seconds              IMAGING RESULTS:      XR SHOULDER LT AP/LAT MIN 2 V      Result Date: 01/04/2021   Indication: Left shoulder pain after fall.       IMPRESSION: 2 views of the left shoulder reveal nondisplaced fracture through the distal clavicle. There is old left sixth rib trauma. Remainder of the examination is unremarkable.       XR ELBOW LT MIN 3 V      Result Date: 01/04/2021   Indication: Larey Seat.       IMPRESSION: 3 views of the left elbow reveal no fracture, dislocation or foreign body.       CT HEAD WO CONT      Addendum Date: 01/04/2021     History: Fall with evidence of hematoma, second fall today, not on anticoagulation, rule out intracranial processes Impression: New isodense left subdural hematoma with 5.8 mm midline shift to the right. Left frontal scalp hematoma. No fracture. Comment:  CT images of the head were obtained without intravenous contrast. This was compared with December 16, 2020 and December 15, 2020. There is diffuse atrophy and small vessel disease. Left isodense subdural hematoma has developed since February 1, 202214 measuring  mm in greatest thickness. There is no midline shift to the right by 5.8 mm.. Prominent left frontal scalp hematoma is noted without underlying bony fracture. This was discussed with Dr. Clarene Duke in the emergency room at 7:21 PM January 04, 2021. All CT  exams at this facility use one or more dose reduction techniques including automatic exposure control, mA/kV adjustment per patient's size, or iterative reconstruction technique.       Result Date: 01/04/2021   History: Fall with evidence of hematoma, second fall  today, not on anticoagulation, rule out intracranial processes       Impression: New isodense left subdural hematoma with 5.8 mm midline shift to the right. Comment: CT images of the head were obtained without intravenous contrast. This was compared with December 16, 2020 and December 15, 2020. There is diffuse atrophy and  small vessel disease. Left isodense subdural hematoma has developed since February 1, 202214 measuring mm in greatest thickness. There is no midline shift to the right by 5.8 mm. This was discussed with Dr. Clarene Duke in the emergency room at 7:21 PM January 04, 2021. All CT exams at this facility use one or more dose reduction techniques including automatic exposure control, mA/kV adjustment per patient's size, or iterative reconstruction technique.       CT SPINE CERV WO CONT      Result Date: 01/04/2021   Indication: To fall today, rule out acute fracture dislocation of the cervical spine.        IMPRESSION: Mild degenerative changes. No fracture. Comment: CT images of the cervical spine were obtained without intravenous contrast. No prior studies for comparison. No fracture or dislocation. There are mild degenerative changes about the atlantoaxial  joint. Mild chronic discogenic disease is present C5-6. Small osteophyte disc complex produces minimal spinal stenosis. Vertebral body stature alignment and intervertebral disc space heights are preserved throughout. Facet and uncovertebral joints are  intact. All CT exams at this facility use one or more dose reduction techniques including automatic exposure control, mA/kV adjustment per patient's size, or iterative reconstruction technique.  Care Plan discussed with:      Patient   Family     ED Care Manager  ED Doc   Specialist :      Total  Critical care time spent on reviewing the case/data/notes/EMR,examining the patient,documentation,coordinating care with nurses and consultants is 40 minutes.            ___________________________________________________   Admitting Physician: Roderic Scarce, MD       Dragon medical dictation software was used for portions of this report.  Unintended voice transcription errors may have occurred.

## 2021-01-04 NOTE — Consults (Signed)
[facility]     ICU Consult Note    PATIENT:  Beverly Nguyen  SEX:   female  DOB: [birth year]  AGE: 86 y.o.  DOA: 01/04/2021  LOS: LOS: 0 days   Code Status: Prior    DATE OF CONSULT:January 04, 2021    ATTENDING PHYSICIAN: Dr. Jerilynn Mages. Patel  APC: Armida Sans, AGACNP-BC    REFERRING PHYSICIAN: Dr. Thurnell Garbe    REASON FOR CONSULTATION: NeuroICU monitoring    ASSESSMENT:     -Recurrent fall with resultant left subdural hematoma with 5.8 mm midline shift on head CT 01/04/2021  -Left frontal scalp hematoma  -Nondisplaced left shoulder fracture through the distal clavicle on shoulder x-ray 01/04/2021  -Hypertension, latest echocardiogram on 12/18/2020 with EF of 70% and RVSP of 28  -Hyperlipidemia  -Hypothyroidism  -Osteoporosis  -Dementia    PLAN:     -Currently on room air. May use supplemental oxygen as needed and titrate settings to keep SPO2 >90%  -DuoNebs as needed  -Neurochecks and pupillometry readings per ICU protocol  -Continue recommendations per neurosurgery.    -Keep blood systolic blood pressure less than 140. Use nicardipine drip as necessary.  On p.o. metoprolol.  -Continue NS at 75 ml/hour.   -Keep HOB at 30 degrees  -Continue dexamethasone 6 mg IV every 6 hours neurosurgery.  -Continue Keppra 500 mg IV for seizure prophylaxis  -Follow troponin, PT PTT, INR, fibrinogen, lipid panel, TSH, T3, T4, cortisol, ESR and CRP levels  -Repeat head CT in the morning, or STAT with any acute neurological change  -Orthopedic surgery is also following. She will be evaluated in the morning.  -Avoid nephrotoxins. Renally dose medications. Monitor urine output. Daily weights.  -Electrolyte replacements per standard protocol.   -Glucomander per protocol  -BMP and CBC in the AM    -SUP: protonix  -DVT prophylaxis: SCDs    -Further recommendations will be based on the patient's response to recommended treatment and results of the investigation ordered.    [x] Total critical care time exclusive  of procedures 45 minutes with complex decision making performed and > 50% time spent in face to face consultation.    HISTORY OF PRESENT ILLNESS     Beverly Nguyen is a 86 y.o. WHITE/NON-HISPANIC female with PMHx of dementia, hypertension, hyperlipidemia, and osteoporosis who presents at [facility] on January 04, 2021 from home for a fall.    The patient reports that she had fallen twice today and was found by her son with a hematoma on the left side of her forehead and abrasion and bruising on her left elbow. She does not recall the events leading to her fall.  She denies losing consciousness.  EMS was called and she was transported to the hospital for further evaluation and treatment.    -The patient reports that she lives by herself at home, and her son checks on her about 3 times a week.  She was recently admitted to the hospital on 12/16/2020 also for a syncopal episode and urinary tract infection.  Her head CT during that time showed small bilateral subdural hematomas. She was started on antibiotics, placed on neuro observation and was discharged after 48 hours. She is currently NOT on anticoagulation therapy. She is supposed to be on aspirin, but she reports that she does not take it daily. She denies chest pain, abdominal pain, chills, fever, cough, diarrhea, nausea, vomiting, and urinary symptoms.     She arrived hypertensive with systolic blood pressure  in the 150s in the Emergency room.  She is complaining of left arm pain.  Workup revealed the following laboratory values and imaging:    -CT head: New isodense left subdural hematoma with 5.8 mm midline shift to the right    -CT spine: No acute fracture    -Left elbow x-ray: No acute fracture or dislocation.    -Left shoulder x-ray: Nondisplaced left shoulder fracture through the distal clavicle.    -Labwork results were pertinent for: WBC 8.8, H&H 9.7/29.8, PLT 286, INR 1.1, PT 30.3, PTT 32.7, sodium 134, Potassium 3.8, Glucose 91,  Creatinine 1.3, BUN 28, CO2 19    -Urine studies pending    ER Treatment course: She was started on dexamethasone 4 mg IV every 6 hours and Keppra for seizure prophylaxis.     She will be admitted to the ICU. Critical care was consulted for neuro ICU monitoring and comanagement of other medical conditions.    Other consultations: (Recommendations and treatment plan summarized as per chart review or established in consult)    -Neurosurgery (Dr. Morton Stall): Continue supportive treatment for now. Consideration for middle meningeal artery embolization.    -Orthopedic surgery: Pending evaluation    Interval History/Events:  -On exam at the bedside, the patient is alert, pleasant, oriented to place, self, and situation.  BP 154/61, HR 67, RR 18, temp 90 7.41F and oxygen saturations 100% on room air. She has bruising and a dressing in place on her left elbow. She also has swelling and hematoma on her left forehead. She denies chest/abdominal pain/shortness of breath, and headache.       Past Medical History  Past Medical History:   Diagnosis Date   ??? Arthritis    ??? Dementia ([place])    ??? High cholesterol    ??? Hypertension    ??? S/P hip replacement    ??? Thyroid disorder        Past Surgical History  History reviewed. No pertinent surgical history.    Family History  Family History   Family history unknown: Yes       Social History  Social History     Tobacco Use   ??? Smoking status: Former Smoker   ??? Smokeless tobacco: Never Used   ??? Tobacco comment: dementia   Substance Use Topics   ??? Alcohol use: Never   ??? Drug use: Never        Medications  Prior to Admission medications    Medication Sig Start Date End Date Taking? Authorizing Provider   aspirin 81 mg chewable tablet Take 81 mg by mouth daily.   Yes Other, Phys, MD   loperamide-simethicone (Imodium Multi-Symptom Relief) 2-125 mg tab Take  by mouth.   Yes Other, Phys, MD   metoprolol tartrate (LOPRESSOR) 50 mg tablet Take 25 mg by mouth daily.   Yes Other, Phys, MD   metoprolol  tartrate (LOPRESSOR) 50 mg tablet Take 0.5 Tablets by mouth daily.  Patient taking differently: Take 50 mg by mouth two (2) times a day. 12/19/20  Yes Otila Kluver, MD   ibandronate (Boniva) 150 mg tablet Take 150 mg by mouth every thirty (30) days.   Yes Other, Phys, MD   citalopram (CELEXA) 20 mg tablet Take 20 mg by mouth daily.   Yes Other, Phys, MD   donepeziL (Aricept) 10 mg tablet Take 10 mg by mouth nightly.   Yes Provider, Historical   levothyroxine (synthroid) 25 mcg tablet Take 25 mcg by mouth Daily (before breakfast).  Yes Provider, Historical   memantine (Namenda) 10 mg tablet Take 10 mg by mouth two (2) times a day.   Yes Provider, Historical   pravastatin (PravachoL) 80 mg tablet Take 80 mg by mouth daily.   Yes Provider, Historical   megestroL (MEGACE) 400 mg/10 mL (40 mg/mL) suspension Take 200 mg by mouth daily.    Other, Phys, MD       No current facility-administered medications for this encounter.     Current Outpatient Medications   Medication Sig   ??? aspirin 81 mg chewable tablet Take 81 mg by mouth daily.   ??? loperamide-simethicone (Imodium Multi-Symptom Relief) 2-125 mg tab Take  by mouth.   ??? metoprolol tartrate (LOPRESSOR) 50 mg tablet Take 25 mg by mouth daily.   ??? metoprolol tartrate (LOPRESSOR) 50 mg tablet Take 0.5 Tablets by mouth daily. (Patient taking differently: Take 50 mg by mouth two (2) times a day.)   ??? ibandronate (Boniva) 150 mg tablet Take 150 mg by mouth every thirty (30) days.   ??? citalopram (CELEXA) 20 mg tablet Take 20 mg by mouth daily.   ??? donepeziL (Aricept) 10 mg tablet Take 10 mg by mouth nightly.   ??? levothyroxine (synthroid) 25 mcg tablet Take 25 mcg by mouth Daily (before breakfast).   ??? memantine (Namenda) 10 mg tablet Take 10 mg by mouth two (2) times a day.   ??? pravastatin (PravachoL) 80 mg tablet Take 80 mg by mouth daily.   ??? megestroL (MEGACE) 400 mg/10 mL (40 mg/mL) suspension Take 200 mg by mouth daily.       Allergy  Allergies   Allergen Reactions   ???  Pcn [Penicillins] Hives         ROS     Review of Systems   Skin: Positive for wound.   Hematological: Bruises/bleeds easily.        Physical Exam      GENERAL: Awake, alert, comfortable.  Oriented to name and place.  HEENT: Oral mucosa moist. No thrush.   NECK: Supple. Trachea midline.   RESPIRATORY: Bilateral BS present, decreased at bases. No rales or rhonchi.   CARDIOVASCULAR: S1 and S2 present. No murmur, rub, or thrill.   ABDOMEN: Soft and nontender with positive bowel sounds. No organomegaly.   MUSCULOSKELETAL: No joint effusions or joint tenderness.   SKIN:Left elbow hematoma and abrasion, left foreahead hematioma  NEUROLOGICAL: Conscious, no focal weakness.      Patient Vitals for the past 24 hrs:   Temp Pulse Resp BP SpO2   01/04/21 1945 ??? 63 16 (!) 177/61 100 %   01/04/21 1801 97.8 ??F (36.6 ??C) (!) 59 14 (!) 154/61 100 %   01/04/21 1731 ??? (!) 57 16 (!) 149/60 97 %       Intake/Output:   Last shift:      No intake/output data recorded.  Last 3 shifts: No intake/output data recorded.  No intake or output data in the 24 hours ending 01/04/21 1954     Ventilator Settings:  Mode Rate Tidal Volume Pressure FiO2 PEEP                    Peak airway pressure:      Minute ventilation:        Imaging:    Results from Hospital Encounter encounter on 01/04/21    XR ELBOW LT MIN 3 V    Narrative  Indication: Fell.    Impression  IMPRESSION: 3 views of  the left elbow reveal no fracture, dislocation or foreign  body.      Results from [place] encounter on 01/04/21    CT SPINE CERV WO CONT    Narrative  Indication: To fall today, rule out acute fracture dislocation of the cervical  spine.    Impression  IMPRESSION: Mild degenerative changes. No fracture.    Comment: CT images of the cervical spine were obtained without intravenous  contrast. No prior studies for comparison.    No fracture or dislocation.    There are mild degenerative changes about the atlantoaxial joint. Mild chronic  discogenic disease is  present C5-6. Small osteophyte disc complex produces  minimal spinal stenosis. Vertebral body stature alignment and intervertebral  disc space heights are preserved throughout. Facet and uncovertebral joints are  intact.    All CT exams at this facility use one or more dose reduction techniques  including automatic exposure control, mA/kV adjustment per patient's size, or  iterative reconstruction technique.      [x] See my orders for details    My assessment, plan of care, findings, medications, side effects etc were discussed with:  [x] nursing [] PT/OT    [x] respiratory therapy [x] Dr. Thurnell Garbe   [] family [x] Patient     Thank you for asking [place] to assist in this patient's care. We will follow along with you.    Leta Jungling, AGACNP-BC  Critical Care Nurse Practitioner  Pager: [phone]  January 04, 2021  7:54 PM      Dragon medical dictation software was used for portions of this report. Unintended voice transcription errors may have occurred.    I have reviewed the patient's chart. I saw and examined the patient, personally performed the critical or key portions of the service and discussed the management with the treatment team. I reviewed the NP's note and concur with the history, findings and plan of care, which include's my edits and my assessment and plan. Laboratory, hemodynamic and radiological data were reviewed.      Murriel Hopper MD

## 2021-01-04 NOTE — Consults (Signed)
Neurosurgery consult:  Cc: left chronic hygroma vs chronic hematoma  Hs: 86 year old female seen feb 1,2022 by dr. Su Ley after being found down without any memory of events. Ct at this time showed bilateral small subdurals. Observed x 48 hours then sent home  Lives with her son.  Apparently fell x 2 today striking her head  Follow up head ct shows enlarged subarachnoid space on the left side and preserved gyral sulcal pattern  Exam: left frontal cephalohematoma  Mental status orients x 2 with some help  Cranial nerves: 2-12 no focal  Motor: symmetric strength  Sens: touch and pp normal  Reflexes: 1-2 and symmetric toes go down  A; chronic apprearing lesion left side likely hygroma  P; to discuss with Dr. Su Ley      icu admission     Consider middle meningeal embolization      Admit to medcial team      Will speak with son tomorrow

## 2021-01-04 NOTE — Consults (Signed)
[facility]  CONSULTATION REPORT  NAME:  Beverly Nguyen, Beverly Nguyen  SEX:   F  ADMIT: 01/04/2021  DATE OF CONSULT: 01/04/2021  REFERRING PHYSICIAN:   DOB: 08-14-1935  MR#    [id number]  ROOM:  I413  ACCT#  [id number]    cc: Taqwa Deem MD      CHIEF COMPLAINT:    Left subdural hygroma.     HISTORY:    The patient is an 86 year old female who was hospitalized the 1st and 2nd of February of this year.  The patient again was found down.  The details of her syncope was not noted and a CAT scan did show a small subdural on the right side and a repeat scan showed some clearing.  The patient apparently remained stable, but again fell twice again today bumping her head. Because of the nature of injury, she was reevaluated in the ED.  Repeat CAT scan showed now an enlarged left subarachnoid space and definitionally this is likely a hygroma.  There is good gyral sulcal pattern.  There is some shift of the midline.  There are no signs of brain herniation.  Her cervical spine is sore, but she has fairly good range of motion. No fracture or dislocation was noted.     PHYSICAL EXAMINATION:    A very elderly female with a bruise on her left forehead.  She is awake.  She is alert and she is oriented times 2; with some help, she can get the year. The patient does not recall the details of what happened today.  She does note has been having some headaches.  The patient has a supple cervical spine.     She is awake.  She follows simple commands.  Her cranial nerves II-XII are normal.  Her strength is normal.  Sensory exam is intact and reflexes are symmetric.     ASSESSMENT:    At this point in time, the patient is presenting with an enlarging subarachnoid space on the left side.  She is certainly minimally symptomatic from this.  We will check coags and repeat her scan tomorrow.  Certainly, I think it would be wise to observe this for the short term. Dr. Noel Gerold did see her when she was here several weeks ago and I will discuss the  case with him.  She might be a candidate for middle meningeal embolization procedure by Dr. Valrie Hart.  She certainly is not in any extremis right now.  I will start her on some Decadron and Keppra and I will speak with her son when he is available.      ___________________  Buck Mam MD   Dictated SE:GBTDV C. Tenise Stetler, MD  [place]  D: 01/04/2021 76:16:07  T: 01/05/2021 07:35:31  [id number]

## 2021-01-04 NOTE — ED Provider Notes (Signed)
[facility]  Emergency Department Treatment Report    Patient: Beverly Nguyen Age: 86 y.o. Sex: female    Date of Birth: 1935-07-06 Admit Date: 01/04/2021 PCP: None   MRN: [id number]  CSN: [id number]     Room: H16/H16 Time Dictated: 6:04 PM      Payor: [place] / Plan: [facility] MEDICARE A & B / Product Type: Medicare /     Chief Complaint   Chief Complaint   Patient presents with   ??? Fall   ??? Head Injury       History of Present Illness   86 y.o. female with history of dementia, arthritis, hypertension, high cholesterol, and thyroid disorder presents via ambulance for report of fall at home.  EMS reported that the patient came from home where she reportedly was noted to have 2 episodes of falling throughout the day.  Patient noted to have a history of dementia and frequent falls.  Patient not noted to be on any anticoagulation.  Patient complains of some frontal headache where she is noted to have significant bruising.  Patient denies any associated neck pain.  Patient denies any chest pain or irregular heart rate.    Additional history from the patient is severely limited due to her underlying dementia.    Review of Systems   Review of Systems   Unable to perform ROS: Dementia   Respiratory: Negative for shortness of breath.    Cardiovascular: Negative for chest pain and palpitations.   Gastrointestinal: Negative for abdominal pain, nausea and vomiting.   Musculoskeletal: Positive for back pain (lower back pain) and joint pain (left shoulder and elbow pain). Negative for neck pain.   Neurological: Positive for headaches (left frontal). Negative for dizziness.     Past Medical/Surgical History     Past Medical History:   Diagnosis Date   ??? Arthritis    ??? Dementia (HCC)    ??? High cholesterol    ??? Hypertension    ??? S/P hip replacement    ??? Thyroid disorder      History reviewed. No pertinent surgical history.    Social History     Social History     Socioeconomic History   ??? Marital status: UNKNOWN    Tobacco Use   ??? Smoking status: Former Smoker   ??? Smokeless tobacco: Never Used   ??? Tobacco comment: dementia   Substance and Sexual Activity   ??? Alcohol use: Never   ??? Drug use: Never       Family History     Family History   Family history unknown: Yes       Current Medications     Prior to Admission Medications   Prescriptions Last Dose Informant Patient Reported? Taking?   aspirin 81 mg chewable tablet   Yes Yes   Sig: Take 81 mg by mouth daily.   citalopram (CELEXA) 20 mg tablet   Yes Yes   Sig: Take 20 mg by mouth daily.   donepeziL (Aricept) 10 mg tablet   Yes Yes   Sig: Take 10 mg by mouth nightly.   ibandronate (Boniva) 150 mg tablet   Yes Yes   Sig: Take 150 mg by mouth every thirty (30) days.   levothyroxine (synthroid) 25 mcg tablet   Yes Yes   Sig: Take 25 mcg by mouth Daily (before breakfast).   loperamide-simethicone (Imodium Multi-Symptom Relief) 2-125 mg tab   Yes Yes   Sig: Take  by mouth.  megestroL (MEGACE) 400 mg/10 mL (40 mg/mL) suspension   Yes No   Sig: Take 200 mg by mouth daily.   memantine (Namenda) 10 mg tablet   Yes Yes   Sig: Take 10 mg by mouth two (2) times a day.   metoprolol tartrate (LOPRESSOR) 50 mg tablet   No Yes   Sig: Take 0.5 Tablets by mouth daily.   Patient taking differently: Take 50 mg by mouth two (2) times a day.   metoprolol tartrate (LOPRESSOR) 50 mg tablet   Yes Yes   Sig: Take 25 mg by mouth daily.   pravastatin (PravachoL) 80 mg tablet   Yes Yes   Sig: Take 80 mg by mouth daily.      Facility-Administered Medications: None       Allergies     Allergies   Allergen Reactions   ??? Pcn [Penicillins] Hives       Physical Exam     ED Triage Vitals   ED Encounter Vitals Group      BP 01/04/21 1731 (!) 149/60      Pulse (Heart Rate) 01/04/21 1731 (!) 57      Resp Rate 01/04/21 1731 16      Temp 01/04/21 1801 97.8 ??F (36.6 ??C)      Temp src --       O2 Sat (%) 01/04/21 1731 97 %      Weight --       Height --        I have reviewed documented vital signs, which are within  age-appropriate parameters.   I have reviewed nursing documentation.    Physical Exam  Vitals and nursing note reviewed.   Constitutional:       General: She is in acute distress.      Comments: Frail elderly female who appears mildly uncomfortable.   HENT:      Head:      Comments: Patient noted to have a large area of ecchymosis and swelling to the left anterior forehead.     Right Ear: External ear normal.      Left Ear: External ear normal.      Nose: Nose normal.      Mouth/Throat:      Mouth: Mucous membranes are moist.      Pharynx: Oropharynx is clear.   Eyes:      Extraocular Movements: Extraocular movements intact.      Conjunctiva/sclera: Conjunctivae normal.      Pupils: Pupils are equal, round, and reactive to light.   Cardiovascular:      Rate and Rhythm: Normal rate and regular rhythm.      Heart sounds: No murmur heard.      Pulmonary:      Effort: Pulmonary effort is normal. No respiratory distress.      Breath sounds: Normal breath sounds.   Abdominal:      General: Bowel sounds are normal. There is no distension.      Palpations: Abdomen is soft.      Tenderness: There is no abdominal tenderness.   Musculoskeletal:         General: Tenderness and signs of injury present.      Cervical back: No tenderness.      Right lower leg: No edema.      Left lower leg: No edema.      Comments: Patient noted to have tenderness in the left shoulder and elbow.  Patient noted to have a skin tear  on the left elbow without significant active bleeding.  Patient noted to have scattered ecchymosis to the bilateral upper and lower extremities.   Skin:     General: Skin is warm and dry.      Findings: Bruising present. No rash.   Neurological:      Mental Status: She is alert.      Cranial Nerves: No cranial nerve deficit.      Sensory: No sensory deficit.      Motor: No weakness.      Comments: Patient is alert and oriented to person and place only.  She believes that "Danae Orleans" is the president and that it is "October  2002."   Psychiatric:         Mood and Affect: Mood is anxious (mildly).         Behavior: Behavior normal.         Cognition and Memory: She exhibits impaired recent memory and impaired remote memory.       Diagnostic Studies   Lab:   Recent Results (from the past 12 hour(s))   CBC WITH AUTOMATED DIFF    Collection Time: 01/04/21  7:40 PM   Result Value Ref Range    WBC 8.5 4.0 - 11.0 1000/mm3    RBC 4.01 3.60 - 5.20 M/uL    HGB 11.4 (L) 13.0 - 17.2 gm/dl    HCT 61.4 (L) 43.1 - 50.0 %    MCV 88.8 80.0 - 98.0 fL    MCH 28.4 25.4 - 34.6 pg    MCHC 32.0 30.0 - 36.0 gm/dl    PLATELET 540 086 - 761 1000/mm3    MPV 9.5 6.0 - 10.0 fL    RDW-SD 45.2 36.4 - 46.3      NRBC 0 0 - 0      IMMATURE GRANULOCYTES 0.4 0.0 - 3.0 %    NEUTROPHILS 73.2 (H) 34 - 64 %    LYMPHOCYTES 16.7 (L) 28 - 48 %    MONOCYTES 8.5 1 - 13 %    EOSINOPHILS 0.7 0 - 5 %    BASOPHILS 0.5 0 - 3 %   METABOLIC PANEL, BASIC    Collection Time: 01/04/21  7:40 PM   Result Value Ref Range    Sodium 133 (L) 136 - 145 mEq/L    Potassium 3.8 3.5 - 5.1 mEq/L    Chloride 100 98 - 107 mEq/L    CO2 25 21 - 32 mEq/L    Glucose 105 74 - 106 mg/dl    BUN 12 7 - 25 mg/dl    Creatinine 1.4 (H) 0.6 - 1.3 mg/dl    GFR est AA 95.0      GFR est non-AA 38      Calcium 9.3 8.5 - 10.1 mg/dl    Anion gap 8 5 - 15 mmol/L   PROTHROMBIN TIME + INR    Collection Time: 01/04/21  7:40 PM   Result Value Ref Range    Prothrombin time 13.3 (H) 10.2 - 12.9 seconds    INR 1.1 0.1 - 1.1     PTT    Collection Time: 01/04/21  7:40 PM   Result Value Ref Range    aPTT 32.7 25.1 - 36.5 seconds     Based on my interpretation the CBC shows mild anemia without other significant acute abnormalities.  Based on my interpretation the coagulation panel is notable for a mild increase in the prothrombin time without other significant acute abnormalities.  Based on my interpretation the BMP shows mild hyponatremia and elevation of the BUN that are consistent with previous values without other significant  acute abnormalities.  I have reviewed all ordered labs and used them in my medical decision making (MDM) as documented below.    Imaging:    XR SHOULDER LT AP/LAT MIN 2 V    Result Date: 01/04/2021  Indication: Left shoulder pain after fall.     IMPRESSION: 2 views of the left shoulder reveal nondisplaced fracture through the distal clavicle. There is old left sixth rib trauma. Remainder of the examination is unremarkable.     XR ELBOW LT MIN 3 V    Result Date: 01/04/2021  Indication: Larey Seat.     IMPRESSION: 3 views of the left elbow reveal no fracture, dislocation or foreign body.     CT HEAD WO CONT    Addendum Date: 01/04/2021    History: Fall with evidence of hematoma, second fall today, not on anticoagulation, rule out intracranial processes Impression: New isodense left subdural hematoma with 5.8 mm midline shift to the right. Left frontal scalp hematoma. No fracture. Comment: CT images of the head were obtained without intravenous contrast. This was compared with December 16, 2020 and December 15, 2020. There is diffuse atrophy and small vessel disease. Left isodense subdural hematoma has developed since February 1, 202214 measuring mm in greatest thickness. There is no midline shift to the right by 5.8 mm.. Prominent left frontal scalp hematoma is noted without underlying bony fracture. This was discussed with Dr. Clarene Duke in the emergency room at 7:21 PM January 04, 2021. All CT exams at this facility use one or more dose reduction techniques including automatic exposure control, mA/kV adjustment per patient's size, or iterative reconstruction technique.     Result Date: 01/04/2021  History: Fall with evidence of hematoma, second fall today, not on anticoagulation, rule out intracranial processes     Impression: New isodense left subdural hematoma with 5.8 mm midline shift to the right. Comment: CT images of the head were obtained without intravenous contrast. This was compared with December 16, 2020 and December 15, 2020. There is diffuse atrophy and small vessel disease. Left isodense subdural hematoma has developed since February 1, 202214 measuring mm in greatest thickness. There is no midline shift to the right by 5.8 mm. This was discussed with Dr. Clarene Duke in the emergency room at 7:21 PM January 04, 2021. All CT exams at this facility use one or more dose reduction techniques including automatic exposure control, mA/kV adjustment per patient's size, or iterative reconstruction technique.     CT SPINE CERV WO CONT    Result Date: 01/04/2021  Indication: To fall today, rule out acute fracture dislocation of the cervical spine.      IMPRESSION: Mild degenerative changes. No fracture. Comment: CT images of the cervical spine were obtained without intravenous contrast. No prior studies for comparison. No fracture or dislocation. There are mild degenerative changes about the atlantoaxial joint. Mild chronic discogenic disease is present C5-6. Small osteophyte disc complex produces minimal spinal stenosis. Vertebral body stature alignment and intervertebral disc space heights are preserved throughout. Facet and uncovertebral joints are intact. All CT exams at this facility use one or more dose reduction techniques including automatic exposure control, mA/kV adjustment per patient's size, or iterative reconstruction technique.     Based on my interpretation the CT scan of the head without contrast shows evidence of left-sided subdural hematoma with evidence of mild right-sided midline  shift of 5 mm.  Based on my interpretation CT scans of the cervical spine showed degenerative changes without evidence of acute fracture or dislocation.  Based on my interpretation the plain films of the left elbow showed no clear evidence of acute fracture.  Based on my interpretation the plain films of the left shoulder show evidence of nondisplaced distal clavicle fracture without other evidence of significant injury.    I have reviewed all  ordered imaging studies and used them in my medical decision making (MDM) as documented below.    Cardiac Monitor Rhythm Strip Interpretation     Ordered in this patient due to fall and head injury.  My interpretation is that the cardiac monitor shows normal sinus rhythm at a rate of 68 beats per minute.    Johny Shock, DO  January 04, 2021    EKG Interpretation    An EKG was obtained in the evaluation of this patient at 1921, and read at 1922.  I have personally read and interpreted this EKG as follows:    EKG demonstrates normal sinus rhythm at a rate of 66 beats per minute with a normal axis. PR interval is normal at 162 ms.  QRS interval is normal at 82 ms. QT interval is prolonged at 484 ms with a prolonged corrected QT interval of 507 ms. There are no ST segment or T wave changes suggestive of acute ischemia or infarction.      Johny Shock, DO  January 04, 2021    I have reviewed all ordered EKGs and used them in my medical decision making (MDM) as documented below.    Progress Notes     7:21 PM  Updated by Dr. Melvyn Neth of radiology of the patient having evidence of subdural hematoma in the left side with rightward midline shift.  Reassessed patient who is still noted to be resting comfortably with no evidence of focal neurologic deficits.  Discussed moving the patient to a monitored bed and elevating the head of the bed to 30 degrees.  Discussed plan to reach out to neurosurgery for further recommendations and possible intervention.    7:35 PM  Case discussed with Dr. Byrd Hesselbach of neurosurgery who recommended against initiating treatment with mannitol, hypertonic saline, or other aggressive interventions as he feels that this may be a chronic finding.  We discussed.    7:43 PM  Case discussed with Dr. Corinda Gubler who agreed with planned admission to ICU with plan for follow-up on pending labs before posting the patient to ensure that she does not have any evidence of significant coagulopathy or other  indications for reversal.    7:50 PM  Case discussed with NP Oval Linsey of the ICU team with plan to admit the patient to the ICU for further management and follow-up with recommendations from neurosurgery for possible intervention.    8:58 PM  Spoke to the patient's son Nykerria Macconnell who states that the patient would not want aggressive resuscitation, intubation or CPR if her heart were to stop beating.  We discussed that Dr. Byrd Hesselbach of neurosurgery will be calling him in the morning as the next of kin to discuss further management of her noted subdural hematoma that is likely more chronic in nature.    9:20 PM  Discussed case with Dr. Delford Field of orthopedics who agrees that the patient can be placed in a sling for comfort but there is nothing emergently surgical about her distal clavicle fracture.  He agreed to see her in the morning  and requested that we place him on the treatment team.    Medical Decision Making   Chief complaint: Fall, Head Injury    86 y.o. female with fall with history and exam consistent with likely acute on chronic subdural hematoma with midline shift.    Initial considerations in this patient included intracranial hemorrhages including subdural hematoma, epidural hematoma, subarachnoid hemorrhage, cervical spine injuries, skull fracture, facial bone fracture, fractures of the left shoulder, humerus, elbow, and ribs, and other infectious and metabolic etiologies as well as cardiac etiologies for fall including syncope and dysrhythmia among others.    Patient presented with report of 2 falls at home today that were reportedly unwitnessed.  Patient noted to have a history of dementia with a history of previous falls and recent admission with evidence of subdural hematoma.  Patient noted to have pain in the left upper extremity without obvious deformity or open wound.  Patient noted to have no clear evidence of focal neurologic deficits.  Patient reported mild left frontal headache in the setting of  this fall where she is noted to have significant bruising.  CT scan obtained with evidence of subdural hematoma on the left side with evidence of 5 mm rightward shift.  Patient noted to have no evidence of significant uncontrolled hypertension at time of ED evaluation.  Patient noted to be hemodynamically stable.    Case discussed with Dr. Byrd HesselbachWaters of neurosurgery who evaluated the patient and his CT scans and felt that the patient's subdural hematoma was likely more chronic in nature.  He felt that there was no indication for emergent surgical intervention at this time.  We discussed that we are reaching out to the son to discuss goals of care.  He agreed to see the patient in the morning and recommended repeat CT scan in the morning to evaluate for progression of subdural hematoma.    Case discussed with NP Oval LinseyForonda of the ICU team who evaluated the patient and agreed with plan for admission to the ICU for frequent neurologic checks and follow-up with neurosurgery's recommendations.    Case discussed with Dr. Delford FieldWright of orthopedics who felt that the patient's distal clavicle fracture without significant displacement did not require any emergent surgical intervention.    Case discussed with Dr. Corinda GublerVerma who agreed with planned admission to the ICU for frequent neurologic checks and follow-up by neurosurgery for further decisions regarding intervention.  Planned admission was discussed with the patient's son who demonstrated understanding and agreement with this plan.  He noted that despite the patient not having a DNR on file, she did not want to be intubated or have aggressive resuscitative measures should she go into cardiac arrest during hospitalization.    Medications   levETIRAcetam (KEPPRA) 500 mg in saline (iso-osm) 100 mL IVPB (premix) (has no administration in time range)   dexamethasone (DECADRON) 4 mg/mL injection 4 mg (has no administration in time range)   aspirin chewable tablet 81 mg ( Oral Automatically  Held 01/19/21 0900)   citalopram (CELEXA) tablet 20 mg (has no administration in time range)   donepeziL (ARICEPT) tablet 10 mg (has no administration in time range)   levothyroxine (SYNTHROID) tablet 25 mcg (has no administration in time range)   metoprolol tartrate (LOPRESSOR) tablet 25 mg (has no administration in time range)   pravastatin (PRAVACHOL) tablet 80 mg (has no administration in time range)   acetaminophen (TYLENOL) tablet 650 mg (has no administration in time range)     Or  acetaminophen (TYLENOL) solution 650 mg (has no administration in time range)     Or   acetaminophen (TYLENOL) suppository 650 mg (has no administration in time range)   ondansetron (ZOFRAN) injection 4 mg (has no administration in time range)   PHOSPHATE ELECTROLYTE REPLACEMENT PROTOCOL STANDARD DOSING (has no administration in time range)   MAGNESIUM ELECTROLYTE REPLACEMENT PROTOCOL STANDARD DOSING (has no administration in time range)   POTASSIUM ELECTROLYTE REPLACEMENT PROTOCOL STANDARD DOSING (has no administration in time range)   CALCIUM ELECTROLYTE REPLACEMENT PROTOCOL STANDARD DOSING (has no administration in time range)   pantoprazole (PROTONIX) 40 mg in 0.9% sodium chloride 10 mL injection (has no administration in time range)   sodium chloride (NS) flush 5-10 mL (10 mL IntraVENous Given 01/04/21 1940)       Critical Care Note     Upon my evaluation, this patient was felt to have a high probability of imminent or life-threatening deterioration due to traumatic subdural hematoma in setting of fall with evidence of right midline shift, which required my direct attention, intervention, and personal management.    I have personally provided 45 minutes of critical care time exclusive of time spent on separately billable procedures. Time includes review of laboratory data, radiology results, discussion with consultants, and monitoring for potential decompensation. Interventions were performed as documented above.    Johny Shock, DO  January 04, 2021    Final Diagnosis       ICD-10-CM ICD-9-CM   1. Subdural hematoma (HCC)  S06.5X9A 432.1   2. Dementia without behavioral disturbance, unspecified dementia type (HCC)  F03.90 294.20   3. Fall from ground level  W18.30XA E888.9   4. Traumatic closed fracture of distal clavicle with minimal displacement, left, initial encounter  S42.032A 810.03   5. Skin tear of left elbow without complication, initial encounter  S51.012A 881.01       Disposition   Admitted to the ICU for traumatic subdural hematoma in setting of ground-level fall.           Johny Shock, DO  January 04, 2021    My signature above authenticates this document and my orders, the final    diagnosis (es), discharge prescription (s), and instructions in the Epic    record.  If you have any questions please contact [phone].     Nursing notes have been reviewed by the physician/ advanced practice    Clinician.    Dragon medical dictation software was used for portions of this report. Unintended voice recognition grammatical errors may occur.

## 2021-01-04 NOTE — ED Notes (Signed)
Lab aware of add on labs

## 2021-01-04 NOTE — ED Notes (Signed)
 TRANSFER - OUT REPORT:    Verbal report given to [facility]) on Beverly Nguyen  being transferred to ICU 4 bed 14(unit) for routine progression of care       Report consisted of patient's Situation, Background, Assessment and   Recommendations(SBAR).     Information from the following report(s) SBAR was reviewed with the receiving nurse.    Lines:   Peripheral IV 01/04/21 Right Antecubital (Active)   Site Assessment Clean, dry, & intact 01/04/21 1940   Phlebitis Assessment 0 01/04/21 1940   Infiltration Assessment 0 01/04/21 1940   Dressing Status Clean, dry, & intact 01/04/21 1940   Dressing Type Transparent 01/04/21 1940   Alcohol Cap Used Yes 01/04/21 1940        Opportunity for questions and clarification was provided.      Patient transported with:   Monitor  Registered Nurse

## 2021-01-04 NOTE — ED Notes (Signed)
Patient presents to ED by EMS from home for evaluation of head injury s/p fall. Patient with large hematoma to L side of forehead. No LOC. Patient is not on any anticoagulants. Patient also has a skin tear to her L arm. This is patient's second fall today.

## 2021-01-04 NOTE — ED Notes (Signed)
Assumed care of pt from previous nurse    Pt is alert and answering questions.  Pt states she lives at home by herself which she has people that come over to check on her. Pt states she fell but she cant remember when.  Pt has hematoma to the L side of the head.  Pt also has abrasion to the L elbow which is currently wrapped.     HOB raised.  IV placed, blood drawn, sent to lab.  Placed on CM and VS updated.  Lights also dimmed for comfort.

## 2021-01-05 ENCOUNTER — Inpatient Hospital Stay: Admit: 2021-01-05 | Payer: MEDICARE

## 2021-01-05 LAB — C REACTIVE PROTEIN, QT: C-Reactive protein: 80.4 mg/L — ABNORMAL HIGH (ref 0.0–2.9)

## 2021-01-05 LAB — CBC WITH AUTOMATED DIFF
BASOPHILS: 0.5 % (ref 0–3)
BASOPHILS: 0.4 % (ref 0–3)
EOSINOPHILS: 0.7 % (ref 0–5)
EOSINOPHILS: 0.1 % (ref 0–5)
HCT: 35.6 % — ABNORMAL LOW (ref 37.0–50.0)
HCT: 32.6 % — ABNORMAL LOW (ref 37.0–50.0)
HGB: 11.4 gm/dl — ABNORMAL LOW (ref 13.0–17.2)
HGB: 10.5 gm/dl — ABNORMAL LOW (ref 13.0–17.2)
IMMATURE GRANULOCYTES: 0.4 % (ref 0.0–3.0)
IMMATURE GRANULOCYTES: 0.5 % (ref 0.0–3.0)
LYMPHOCYTES: 16.7 % — ABNORMAL LOW (ref 28–48)
LYMPHOCYTES: 9.6 % — ABNORMAL LOW (ref 28–48)
MCH: 28.4 pg (ref 25.4–34.6)
MCH: 28.5 pg (ref 25.4–34.6)
MCHC: 32 gm/dl (ref 30.0–36.0)
MCHC: 32.2 gm/dl (ref 30.0–36.0)
MCV: 88.8 fL (ref 80.0–98.0)
MCV: 88.3 fL (ref 80.0–98.0)
MONOCYTES: 8.5 % (ref 1–13)
MONOCYTES: 1.9 % (ref 1–13)
MPV: 9.5 fL (ref 6.0–10.0)
MPV: 9.5 fL (ref 6.0–10.0)
NEUTROPHILS: 73.2 % — ABNORMAL HIGH (ref 34–64)
NEUTROPHILS: 87.5 % — ABNORMAL HIGH (ref 34–64)
NRBC: 0 (ref 0–0)
NRBC: 0 (ref 0–0)
PLATELET: 342 10*3/uL (ref 140–450)
PLATELET: 295 10*3/uL (ref 140–450)
RBC: 4.01 M/uL (ref 3.60–5.20)
RBC: 3.69 M/uL (ref 3.60–5.20)
RDW-SD: 45.2 (ref 36.4–46.3)
RDW-SD: 45 (ref 36.4–46.3)
WBC: 8.5 10*3/uL (ref 4.0–11.0)
WBC: 7.3 10*3/uL (ref 4.0–11.0)

## 2021-01-05 LAB — T4 (THYROXINE): T4, Total: 9.7 ug/dL (ref 4.8–13.9)

## 2021-01-05 LAB — METABOLIC PANEL, COMPREHENSIVE
ALT (SGPT): 10 U/L — ABNORMAL LOW (ref 12–78)
AST (SGOT): 17 U/L (ref 15–37)
Albumin: 2.2 gm/dl — ABNORMAL LOW (ref 3.4–5.0)
Alk. phosphatase: 121 U/L — ABNORMAL HIGH (ref 45–117)
Anion gap: 8 mmol/L (ref 5–15)
BUN: 12 mg/dl (ref 7–25)
Bilirubin, total: 0.4 mg/dl (ref 0.2–1.0)
CO2: 22 mEq/L (ref 21–32)
Calcium: 8.8 mg/dl (ref 8.5–10.1)
Chloride: 105 mEq/L (ref 98–107)
Creatinine: 1.3 mg/dl (ref 0.6–1.3)
GFR est AA: 50
GFR est non-AA: 42
Glucose: 124 mg/dl — ABNORMAL HIGH (ref 74–106)
Potassium: 3.7 mEq/L (ref 3.5–5.1)
Protein, total: 6.9 gm/dl (ref 6.4–8.2)
Sodium: 136 mEq/L (ref 136–145)

## 2021-01-05 LAB — URINALYSIS W/ RFLX MICROSCOPIC
Bilirubin, Urine: NEGATIVE
Bilirubin: NEGATIVE
Blood, Urine: NEGATIVE
Blood: NEGATIVE
Glucose, Ur: NEGATIVE mg/dl
Glucose: NEGATIVE mg/dl
Leukocyte Esterase, Urine: NEGATIVE
Leukocyte Esterase: NEGATIVE
Nitrite, Urine: NEGATIVE
Nitrites: NEGATIVE
Protein, UA: NEGATIVE mg/dl
Protein: NEGATIVE mg/dl
Specific Gravity, UA: 1.015 (ref 1.005–1.030)
Specific gravity: 1.015 (ref 1.005–1.030)
Urobilinogen, UA, POCT: 0.2 mg/dl (ref 0.0–1.0)
Urobilinogen: 0.2 mg/dl (ref 0.0–1.0)
pH (UA): 6 (ref 5.0–9.0)
pH, UA: 6 (ref 5.0–9.0)

## 2021-01-05 LAB — METABOLIC PANEL, BASIC
Anion gap: 8 mmol/L (ref 5–15)
BUN: 12 mg/dl (ref 7–25)
CO2: 25 mEq/L (ref 21–32)
Calcium: 9.3 mg/dl (ref 8.5–10.1)
Chloride: 100 mEq/L (ref 98–107)
Creatinine: 1.4 mg/dl — ABNORMAL HIGH (ref 0.6–1.3)
GFR est AA: 46
GFR est non-AA: 38
Glucose: 105 mg/dl (ref 74–106)
Potassium: 3.8 mEq/L (ref 3.5–5.1)
Sodium: 133 mEq/L — ABNORMAL LOW (ref 136–145)

## 2021-01-05 LAB — EKG, 12 LEAD, INITIAL
Atrial Rate: 66 {beats}/min
Calculated P Axis: 79 degrees
Calculated R Axis: 41 degrees
Calculated T Axis: 68 degrees
Diagnosis: NORMAL
P-R Interval: 162 ms
Q-T Interval: 484 ms
QRS Duration: 82 ms
QTC Calculation (Bezet): 507 ms
Ventricular Rate: 66 {beats}/min

## 2021-01-05 LAB — LIPID PANEL
CHOL/HDL Ratio: 4.9 Ratio — ABNORMAL HIGH (ref 0.0–4.4)
Chol/HDL Ratio: 4.9 Ratio — ABNORMAL HIGH (ref 0.0–4.4)
Cholesterol, Total: 162 mg/dl (ref 140–199)
Cholesterol, total: 162 mg/dl (ref 140–199)
HDL Cholesterol: 33 mg/dl — ABNORMAL LOW (ref 40–96)
HDL: 33 mg/dl — ABNORMAL LOW (ref 40–96)
LDL Calculated: 99 mg/dl (ref 0–130)
LDL, calculated: 99 mg/dl (ref 0–130)
Triglyceride: 150 mg/dl (ref 29–150)
Triglycerides: 150 mg/dl (ref 29–150)

## 2021-01-05 LAB — POTASSIUM
Potassium: 4.1 mEq/L (ref 3.5–5.1)
Potassium: 4.1 mEq/L (ref 3.5–5.1)

## 2021-01-05 LAB — GLUCOSE, POC
Glucose (POC): 94 mg/dL (ref 65–105)
Glucose (POC): 268 mg/dL — ABNORMAL HIGH (ref 65–105)

## 2021-01-05 LAB — TSH 3RD GENERATION
TSH: 6.28 u[IU]/mL — ABNORMAL HIGH (ref 0.358–3.740)
TSH: 6.28 u[IU]/mL — ABNORMAL HIGH (ref 0.358–3.740)

## 2021-01-05 LAB — PTT: aPTT: 32.7 seconds (ref 25.1–36.5)

## 2021-01-05 LAB — PROTHROMBIN TIME + INR
INR: 1.1 (ref 0.1–1.1)
Prothrombin time: 13.3 seconds — ABNORMAL HIGH (ref 10.2–12.9)

## 2021-01-05 LAB — FIBRINOGEN
Fibrinogen: 608 mg/dl — ABNORMAL HIGH (ref 220–397)
Fibrinogen: 608 mg/dl — ABNORMAL HIGH (ref 220–397)

## 2021-01-05 LAB — TROPONIN-HIGH SENSITIVITY: Troponin-High Sensitivity: 10 ng/L (ref 0–59)

## 2021-01-05 LAB — SED RATE (ESR): Sed rate (ESR): 90 mm/Hr — ABNORMAL HIGH (ref 0–30)

## 2021-01-05 LAB — CORTISOL
Cortisol, random: 8.04 ug/dL
Cortisol: 8.04 ug/dL

## 2021-01-05 LAB — T3, FREE
T3, Free: 2.29 pg/ml (ref 2.18–3.98)
Triiodothyronine (T3), free: 2.29 pg/ml (ref 2.18–3.98)

## 2021-01-05 LAB — COMPREHENSIVE METABOLIC PANEL
ALT: 10 U/L — ABNORMAL LOW (ref 12–78)
AST: 17 U/L (ref 15–37)
Albumin: 2.2 gm/dl — ABNORMAL LOW (ref 3.4–5.0)
Alkaline Phosphatase: 121 U/L — ABNORMAL HIGH (ref 45–117)
Anion Gap: 8 mmol/L (ref 5–15)
BUN: 12 mg/dl (ref 7–25)
CO2: 22 mEq/L (ref 21–32)
Calcium: 8.8 mg/dl (ref 8.5–10.1)
Chloride: 105 mEq/L (ref 98–107)
Creatinine: 1.3 mg/dl (ref 0.6–1.3)
GFR African American: 50
Glucose: 124 mg/dl — ABNORMAL HIGH (ref 74–106)
Potassium: 3.7 mEq/L (ref 3.5–5.1)
Sodium: 136 mEq/L (ref 136–145)
Total Bilirubin: 0.4 mg/dl (ref 0.2–1.0)
Total Protein: 6.9 gm/dl (ref 6.4–8.2)
eGFR NON-AA: 42

## 2021-01-05 LAB — PROTIME-INR
INR: 1.1 (ref 0.1–1.1)
Protime: 13.3 seconds — ABNORMAL HIGH (ref 10.2–12.9)

## 2021-01-05 LAB — CBC WITH AUTO DIFFERENTIAL
Basophils %: 0.5 % (ref 0–3)
Basophils %: 0.4 % (ref 0–3)
Eosinophils %: 0.7 % (ref 0–5)
Eosinophils %: 0.1 % (ref 0–5)
Hematocrit: 35.6 % — ABNORMAL LOW (ref 37.0–50.0)
Hematocrit: 32.6 % — ABNORMAL LOW (ref 37.0–50.0)
Hemoglobin: 11.4 gm/dl — ABNORMAL LOW (ref 13.0–17.2)
Hemoglobin: 10.5 gm/dl — ABNORMAL LOW (ref 13.0–17.2)
Immature Granulocytes %: 0.4 % (ref 0.0–3.0)
Immature Granulocytes %: 0.5 % (ref 0.0–3.0)
Lymphocytes %: 16.7 % — ABNORMAL LOW (ref 28–48)
Lymphocytes %: 9.6 % — ABNORMAL LOW (ref 28–48)
MCH: 28.4 pg (ref 25.4–34.6)
MCH: 28.5 pg (ref 25.4–34.6)
MCHC: 32 gm/dl (ref 30.0–36.0)
MCHC: 32.2 gm/dl (ref 30.0–36.0)
MCV: 88.8 fL (ref 80.0–98.0)
MCV: 88.3 fL (ref 80.0–98.0)
MPV: 9.5 fL (ref 6.0–10.0)
MPV: 9.5 fL (ref 6.0–10.0)
Monocytes %: 8.5 % (ref 1–13)
Monocytes %: 1.9 % (ref 1–13)
Neutrophils %: 73.2 % — ABNORMAL HIGH (ref 34–64)
Neutrophils %: 87.5 % — ABNORMAL HIGH (ref 34–64)
Nucleated RBCs: 0 (ref 0–0)
Nucleated RBCs: 0 (ref 0–0)
Platelets: 342 10*3/uL (ref 140–450)
Platelets: 295 10*3/uL (ref 140–450)
RBC: 4.01 M/uL (ref 3.60–5.20)
RBC: 3.69 M/uL (ref 3.60–5.20)
RDW-SD: 45.2 (ref 36.4–46.3)
RDW-SD: 45 (ref 36.4–46.3)
WBC: 8.5 10*3/uL (ref 4.0–11.0)
WBC: 7.3 10*3/uL (ref 4.0–11.0)

## 2021-01-05 LAB — POCT GLUCOSE
POC Glucose: 94 mg/dL (ref 65–105)
POC Glucose: 268 mg/dL — ABNORMAL HIGH (ref 65–105)

## 2021-01-05 LAB — EKG 12-LEAD
Atrial Rate: 66 {beats}/min
Diagnosis: NORMAL
P Axis: 79 degrees
P-R Interval: 162 ms
Q-T Interval: 484 ms
QRS Duration: 82 ms
QTc Calculation (Bazett): 507 ms
R Axis: 41 degrees
T Axis: 68 degrees
Ventricular Rate: 66 {beats}/min

## 2021-01-05 LAB — SEDIMENTATION RATE: Sed Rate, Automated: 90 mm/h — ABNORMAL HIGH (ref 0–30)

## 2021-01-05 LAB — T4: T4, Total: 9.7 ug/dL (ref 4.8–13.9)

## 2021-01-05 LAB — BASIC METABOLIC PANEL
Anion Gap: 8 mmol/L (ref 5–15)
BUN: 12 mg/dl (ref 7–25)
CO2: 25 mEq/L (ref 21–32)
Calcium: 9.3 mg/dl (ref 8.5–10.1)
Chloride: 100 mEq/L (ref 98–107)
Creatinine: 1.4 mg/dl — ABNORMAL HIGH (ref 0.6–1.3)
GFR African American: 46
Glucose: 105 mg/dl (ref 74–106)
Potassium: 3.8 mEq/L (ref 3.5–5.1)
Sodium: 133 mEq/L — ABNORMAL LOW (ref 136–145)
eGFR NON-AA: 38

## 2021-01-05 LAB — TROPONIN, HIGH SENSITIVITY: Troponin, High Sensitivity: 10 ng/L (ref 0–59)

## 2021-01-05 LAB — C-REACTIVE PROTEIN: CRP: 80.4 mg/L — ABNORMAL HIGH (ref 0.0–2.9)

## 2021-01-05 LAB — APTT: aPTT: 32.7 seconds (ref 25.1–36.5)

## 2021-01-05 MED ORDER — METOPROLOL TARTRATE 25 MG TAB
25 mg | Freq: Every day | ORAL | Status: DC
Start: 2021-01-05 — End: 2021-01-13
  Administered 2021-01-05 – 2021-01-13 (×8): via ORAL

## 2021-01-05 MED ORDER — SODIUM CHLORIDE 0.9 % INJECTION
40 mg | Freq: Every day | INTRAMUSCULAR | Status: DC
Start: 2021-01-05 — End: 2021-01-13
  Administered 2021-01-05 – 2021-01-13 (×9): via INTRAVENOUS

## 2021-01-05 MED ORDER — SODIUM CHLORIDE 0.9 % IV
INTRAVENOUS | Status: DC
Start: 2021-01-05 — End: 2021-01-05
  Administered 2021-01-05 (×2): via INTRAVENOUS

## 2021-01-05 MED ORDER — ASPIRIN 81 MG CHEWABLE TAB
81 mg | Freq: Every day | ORAL | Status: DC
Start: 2021-01-05 — End: 2021-01-13

## 2021-01-05 MED ORDER — NICARDIPINE IN SODIUM CHLORIDE(ISO-OSMOTIC) 40 MG/200 ML IV PIGGY BACK
40 mg/200 mL (0.2 mg/mL) | INTRAVENOUS | Status: DC
Start: 2021-01-05 — End: 2021-01-06
  Administered 2021-01-05 – 2021-01-07 (×11): via INTRAVENOUS

## 2021-01-05 MED ORDER — NICARDIPINE 2.5 MG/ML IV
2510 mg/10 mL | INTRAVENOUS | Status: DC
Start: 2021-01-05 — End: 2021-01-04

## 2021-01-05 MED ORDER — PRAVASTATIN 40 MG TAB
40 mg | Freq: Every day | ORAL | Status: DC
Start: 2021-01-05 — End: 2021-01-13
  Administered 2021-01-05 – 2021-01-13 (×9): via ORAL

## 2021-01-05 MED ORDER — POTASSIUM CHLORIDE SR 10 MEQ TAB
10 mEq | Freq: Once | ORAL | Status: AC
Start: 2021-01-05 — End: 2021-01-05
  Administered 2021-01-05: 11:00:00 via ORAL

## 2021-01-05 MED ORDER — ACETAMINOPHEN (TYLENOL) SOLUTION 32MG/ML
Freq: Four times a day (QID) | ORAL | Status: DC | PRN
Start: 2021-01-05 — End: 2021-01-13
  Administered 2021-01-08: 20:00:00 via ORAL

## 2021-01-05 MED ORDER — ELECTROLYTE REPLACEMENT PROTOCOL
Status: DC | PRN
Start: 2021-01-05 — End: 2021-01-06

## 2021-01-05 MED ORDER — DEXAMETHASONE SODIUM PHOSPHATE 4 MG/ML IJ SOLN
4 mg/mL | Freq: Four times a day (QID) | INTRAMUSCULAR | Status: DC
Start: 2021-01-05 — End: 2021-01-06
  Administered 2021-01-05 – 2021-01-06 (×7): via INTRAVENOUS

## 2021-01-05 MED ORDER — SODIUM CHLORIDE 0.9 % IV
4 mg/mL | Freq: Four times a day (QID) | INTRAVENOUS | Status: DC
Start: 2021-01-05 — End: 2021-01-04

## 2021-01-05 MED ORDER — CITALOPRAM 20 MG TAB
20 mg | Freq: Every day | ORAL | Status: DC
Start: 2021-01-05 — End: 2021-01-13
  Administered 2021-01-05 – 2021-01-13 (×9): via ORAL

## 2021-01-05 MED ORDER — ACETAMINOPHEN 650 MG RECTAL SUPPOSITORY
650 mg | Freq: Four times a day (QID) | RECTAL | Status: DC | PRN
Start: 2021-01-05 — End: 2021-01-13

## 2021-01-05 MED ORDER — LEVETIRACETAM IN SOD CHLORIDE (ISO-OSM) 500 MG/100 ML IV PIGGY BACK
500 mg/100 mL | Freq: Two times a day (BID) | INTRAVENOUS | Status: DC
Start: 2021-01-05 — End: 2021-01-13
  Administered 2021-01-05 – 2021-01-13 (×18): via INTRAVENOUS

## 2021-01-05 MED ORDER — LEVOTHYROXINE 25 MCG TAB
25 mcg | Freq: Every day | ORAL | Status: DC
Start: 2021-01-05 — End: 2021-01-09
  Administered 2021-01-05 – 2021-01-09 (×5): via ORAL

## 2021-01-05 MED ORDER — ONDANSETRON (PF) 4 MG/2 ML INJECTION
4 mg/2 mL | Freq: Three times a day (TID) | INTRAMUSCULAR | Status: DC | PRN
Start: 2021-01-05 — End: 2021-01-13

## 2021-01-05 MED ORDER — DONEPEZIL 10 MG TAB
10 mg | Freq: Every evening | ORAL | Status: DC
Start: 2021-01-05 — End: 2021-01-13
  Administered 2021-01-05 – 2021-01-13 (×9): via ORAL

## 2021-01-05 MED ORDER — METOPROLOL TARTRATE 25 MG TAB
25 mg | Freq: Every day | ORAL | Status: DC
Start: 2021-01-05 — End: 2021-01-04

## 2021-01-05 MED ORDER — ELECTROLYTE REPLACEMENT PROTOCOL
Status: DC | PRN
Start: 2021-01-05 — End: 2021-01-08

## 2021-01-05 MED ORDER — ACETAMINOPHEN 325 MG TABLET
325 mg | Freq: Four times a day (QID) | ORAL | Status: DC | PRN
Start: 2021-01-05 — End: 2021-01-13
  Administered 2021-01-05 – 2021-01-06 (×2): via ORAL

## 2021-01-05 MED FILL — LEVETIRACETAM IN SOD CHLORIDE (ISO-OSM) 500 MG/100 ML IV PIGGY BACK: 500 mg/100 mL | INTRAVENOUS | Qty: 100

## 2021-01-05 MED FILL — ELECTROLYTE REPLACEMENT PROTOCOL: Qty: 1

## 2021-01-05 MED FILL — TYLENOL 325 MG TABLET: 325 mg | ORAL | Qty: 2

## 2021-01-05 MED FILL — DEXAMETHASONE SODIUM PHOSPHATE 4 MG/ML IJ SOLN: 4 mg/mL | INTRAMUSCULAR | Qty: 1

## 2021-01-05 MED FILL — SODIUM CHLORIDE 0.9 % IV: INTRAVENOUS | Qty: 1000

## 2021-01-05 MED FILL — DONEPEZIL 10 MG TAB: 10 mg | ORAL | Qty: 1

## 2021-01-05 MED FILL — CARDENE 40 MG/200 ML(0.2 MG/ML) IN SOD CHLOR(ISO-OSM) INTRAVENOUS SOLN: 40 mg/200 mL (0.2 mg/mL) | INTRAVENOUS | Qty: 200

## 2021-01-05 MED FILL — CITALOPRAM 20 MG TAB: 20 mg | ORAL | Qty: 1

## 2021-01-05 MED FILL — PANTOPRAZOLE 40 MG IV SOLR: 40 mg | INTRAVENOUS | Qty: 40

## 2021-01-05 MED FILL — PRAVASTATIN 40 MG TAB: 40 mg | ORAL | Qty: 2

## 2021-01-05 MED FILL — LEVOTHYROXINE 25 MCG TAB: 25 mcg | ORAL | Qty: 1

## 2021-01-05 MED FILL — POTASSIUM CHLORIDE SR 10 MEQ TAB: 10 mEq | ORAL | Qty: 2

## 2021-01-05 MED FILL — METOPROLOL TARTRATE 25 MG TAB: 25 mg | ORAL | Qty: 1

## 2021-01-05 NOTE — Progress Notes (Signed)
 Patient admitted on 01/04/2021 from home with   Chief Complaint   Patient presents with   . Fall   . Head Injury          The patient is being treated for    PMH:   Past Medical History:   Diagnosis Date   . Arthritis    . Dementia (HCC)    . High cholesterol    . Hypertension    . S/P hip replacement    . Thyroid disorder         Treatment Team: Treatment Team: Attending Provider: Sean Gaither SQUIBB, MD; Consulting Provider: Darral Alm BROCKS, MD; Consulting Provider: Other, Phys, MD; Consulting Provider: Tobie Pinna, MD; Consulting Provider: Brien Dicker, MD; Consulting Provider: Calleen Hadassah Dawes, NP; Primary Nurse: Zenobia Rhetta CROME., RN; Consulting Provider: Sean Gaither SQUIBB, MD      The patient has been admitted to the hospital 1 times in the past 12 months.      Patient and Family/Caregivers Goals of Care: for mother to get better    Caregivers Participating in Plan of Care/Discharge Plan with the patient: son, ex daughter in law    Tentative dc plan: pending hospital course, SNF vs HH. Need therapy evals. [facility] is across the street from SNF.     Anticipated DME needs for discharge: tbd    PRESCREENING COMPLETED FOR SNF yes  Will patient qualify for medicaid now or in the next 180 days? no    Does the patient have appropriate clothing available to be worn at discharge? Family can assist      The patient and care participants are willing to travel local area for discharge facility.  The patient and plan of care participants have been provided with a list of all available Rehab Facilities or Home Health agencies as applicable. CM will follow up with a list of facilities or agencies that are offer acceptance.    CM has disclosed any financial interest that [facility] may have with any facility or agency.    Anticipated Discharge Date: 3-4 days      Barriers to Healthcare Success/ Readmission Risk Factors: re-admit hx    Consults:  Palliative Care Consult Recommended: no  Transitional Care Clinic  Referral: no  Transitional Nurse Navigator Referral: no  Oncology Navigator Referral: no  SW consulted: yes  [facility] Consulted: no  Outside [employer] Referrals and Collaboration: no    Food/Nutrition Needs:   no                   Dietician Consulted: no    RRAT Score: Medium Risk            14 Total Score    4 IP Visits Last 12 Months (1-3=4, 4=9, >4=11)    5 Pt. Coverage (Medicare=5 , Medicaid, or Self-Pay=4)    5 Charlson Comorbidity Score (Age + Comorbid Conditions)        Criteria that do not apply:    Has Seen PCP in Last 6 Months (Yes=3, No=0)    Married. Living with Significant Other. Assisted Living. LTAC. SNF. or   Rehab    Patient Length of Stay (>5 days = 3)           PCP: None  Son unsure - states ex wife assists with scheduling and transporting patient    Pharmacy: [facility] on [address]    DME available at Home: toilet rails    Home Environment and  Prior Level of Function: Lives at [address] [place] [address] @HOMEPHONE @. Lives with son   How many stories is home 1   Steps into home 1  Responsibilities at home include needs assist with ADLs. Stays in bed most of day.     Prior to admission open services: PCA - private duty, MWF 3hrs/day      Extended Emergency Contact Information  Primary Emergency Contact: Cylee, Dattilo  Mobile Phone: [phone]  Relation: Child  Hearing or visual needs: NONE  Other needs: NONE  Preferred language: ENGLISH  Interpreter needed? No  Secondary Emergency Contact: Emberli, Ballester  Mobile Phone: [phone]  Relation: Grandchild  Preferred language: ENGLISH  Interpreter needed? No     Transportation: BLS will transport     Therapy Recommendations: not at this time     RT Home O2 Evaluation =no    Wound Care =no    Case Management Assessment    ABUSE/NEGLECT SCREENING   Physical Abuse/Neglect: Denies   Sexual Abuse: Denies   Sexual Abuse: Denies   Other Abuse/Issues: Denies          PRIMARY DECISION  MAKER    self, son                               CARE MANAGEMENT INTERVENTIONS   Readmission Interview Completed: No   PCP Verified by CM: No (son does not recall - DIL assists)       Palliative Care Criteria Met (RRAT>21 & CHF Dx)?: No   Mode of Transport at Discharge: BLS       Transition of Care Consult (CM Consult): Discharge Planning           MyChart Signup: No   Discharge Durable Medical Equipment: No   Physical Therapy Consult: No   Occupational Therapy Consult: No   Speech Therapy Consult: No       Reason for Referral: DCP Rounds   History Provided By: Child/Family   Patient Orientation: Alert and Oriented,Self   Cognition: Dementia / Early Alzheimer's   Support System Response: Concerned,Cooperative   Previous Living Arrangement: Lives with Family Dependent       Prior Functional Level: Assistance with the following:,Mobility,Shopping,Housework,Cooking,Dressing   Current Functional Level: Assistance with the following:,Bathing,Dressing,Housework,Cooking,Feeding,Toileting,Shopping,Mobility       Can patient return to prior living arrangement: Unknown at present   Ability to make needs known:: Poor   Family able to assist with home care needs:: Yes               Types of Needs Identified: Disease Management Education,Treatment Education,ADLs/IADLs   Anticipated Discharge Needs: Home Health [facility]                      DISCHARGE LOCATION

## 2021-01-05 NOTE — Consults (Signed)
Consultation      Impression:   Left shoulder non displaced distal clavicle fracture  Subdural hematoma  Plan:   1.  While in bed she does not need anything to support the arm.  When she starts to mobilize would recommend a sling for comfort.  Would also not recommend any weightbearing through that arm.  2.  We discussed with her the fracture of the distal clavicle.  Due to it being nondisplaced I would recommend nonoperative treatment for it.  This consists of a sling for pain control.  She voiced understanding of that.  Shoulder will probably be sore for a couple weeks and then we can begin range of motion.  3.  We will continue to follow her remotely while in the hospital.  Obviously her subdural hematoma is more pressing than this.        Rossanna Spitzley is a 86 y.o. female upon whom a consultation was requested by ER.    Specific Reason For Consultation: Left shoulder pain      HPI:  85 y.o. WHITE/NON-HISPANIC female who presents with for evaluation of head injury after a fall  Patient suffered large hematoma on the left side of the forehead, never lost consciousness  Patient is not on any anticoagulation, has been having falls this with the second fall today  When patient fell down, she did fall on to the left shoulder and is having pain within it.      Past Medical History:   Diagnosis Date   ??? Arthritis    ??? Dementia (HCC)    ??? High cholesterol    ??? Hypertension    ??? S/P hip replacement    ??? Thyroid disorder      History reviewed. No pertinent surgical history.  @SOCR @  Family History   Family history unknown: Yes     Allergies   Allergen Reactions   ??? Pcn [Penicillins] Hives       Home Medications:   See med rec    Review of Systems:     Constitutional:  No Fever, chills, sweats; No weight loss    Skin:  No rash, ulcers; No itching   HEENT:  No changes in vision or hearing. No difficulty swallowing   Cardiovascular:  No CP, pressure, palpitations.  No neck, jaw, or arm pain.  No nausea or diaphoresis.  No  SOB, DOE, orthopnea, or PND.   Respiratory:  Pt reports no cough, wheezing or SOB. No sputum production   Gastrointestinal:  No nausea, vomiting, constipation, or diarrhea.  No abdominal pain. No passage of blood, mucus, or melena.   Genitourinary:  No dysuria; No urinary frequency or urgency   Musculoskeletal:  No joint pain, redness, or swelling.  No back pain.     Endo:  No polyuria; No heat or cold intolerance   Heme:  No bruising; No bleeding.  No unexplained fevers   Neurological:  No headache  No focal motor or sensory symptoms.  No slurred speech or word finding difficulties.     Psychiatric:  No depression, anxiety, or insomnia     Physical Assessment:     Visit Vitals  BP 139/69   Pulse 64   Temp 96.8 ??F (36 ??C)   Resp 16   Wt 54.5 kg (120 lb 2.4 oz)   SpO2 98%   BMI 22.70 kg/m??     Physical Exam:  HEENT: NC, AT, PERRLA  Neck: Supple, FROM  Heart: RRR, no M,R,C, G  Lungs: CTA  ABD: Soft,NT/ND  Musculoskeletal:  Left upper extremity: Mild edema around the shoulder.  No ecchymosis no erythema.  Exquisitely tender to palpation over the distal clavicle and AC joint.  Mild tenderness over the subacromial bursa.  No specific pain with internal or external rotation of the arm.  Flexion extension of the elbow and rotation of the wrist do not produce any pain.  She is neurovascularly intact distally in the arm.  Fires her AIN PIN and ulnar nerves.  Palpable pulses.  Good capillary refill.  Denies any other tenderness elsewhere in the body.    X-rays of her elbow and shoulder show a nondisplaced distal clavicle fracture.  Glenohumeral joint is relatively well-maintained.  Elbow shows no evidence of acute injury.      Caryl Ada, MD  01/05/2021, 7:33 AM

## 2021-01-05 NOTE — Progress Notes (Signed)
Medicine Progress Note    Patient: Beverly Nguyen   Age:  86 y.o.  DOA: 01/04/2021     LOS:  LOS: 1 day     Assessment/Plan      Left subdural hematoma with 5.8 mm midline shift  Left frontal scalp hematoma  Left shoulder nondisplaced distal clavicle fracture  Hypertension  Hyperlipidemia  Hypothyroidism    -CTH this a.m. shows decreased shift  -Neurosurgery Dr. Byrd Hesselbach appreciated  -Continue Keppra and dexamethasone  -Discontinue IV fluid  -Orthopedics is seen and recommends nonoperative management for distal clavicle fracture  -Continue to monitor in ICU    Subjective:   Patient seen and examined. No complaints, No N/V, F/C, CP or SOB     Objective:     Visit Vitals  BP (!) 126/52   Pulse 63   Temp 97.9 ??F (36.6 ??C)   Resp 16   Wt 54.5 kg (120 lb 2.4 oz)   SpO2 99%   BMI 22.70 kg/m??       Physical Exam:  General appearance: alert, cooperative, no distress, appears stated age  Head: Left forehead hematoma lungs: clear to auscultation bilaterally  Heart: regular rate and rhythm, S1, S2 normal, no murmur, click, rub or gallop  Abdomen: soft, non-tender. Bowel sounds normal. No masses,  no organomegaly  Extremities: extremities normal, atraumatic, no cyanosis or edema  Skin: Skin color, texture, turgor normal. No rashes or lesions in exposed areas   Neurologic: Grossly normal        Medications Reviewed:  Current Facility-Administered Medications   Medication Dose Route Frequency   ??? levETIRAcetam (KEPPRA) 500 mg in saline (iso-osm) 100 mL IVPB (premix)  500 mg IntraVENous Q12H   ??? dexamethasone (DECADRON) 4 mg/mL injection 4 mg  4 mg IntraVENous Q6H   ??? [Held by provider] aspirin chewable tablet 81 mg  81 mg Oral DAILY   ??? citalopram (CELEXA) tablet 20 mg  20 mg Oral DAILY   ??? donepeziL (ARICEPT) tablet 10 mg  10 mg Oral QHS   ??? levothyroxine (SYNTHROID) tablet 25 mcg  25 mcg Oral ACB   ??? metoprolol tartrate (LOPRESSOR) tablet 25 mg  25 mg Oral DAILY   ??? pravastatin (PRAVACHOL) tablet 80 mg  80 mg Oral DAILY   ???  acetaminophen (TYLENOL) tablet 650 mg  650 mg Oral Q6H PRN    Or   ??? acetaminophen (TYLENOL) solution 650 mg  650 mg Oral Q6H PRN    Or   ??? acetaminophen (TYLENOL) suppository 650 mg  650 mg Rectal Q6H PRN   ??? ondansetron (ZOFRAN) injection 4 mg  4 mg IntraVENous Q8H PRN   ??? PHOSPHATE ELECTROLYTE REPLACEMENT PROTOCOL STANDARD DOSING  1 Each Other PRN   ??? MAGNESIUM ELECTROLYTE REPLACEMENT PROTOCOL STANDARD DOSING  1 Each Other PRN   ??? POTASSIUM ELECTROLYTE REPLACEMENT PROTOCOL STANDARD DOSING  1 Each Other PRN   ??? CALCIUM ELECTROLYTE REPLACEMENT PROTOCOL STANDARD DOSING  1 Each Other PRN   ??? pantoprazole (PROTONIX) 40 mg in 0.9% sodium chloride 10 mL injection  40 mg IntraVENous DAILY   ??? 0.9% sodium chloride infusion  75 mL/hr IntraVENous CONTINUOUS   ??? niCARdipine in Saline (CARDENE) 40 mg/200 mL (0.2 mg/mL) 200 ml premix infusion  0-15 mg/hr IntraVENous TITRATE         Intake and Output:  Current Shift:  02/21 0701 - 02/21 1900  In: 675 [I.V.:675]  Out: 700 [Urine:700]  Last three shifts:  02/19 1901 - 02/21 0700  In: 782.5 [I.V.:782.5]  Out: -     Lab/Data Reviewed:  All lab and imaging  results for the last 24 hours reviewed.    Kristie Cowman, MD  P# [phone]  January 05, 2021    [facility] medical dictation software was used for portions of this report.  Unintended voice transcription errors may have occurred.

## 2021-01-05 NOTE — Progress Notes (Signed)
 Patient has not voided. Bladder scan done which shows 538 cc urine. Patient refusing straight cath, states give me time to pee. Will continue to monitor.

## 2021-01-05 NOTE — Progress Notes (Signed)
Neurosurgery PRogress NOte;  Hygroma seems to be smaller on today's head ct scan.  Will order another for tomorrow

## 2021-01-06 ENCOUNTER — Inpatient Hospital Stay: Admit: 2021-01-06 | Payer: MEDICARE

## 2021-01-06 LAB — CBC W/O DIFF
HCT: 28.2 % — ABNORMAL LOW (ref 37.0–50.0)
HGB: 9.1 gm/dl — ABNORMAL LOW (ref 13.0–17.2)
MCH: 27.9 pg (ref 25.4–34.6)
MCHC: 32.3 gm/dl (ref 30.0–36.0)
MCV: 86.5 fL (ref 80.0–98.0)
MPV: 10 fL (ref 6.0–10.0)
PLATELET: 301 10*3/uL (ref 140–450)
RBC: 3.26 M/uL — ABNORMAL LOW (ref 3.60–5.20)
RDW-SD: 43.8 (ref 36.4–46.3)
WBC: 13.7 10*3/uL — ABNORMAL HIGH (ref 4.0–11.0)

## 2021-01-06 LAB — GLUCOSE, POC
Glucose (POC): 156 mg/dL — ABNORMAL HIGH (ref 65–105)
Glucose (POC): 189 mg/dL — ABNORMAL HIGH (ref 65–105)
Glucose (POC): 148 mg/dL — ABNORMAL HIGH (ref 65–105)

## 2021-01-06 LAB — METABOLIC PANEL, BASIC
Anion gap: 6 mmol/L (ref 5–15)
BUN: 22 mg/dl (ref 7–25)
CO2: 20 mEq/L — ABNORMAL LOW (ref 21–32)
Calcium: 8.5 mg/dl (ref 8.5–10.1)
Chloride: 110 mEq/L — ABNORMAL HIGH (ref 98–107)
Creatinine: 1.3 mg/dl (ref 0.6–1.3)
GFR est AA: 50
GFR est non-AA: 42
Glucose: 148 mg/dl — ABNORMAL HIGH (ref 74–106)
Potassium: 4 mEq/L (ref 3.5–5.1)
Sodium: 136 mEq/L (ref 136–145)

## 2021-01-06 LAB — POCT GLUCOSE
POC Glucose: 156 mg/dL — ABNORMAL HIGH (ref 65–105)
POC Glucose: 189 mg/dL — ABNORMAL HIGH (ref 65–105)
POC Glucose: 148 mg/dL — ABNORMAL HIGH (ref 65–105)

## 2021-01-06 LAB — CBC
Hematocrit: 28.2 % — ABNORMAL LOW (ref 37.0–50.0)
Hemoglobin: 9.1 gm/dl — ABNORMAL LOW (ref 13.0–17.2)
MCH: 27.9 pg (ref 25.4–34.6)
MCHC: 32.3 gm/dl (ref 30.0–36.0)
MCV: 86.5 fL (ref 80.0–98.0)
MPV: 10 fL (ref 6.0–10.0)
Platelets: 301 10*3/uL (ref 140–450)
RBC: 3.26 M/uL — ABNORMAL LOW (ref 3.60–5.20)
RDW-SD: 43.8 (ref 36.4–46.3)
WBC: 13.7 10*3/uL — ABNORMAL HIGH (ref 4.0–11.0)

## 2021-01-06 LAB — BASIC METABOLIC PANEL
Anion Gap: 6 mmol/L (ref 5–15)
BUN: 22 mg/dl (ref 7–25)
CO2: 20 mEq/L — ABNORMAL LOW (ref 21–32)
Calcium: 8.5 mg/dl (ref 8.5–10.1)
Chloride: 110 mEq/L — ABNORMAL HIGH (ref 98–107)
Creatinine: 1.3 mg/dl (ref 0.6–1.3)
GFR African American: 50
Glucose: 148 mg/dl — ABNORMAL HIGH (ref 74–106)
Potassium: 4 mEq/L (ref 3.5–5.1)
Sodium: 136 mEq/L (ref 136–145)
eGFR NON-AA: 42

## 2021-01-06 MED ORDER — SODIUM CHLORIDE 0.9 % IV
INTRAVENOUS | Status: DC
Start: 2021-01-06 — End: 2021-01-07
  Administered 2021-01-07: 12:00:00 via INTRAVENOUS

## 2021-01-06 MED ORDER — AMLODIPINE 5 MG TAB
5 mg | Freq: Every day | ORAL | Status: DC
Start: 2021-01-06 — End: 2021-01-13
  Administered 2021-01-06 – 2021-01-13 (×8): via ORAL

## 2021-01-06 MED ORDER — DEXAMETHASONE SODIUM PHOSPHATE 4 MG/ML IJ SOLN
4 mg/mL | Freq: Four times a day (QID) | INTRAMUSCULAR | Status: DC
Start: 2021-01-06 — End: 2021-01-07
  Administered 2021-01-06 – 2021-01-07 (×4): via INTRAVENOUS

## 2021-01-06 MED FILL — DEXAMETHASONE SODIUM PHOSPHATE 4 MG/ML IJ SOLN: 4 mg/mL | INTRAMUSCULAR | Qty: 1

## 2021-01-06 MED FILL — PANTOPRAZOLE 40 MG IV SOLR: 40 mg | INTRAVENOUS | Qty: 40

## 2021-01-06 MED FILL — CITALOPRAM 20 MG TAB: 20 mg | ORAL | Qty: 1

## 2021-01-06 MED FILL — PRAVASTATIN 40 MG TAB: 40 mg | ORAL | Qty: 2

## 2021-01-06 MED FILL — LEVOTHYROXINE 25 MCG TAB: 25 mcg | ORAL | Qty: 1

## 2021-01-06 MED FILL — TYLENOL 325 MG TABLET: 325 mg | ORAL | Qty: 2

## 2021-01-06 MED FILL — DONEPEZIL 5 MG TAB: 5 mg | ORAL | Qty: 2

## 2021-01-06 MED FILL — METOPROLOL TARTRATE 25 MG TAB: 25 mg | ORAL | Qty: 1

## 2021-01-06 MED FILL — LEVETIRACETAM IN SOD CHLORIDE (ISO-OSM) 500 MG/100 ML IV PIGGY BACK: 500 mg/100 mL | INTRAVENOUS | Qty: 100

## 2021-01-06 MED FILL — AMLODIPINE 5 MG TAB: 5 mg | ORAL | Qty: 1

## 2021-01-06 MED FILL — SODIUM CHLORIDE 0.9 % IV: INTRAVENOUS | Qty: 1000

## 2021-01-06 NOTE — Progress Notes (Signed)
Progress Notes by Zoe Lan, MD at 01/06/21 [phone]                Author: Zoe Lan, MD  Service: Pulmonary Disease  Author Type: Physician       Filed: 01/06/21 0930  Date of Service: 01/06/21 0920  Status: Signed          Editor: Zoe Lan, MD (Physician)                              [facility] PULMONARY AND CRITICAL CARE MEDICINE           Progress Note         Name:  Beverly Nguyen        DOB:  10-Jan-1935     MRN:  [id number]        Date:  01/06/2021      [x]  I have reviewed the flowsheet and previous days notes. Events, vitals, medications and notes from last 24 hours reviewed.         ASSESSMENT:     ??   -Recurrent fall with resultant left subdural hematoma with 5.8 mm midline shift on head CT 01/04/2021.  Repeat CT head 01/06/2021 with stable subdural hematoma with slight increase in right midline  shift of 5 mm.   -Left frontal scalp hematoma   -Nondisplaced left shoulder fracture through the distal clavicle on shoulder x-ray 01/04/2021   -AKI.  Creatinine 1.3 this morning.   -Hypertension, latest echocardiogram on 12/18/2020 with EF of 70% and RVSP of 28   -Hyperlipidemia   -Hypothyroidism   -Osteoporosis   -Dementia   ??     PLAN:     ??   -Currently on room air. May use supplemental oxygen as needed and titrate settings to keep SPO2 >90%   -DuoNebs as needed   -Neurochecks and pupillometry readings per ICU protocol   -Continue recommendations per neurosurgery.     -Keep blood systolic blood pressure less than 140. Use nicardipine drip as necessary.  On p.o. metoprolol.   -Continue NS at 75 ml/hour.    -Keep HOB at 30 degrees   -Continue dexamethasone per neurosurgery.   -Continue Keppra 500 mg IV for seizure prophylaxis   -Noted plans for possible middle meningeal artery embolization by Dr. 02/15/2021.   -Sling support for comfort when mobilizing per orthopedic surgery.  No surgical intervention at this time.   -Avoid nephrotoxins. Renally dose medications. Monitor urine output. Daily  weights.   -Electrolyte replacements per standard protocol.    -Glucomander per protocol   -BMP and CBC in the AM  ??   -SUP: protonix   -DVT prophylaxis: SCDs   Discussed with patient, RN.   CC time 32 minutes.                   Subjective:    Awake alert oriented to name and place.  No significant overnight events.   ROS:    Unable to obtain due to patient's condition.             Allergy:     Allergies        Allergen  Reactions         ?  Pcn [Penicillins]  Hives            Vital Signs:       Visit Vitals   BP  (!) 151/60  Pulse  62      Temp  98.4 ??F (36.9 ??C)      Resp  16      Wt  54.5 kg (120 lb 2.4 oz)      SpO2  99%      BMI  22.70 kg/m??                O2 Device: None (Room air)             Temp (24hrs), Avg:97.8 ??F (36.6 ??C), Min:97.4 ??F (36.3 ??C), Max:98.4 ??F (36.9 ??C)           Patient Vitals for the past 8 hrs:            Temp  Pulse  Resp  BP  SpO2            01/06/21 0819  --  62  --  (!) 151/60  --            01/06/21 0800  --  66  16  (!) 151/60  99 %     01/06/21 0730  --  71  11  (!) 129/104  99 %     01/06/21 0700  --  63  16  (!) 154/54  99 %     01/06/21 0600  --  68  16  129/65  99 %     01/06/21 0500  --  73  14  (!) 143/115  99 %     01/06/21 0400  98.4 ??F (36.9 ??C)  62  18  (!) 131/58  98 %     01/06/21 0300  98.1 ??F (36.7 ??C)  64  17  (!) 135/50  96 %            01/06/21 0200  --  63  17  (!) 135/53  95 %           Intake/Output:    Last shift:      02/22 0701 - 02/22 1900   In: 240 [P.O.:240]   Out: -    Last 3 shifts: 02/20 1901 - 02/22 0700   In: 1724.2 [I.V.:1724.2]   Out: 1025 [Urine:1025]      Intake/Output Summary (Last 24 hours) at 01/06/2021 0920   Last data filed at 01/06/2021 0847     Gross per 24 hour        Intake  956.67 ml        Output  1025 ml        Net  -68.33 ml           Ventilator Settings:          Mode  Rate  Tidal Volume  Pressure  FiO2  PEEP                                         Physical Exam:      GENERAL: AAO x name and place.  Left forehead hematoma.    NECK: Supple. Trachea midline.    RESPIRATORY: Bilateral BS present, decreased at bases. No rales or rhonchi.    CARDIOVASCULAR: S1 and S2 present, regular. No murmur, rub, or thrill.    ABDOMEN: Soft and nontender with positive bowel sounds. No organomegaly.    NEUROLOGICAL: No focal deficits   EXT: No edema or cyanosis.  DATA:      Current Facility-Administered Medications          Medication  Dose  Route  Frequency           ?  dexamethasone (DECADRON) 4 mg/mL injection 2 mg   2 mg  IntraVENous  Q6H     ?  levETIRAcetam (KEPPRA) 500 mg in saline (iso-osm) 100 mL IVPB (premix)   500 mg  IntraVENous  Q12H     ?  [Held by provider] aspirin chewable tablet 81 mg   81 mg  Oral  DAILY     ?  citalopram (CELEXA) tablet 20 mg   20 mg  Oral  DAILY     ?  donepeziL (ARICEPT) tablet 10 mg   10 mg  Oral  QHS     ?  levothyroxine (SYNTHROID) tablet 25 mcg   25 mcg  Oral  ACB     ?  metoprolol tartrate (LOPRESSOR) tablet 25 mg   25 mg  Oral  DAILY     ?  pravastatin (PRAVACHOL) tablet 80 mg   80 mg  Oral  DAILY           ?  acetaminophen (TYLENOL) tablet 650 mg   650 mg  Oral  Q6H PRN          Or           ?  acetaminophen (TYLENOL) solution 650 mg   650 mg  Oral  Q6H PRN          Or           ?  acetaminophen (TYLENOL) suppository 650 mg   650 mg  Rectal  Q6H PRN     ?  ondansetron (ZOFRAN) injection 4 mg   4 mg  IntraVENous  Q8H PRN     ?  PHOSPHATE ELECTROLYTE REPLACEMENT PROTOCOL STANDARD DOSING   1 Each  Other  PRN     ?  MAGNESIUM ELECTROLYTE REPLACEMENT PROTOCOL STANDARD DOSING   1 Each  Other  PRN     ?  POTASSIUM ELECTROLYTE REPLACEMENT PROTOCOL STANDARD DOSING   1 Each  Other  PRN     ?  CALCIUM ELECTROLYTE REPLACEMENT PROTOCOL STANDARD DOSING   1 Each  Other  PRN     ?  pantoprazole (PROTONIX) 40 mg in 0.9% sodium chloride 10 mL injection   40 mg  IntraVENous  DAILY           ?  niCARdipine in Saline (CARDENE) 40 mg/200 mL (0.2 mg/mL) 200 ml premix infusion   0-15 mg/hr  IntraVENous  TITRATE                     Labs:   Reviewed.      Recent Results (from the past 24 hour(s))     POTASSIUM          Collection Time: 01/05/21 10:27 AM         Result  Value  Ref Range            Potassium  4.1  3.5 - 5.1 mEq/L       URINALYSIS W/ RFLX MICROSCOPIC          Collection Time: 01/05/21 12:17 PM         Result  Value  Ref Range            Color  YELLOW  YELLOW,STRAW  Appearance  CLEAR  CLEAR         Glucose  NEGATIVE  NEGATIVE,Negative mg/dl       Bilirubin  NEGATIVE  NEGATIVE,Negative         Ketone  TRACE (A)  NEGATIVE,Negative mg/dl       Specific gravity  1.015  1.005 - 1.030         Blood  NEGATIVE  NEGATIVE,Negative         pH (UA)  6.0  5.0 - 9.0         Protein  NEGATIVE  NEGATIVE,Negative mg/dl       Urobilinogen  0.2  0.0 - 1.0 mg/dl       Nitrites  NEGATIVE  NEGATIVE,Negative         Leukocyte Esterase  NEGATIVE  NEGATIVE,Negative         GLUCOSE, POC          Collection Time: 01/05/21  5:20 PM         Result  Value  Ref Range            Glucose (POC)  268 (H)  65 - 105 mg/dL       CBC W/O DIFF          Collection Time: 01/06/21  1:00 AM         Result  Value  Ref Range            WBC  13.7 (H)  4.0 - 11.0 1000/mm3       RBC  3.26 (L)  3.60 - 5.20 M/uL       HGB  9.1 (L)  13.0 - 17.2 gm/dl       HCT  16.128.2 (L)  09.637.0 - 50.0 %       MCV  86.5  80.0 - 98.0 fL       MCH  27.9  25.4 - 34.6 pg       MCHC  32.3  30.0 - 36.0 gm/dl       PLATELET  045301  409140 - 450 1000/mm3       MPV  10.0  6.0 - 10.0 fL       RDW-SD  43.8  36.4 - 46.3         METABOLIC PANEL, BASIC          Collection Time: 01/06/21  1:00 AM         Result  Value  Ref Range            Sodium  136  136 - 145 mEq/L       Potassium  4.0  3.5 - 5.1 mEq/L       Chloride  110 (H)  98 - 107 mEq/L       CO2  20 (L)  21 - 32 mEq/L       Glucose  148 (H)  74 - 106 mg/dl       BUN  22  7 - 25 mg/dl       Creatinine  1.3  0.6 - 1.3 mg/dl       GFR est AA  81.150.0          GFR est non-AA  42          Calcium  8.5  8.5 - 10.1 mg/dl       Anion gap  6  5 - 15 mmol/L        GLUCOSE, POC  Collection Time: 01/06/21  5:52 AM         Result  Value  Ref Range            Glucose (POC)  156 (H)  65 - 105 mg/dL             Imaging:   Personally reviewed.                Zoe Lan, MD         [employer] medical dictation software was used for portions of this report. Unintended voice transcription errors may have occurred.

## 2021-01-06 NOTE — Progress Notes (Signed)
Code R Consult Received.    After Hours Consult:      Yes    Last admission dc date and dx        12/19/2020 subdural hematoma    What was the discharge plan     Home with hh    Now presents        Subdural hematoma    Case Discussed with ED Physician:   N    Case Discussed with Attending Physician:    N    Appropriate for Admission:      Yes inpt    Anticipated Discharge Needs:      tbd    Disposition if discharged from the ED      icu 413    Readmission interview done (epic)        Readmission assessment completed (compare AD)   y    Vicenta Aly complete y

## 2021-01-06 NOTE — Consults (Signed)
Neurovascular    Consultation Note        Patient: Beverly Nguyen MRN: [id number]  SSN: [id number]    Date of Birth: 1934/12/31  Age: 86 y.o.  Sex: female        ID: 86 year-old female with dementia and falls found to have  CC: enlarging left frontal chronic subdural hematoma.    HPI:  Dr. Su Ley contacted the neurovascular service about this patient in the [facility] ICU. Briefly she has experienced recurrent falls.     Neuroimaging personally review. Initially small left larger than right subdural fluid collections.  Most recent studies have shown the right subdural hematoma nearly resolved while the left is thicker about 61mm.  There is some sulci effacement but no cortical edema noted and about 5mm of left to right midline shift.  Small left frontal scalp hematoma.    Past Medical History:   Diagnosis Date   ??? Arthritis    ??? Dementia (HCC)    ??? High cholesterol    ??? Hypertension    ??? S/P hip replacement    ??? Thyroid disorder         History reviewed. No pertinent surgical history.    Social History     Socioeconomic History   ??? Marital status: UNKNOWN     Spouse name: Not on file   ??? Number of children: Not on file   ??? Years of education: Not on file   ??? Highest education level: Not on file   Occupational History   ??? Not on file   Tobacco Use   ??? Smoking status: Former Smoker   ??? Smokeless tobacco: Never Used   ??? Tobacco comment: dementia   Substance and Sexual Activity   ??? Alcohol use: Never   ??? Drug use: Never   ??? Sexual activity: Not on file   Other Topics Concern   ??? Not on file   Social History Narrative   ??? Not on file     Social Determinants of Health     Financial Resource Strain:    ??? Difficulty of Paying Living Expenses: Not on file   Food Insecurity:    ??? Worried About Running Out of Food in the Last Year: Not on file   ??? Ran Out of Food in the Last Year: Not on file   Transportation Needs:    ??? Lack of Transportation (Medical): Not on file   ??? Lack of Transportation (Non-Medical): Not on file   Physical  Activity:    ??? Days of Exercise per Week: Not on file   ??? Minutes of Exercise per Session: Not on file   Stress:    ??? Feeling of Stress : Not on file   Social Connections:    ??? Frequency of Communication with Friends and Family: Not on file   ??? Frequency of Social Gatherings with Friends and Family: Not on file   ??? Attends Religious Services: Not on file   ??? Active Member of Clubs or Organizations: Not on file   ??? Attends Banker Meetings: Not on file   ??? Marital Status: Not on file   Intimate Partner Violence:    ??? Fear of Current or Ex-Partner: Not on file   ??? Emotionally Abused: Not on file   ??? Physically Abused: Not on file   ??? Sexually Abused: Not on file   Housing Stability:    ??? Unable to Pay for Housing in the Last Year: Not on  file   ??? Number of Places Lived in the Last Year: Not on file   ??? Unstable Housing in the Last Year: Not on file        Family History   Family history unknown: Yes        Allergies   Allergen Reactions   ??? Pcn [Penicillins] Hives        Current Facility-Administered Medications   Medication Dose Route Frequency   ??? dexamethasone (DECADRON) 4 mg/mL injection 2 mg  2 mg IntraVENous Q6H   ??? amLODIPine (NORVASC) tablet 5 mg  5 mg Oral DAILY   ??? levETIRAcetam (KEPPRA) 500 mg in saline (iso-osm) 100 mL IVPB (premix)  500 mg IntraVENous Q12H   ??? [Held by provider] aspirin chewable tablet 81 mg  81 mg Oral DAILY   ??? citalopram (CELEXA) tablet 20 mg  20 mg Oral DAILY   ??? donepeziL (ARICEPT) tablet 10 mg  10 mg Oral QHS   ??? levothyroxine (SYNTHROID) tablet 25 mcg  25 mcg Oral ACB   ??? metoprolol tartrate (LOPRESSOR) tablet 25 mg  25 mg Oral DAILY   ??? pravastatin (PRAVACHOL) tablet 80 mg  80 mg Oral DAILY   ??? acetaminophen (TYLENOL) tablet 650 mg  650 mg Oral Q6H PRN    Or   ??? acetaminophen (TYLENOL) solution 650 mg  650 mg Oral Q6H PRN    Or   ??? acetaminophen (TYLENOL) suppository 650 mg  650 mg Rectal Q6H PRN   ??? ondansetron (ZOFRAN) injection 4 mg  4 mg IntraVENous Q8H PRN   ???  PHOSPHATE ELECTROLYTE REPLACEMENT PROTOCOL STANDARD DOSING  1 Each Other PRN   ??? MAGNESIUM ELECTROLYTE REPLACEMENT PROTOCOL STANDARD DOSING  1 Each Other PRN   ??? POTASSIUM ELECTROLYTE REPLACEMENT PROTOCOL STANDARD DOSING  1 Each Other PRN   ??? CALCIUM ELECTROLYTE REPLACEMENT PROTOCOL STANDARD DOSING  1 Each Other PRN   ??? pantoprazole (PROTONIX) 40 mg in 0.9% sodium chloride 10 mL injection  40 mg IntraVENous DAILY   ??? niCARdipine in Saline (CARDENE) 40 mg/200 mL (0.2 mg/mL) 200 ml premix infusion  0-15 mg/hr IntraVENous TITRATE        Review of Systems:   She was unable to answer reliably.        Data:  Recent Results (from the past 24 hour(s))   GLUCOSE, POC    Collection Time: 01/05/21  5:20 PM   Result Value Ref Range    Glucose (POC) 268 (H) 65 - 105 mg/dL   CBC W/O DIFF    Collection Time: 01/06/21  1:00 AM   Result Value Ref Range    WBC 13.7 (H) 4.0 - 11.0 1000/mm3    RBC 3.26 (L) 3.60 - 5.20 M/uL    HGB 9.1 (L) 13.0 - 17.2 gm/dl    HCT 38.1 (L) 01.7 - 50.0 %    MCV 86.5 80.0 - 98.0 fL    MCH 27.9 25.4 - 34.6 pg    MCHC 32.3 30.0 - 36.0 gm/dl    PLATELET 510 258 - 527 1000/mm3    MPV 10.0 6.0 - 10.0 fL    RDW-SD 43.8 36.4 - 46.3     METABOLIC PANEL, BASIC    Collection Time: 01/06/21  1:00 AM   Result Value Ref Range    Sodium 136 136 - 145 mEq/L    Potassium 4.0 3.5 - 5.1 mEq/L    Chloride 110 (H) 98 - 107 mEq/L    CO2 20 (L) 21 -  32 mEq/L    Glucose 148 (H) 74 - 106 mg/dl    BUN 22 7 - 25 mg/dl    Creatinine 1.3 0.6 - 1.3 mg/dl    GFR est AA 13.050.0      GFR est non-AA 42      Calcium 8.5 8.5 - 10.1 mg/dl    Anion gap 6 5 - 15 mmol/L   GLUCOSE, POC    Collection Time: 01/06/21  5:52 AM   Result Value Ref Range    Glucose (POC) 156 (H) 65 - 105 mg/dL   GLUCOSE, POC    Collection Time: 01/06/21 11:23 AM   Result Value Ref Range    Glucose (POC) 189 (H) 65 - 105 mg/dL          Vital Signs:  Patient Vitals for the past 24 hrs:   Temp Pulse Resp BP SpO2   01/06/21 1524 98.3 ??F (36.8 ??C) ??? ??? ??? ???   01/06/21 1500 ??? 63  18 (!) 140/63 ???   01/06/21 1400 ??? (!) 55 17 (!) 150/49 ???   01/06/21 1300 ??? 76 22 129/68 ???   01/06/21 1230 ??? 62 18 (!) 147/52 ???   01/06/21 1200 ??? 62 14 (!) 131/52 ???   01/06/21 1130 ??? 60 14 131/62 ???   01/06/21 1109 97.4 ??F (36.3 ??C) ??? ??? ??? ???   01/06/21 1100 ??? (!) 58 16 (!) 151/49 ???   01/06/21 1030 ??? (!) 53 15 (!) 148/57 ???   01/06/21 1000 ??? (!) 58 15 (!) 153/51 ???   01/06/21 0930 ??? 70 17 (!) 126/56 ???   01/06/21 0900 ??? 67 17 (!) 136/58 ???   01/06/21 0819 ??? 62 ??? (!) 151/60 ???   01/06/21 0800 ??? 66 16 (!) 151/60 99 %   01/06/21 0730 ??? 71 11 (!) 129/104 99 %   01/06/21 0700 ??? 63 16 (!) 154/54 99 %   01/06/21 0600 ??? 68 16 129/65 99 %   01/06/21 0500 ??? 73 14 (!) 143/115 99 %   01/06/21 0400 98.4 ??F (36.9 ??C) 62 18 (!) 131/58 98 %   01/06/21 0300 98.1 ??F (36.7 ??C) 64 17 (!) 135/50 96 %   01/06/21 0200 ??? 63 17 (!) 135/53 95 %   01/06/21 0110 ??? ??? ??? (!) 140/67 ???   01/06/21 0100 ??? 74 18 (!) 148/73 99 %   01/06/21 0030 ??? ??? ??? (!) 155/66 ???   01/06/21 0000 97.4 ??F (36.3 ??C) (!) 59 17 (!) 155/66 98 %   01/05/21 2344 97.4 ??F (36.3 ??C) ??? ??? ??? ???   01/05/21 2300 ??? (!) 56 17 ??? 98 %   01/05/21 2200 ??? 63 18 (!) 149/61 98 %   01/05/21 2100 ??? (!) 59 17 (!) 153/57 97 %   01/05/21 2000 97.4 ??F (36.3 ??C) (!) 57 17 (!) 144/56 98 %   01/05/21 1934 97.4 ??F (36.3 ??C) ??? ??? ??? ???   01/05/21 1900 ??? (!) 58 17 (!) 142/56 98 %   01/05/21 1830 ??? 74 14 134/66 100 %   01/05/21 1800 ??? 66 16 (!) 140/68 99 %   01/05/21 1730 ??? 68 20 (!) 139/55 99 %   01/05/21 1700 ??? 63 19 (!) 134/49 98 %         Physical Exam:  Sitting in bed. Alert, oriented to in hospital but not name of.  Knows she had falls and bump on the head but  does not understand the intracranial portion of her illness.  Names routine objects quickly.  No dysarthria.  No neglect.  Right elbow extension 4-/5 others 4/5.  Right pronator drift.   Localized LT symmetrically.  Right FF slower than left. Left scalp hematoma.        Assessment/Plan:  86 year-old female with enlarging left frontal chronic subdural  hematoma.    She is doing fair with some very mild right arm weakness compared to the left in addition to her cognitive difficulties which are likely chronic but could have a superposed subacute component.      As discussed with Dr. Su Ley she does not need urgent decompression.  Left middle meningeal artery embolization may decrease her production of fluid into the subdural space allowing her to resorb the cSDH.  As the left cSDH decreases there could be a recrudescence of the right cSDH.      The benefits, risks, and alternatives to cerebral angiography and left middle meningeal artery embolization discussed on the telephone with her son, Althia Egolf.  All immediate questions were answered.  He consented to proceed on behalf of his mother who is not capable.  She assented to proceed.    While she could leave the ICU this evening she will need an ICU bed around noon tomorrow post procedure. She is currently on levetiracetam.  Gradually wean unless she has transient neurologic event concerning for seizure and/or EEG is concerning.  Wean steroids. Hold antithrombotic agents for now. Coags and platelets acceptable.  SBP normalization 100-130.  NPO past MN except meds.  Normotonic hydration starting tomorrow.      40 minutes of neurologic care provided independent of procedures; more than 50% counseling and coordinating care.    Andrey Cota, MD    Addendum  Additional discussion with the patient's former daughter-in-law Lennox Leikam who states she is the medical healthcare proxy.  The benefits, risks, and alternatives to cerebral angiography and left middle meningeal artery embolization werediscussed on the telephone with her former daughter-in-law.  All immediate questions were answered.  She consented to proceed on behalf of the patient who is not capable.    Andrey Cota, MD

## 2021-01-06 NOTE — Progress Notes (Signed)
Progress  Notes by Bethann Goo, MD at 01/06/21 [phone]                Author: Bethann Goo, MD  Service: Neurosurgery  Author Type: Physician       Filed: 01/06/21 0811  Date of Service: 01/06/21 0808  Status: Signed          Editor: Bethann Goo, MD (Physician)                        NEUROSURGERY PROGRESS NOTE       SURGERY DATE: 01/04/2021               POD#    Dx:  Subdural hematoma (HCC) [S06.5X9A]   Fall [W19.XXXA]   Dementia (HCC) [F03.90]          SUBJECTIVE:   CT today hygroma perhaps a touch larger      EXAM:       Vitals:             01/06/21 0400  01/06/21 0500  01/06/21 0600  01/06/21 0700           BP:  (!) 131/58  (!) 143/115  129/65  (!) 154/54     Pulse:  62  73  68  63     Resp:  18  14  16  16      Temp:  98.4 ??F (36.9 ??C)           SpO2:  98%  99%  99%  99%           Weight:                   Fairly alert and appropriate. Slightly confused at baseline Motor and sensory function are stable.        LABS:      Recent Results (from the past 24 hour(s))     POTASSIUM          Collection Time: 01/05/21 10:27 AM         Result  Value  Ref Range            Potassium  4.1  3.5 - 5.1 mEq/L       URINALYSIS W/ RFLX MICROSCOPIC          Collection Time: 01/05/21 12:17 PM         Result  Value  Ref Range            Color  YELLOW  YELLOW,STRAW         Appearance  CLEAR  CLEAR         Glucose  NEGATIVE  NEGATIVE,Negative mg/dl       Bilirubin  NEGATIVE  NEGATIVE,Negative         Ketone  TRACE (A)  NEGATIVE,Negative mg/dl       Specific gravity  1.015  1.005 - 1.030         Blood  NEGATIVE  NEGATIVE,Negative         pH (UA)  6.0  5.0 - 9.0         Protein  NEGATIVE  NEGATIVE,Negative mg/dl       Urobilinogen  0.2  0.0 - 1.0 mg/dl       Nitrites  NEGATIVE  NEGATIVE,Negative         Leukocyte Esterase  NEGATIVE  NEGATIVE,Negative         GLUCOSE, POC  Collection Time: 01/05/21  5:20 PM         Result  Value  Ref Range            Glucose (POC)  268 (H)  65 - 105 mg/dL       CBC W/O DIFF           Collection Time: 01/06/21  1:00 AM         Result  Value  Ref Range            WBC  13.7 (H)  4.0 - 11.0 1000/mm3       RBC  3.26 (L)  3.60 - 5.20 M/uL       HGB  9.1 (L)  13.0 - 17.2 gm/dl       HCT  16.1 (L)  09.6 - 50.0 %       MCV  86.5  80.0 - 98.0 fL       MCH  27.9  25.4 - 34.6 pg       MCHC  32.3  30.0 - 36.0 gm/dl       PLATELET  045  409 - 450 1000/mm3       MPV  10.0  6.0 - 10.0 fL       RDW-SD  43.8  36.4 - 46.3         METABOLIC PANEL, BASIC          Collection Time: 01/06/21  1:00 AM         Result  Value  Ref Range            Sodium  136  136 - 145 mEq/L       Potassium  4.0  3.5 - 5.1 mEq/L       Chloride  110 (H)  98 - 107 mEq/L       CO2  20 (L)  21 - 32 mEq/L       Glucose  148 (H)  74 - 106 mg/dl       BUN  22  7 - 25 mg/dl       Creatinine  1.3  0.6 - 1.3 mg/dl       GFR est AA  81.1          GFR est non-AA  42               Calcium  8.5  8.5 - 10.1 mg/dl            Anion gap  6  5 - 15 mmol/L       GLUCOSE, POC          Collection Time: 01/06/21  5:52 AM         Result  Value  Ref Range            Glucose (POC)  156 (H)  65 - 105 mg/dL                  IMPRESSION: L frontal hygroma      PLAN:     Discussed with Dr. Excell Seltzer about possible MMA embo   Neuro checks Q4 h   Decrease decadron      Bethann Goo, MD   January 06, 2021   8:09 AM

## 2021-01-06 NOTE — Progress Notes (Signed)
Medicine Progress Note    Patient: Beverly Nguyen   Age:  86 y.o.  DOA: 01/04/2021     LOS:  LOS: 2 days     Assessment/Plan      Left subdural hematoma with 5.8 mm midline shift  Left frontal scalp hematoma  Left shoulder nondisplaced distal clavicle fracture  Hypertension  Hyperlipidemia  Hypothyroidism    -CTH this a.m. shows worsening shift  -Neurosurgery Dr. Noel Gerold to discuss with Dr. Excell Seltzer possible MMA embolization  -Continue Keppra and decrease dexamethasone  -Orthopedics is seen and recommends nonoperative management for distal clavicle fracture  -When cleared by neurosurgery PT OT eval,  discussed with daughter would like patient to go to SNF on discharge  -Continue to monitor in ICU    Discussed with daughter Nolia Tschantz [phone]    Subjective:   Patient seen and examined. No complaints, No N/V, F/C, CP or SOB     Objective:     Visit Vitals  BP (!) 147/52   Pulse 62   Temp 97.4 ??F (36.3 ??C)   Resp 18   Wt 54.5 kg (120 lb 2.4 oz)   SpO2 99%   BMI 22.70 kg/m??       Physical Exam:  General appearance: alert, cooperative, no distress, appears stated age  Head: Left forehead hematoma lungs: clear to auscultation bilaterally  Heart: regular rate and rhythm, S1, S2 normal, no murmur, click, rub or gallop  Abdomen: soft, non-tender. Bowel sounds normal. No masses,  no organomegaly  Extremities: extremities normal, atraumatic, no cyanosis or edema  Skin: Skin color, texture, turgor normal. No rashes or lesions in exposed areas   Neurologic: Grossly normal        Medications Reviewed:  Current Facility-Administered Medications   Medication Dose Route Frequency   ??? dexamethasone (DECADRON) 4 mg/mL injection 2 mg  2 mg IntraVENous Q6H   ??? amLODIPine (NORVASC) tablet 5 mg  5 mg Oral DAILY   ??? levETIRAcetam (KEPPRA) 500 mg in saline (iso-osm) 100 mL IVPB (premix)  500 mg IntraVENous Q12H   ??? [Held by provider] aspirin chewable tablet 81 mg  81 mg Oral DAILY   ??? citalopram (CELEXA) tablet 20 mg  20 mg Oral DAILY   ???  donepeziL (ARICEPT) tablet 10 mg  10 mg Oral QHS   ??? levothyroxine (SYNTHROID) tablet 25 mcg  25 mcg Oral ACB   ??? metoprolol tartrate (LOPRESSOR) tablet 25 mg  25 mg Oral DAILY   ??? pravastatin (PRAVACHOL) tablet 80 mg  80 mg Oral DAILY   ??? acetaminophen (TYLENOL) tablet 650 mg  650 mg Oral Q6H PRN    Or   ??? acetaminophen (TYLENOL) solution 650 mg  650 mg Oral Q6H PRN    Or   ??? acetaminophen (TYLENOL) suppository 650 mg  650 mg Rectal Q6H PRN   ??? ondansetron (ZOFRAN) injection 4 mg  4 mg IntraVENous Q8H PRN   ??? PHOSPHATE ELECTROLYTE REPLACEMENT PROTOCOL STANDARD DOSING  1 Each Other PRN   ??? MAGNESIUM ELECTROLYTE REPLACEMENT PROTOCOL STANDARD DOSING  1 Each Other PRN   ??? POTASSIUM ELECTROLYTE REPLACEMENT PROTOCOL STANDARD DOSING  1 Each Other PRN   ??? CALCIUM ELECTROLYTE REPLACEMENT PROTOCOL STANDARD DOSING  1 Each Other PRN   ??? pantoprazole (PROTONIX) 40 mg in 0.9% sodium chloride 10 mL injection  40 mg IntraVENous DAILY   ??? niCARdipine in Saline (CARDENE) 40 mg/200 mL (0.2 mg/mL) 200 ml premix infusion  0-15 mg/hr IntraVENous TITRATE  Intake and Output:  Current Shift:  02/22 0701 - 02/22 1900  In: 240 [P.O.:240]  Out: -   Last three shifts:  02/20 1901 - 02/22 0700  In: 1724.2 [I.V.:1724.2]  Out: 1025 [Urine:1025]    Lab/Data Reviewed:  All lab and imaging  results for the last 24 hours reviewed.    Kristie Cowman, MD  P# [phone]  January 06, 2021    [facility] medical dictation software was used for portions of this report.  Unintended voice transcription errors may have occurred.

## 2021-01-07 LAB — METABOLIC PANEL, BASIC
Anion gap: 4 mmol/L — ABNORMAL LOW (ref 5–15)
BUN: 28 mg/dl — ABNORMAL HIGH (ref 7–25)
CO2: 23 mEq/L (ref 21–32)
Calcium: 8.5 mg/dl (ref 8.5–10.1)
Chloride: 110 mEq/L — ABNORMAL HIGH (ref 98–107)
Creatinine: 1.4 mg/dl — ABNORMAL HIGH (ref 0.6–1.3)
GFR est AA: 46
GFR est non-AA: 38
Glucose: 136 mg/dl — ABNORMAL HIGH (ref 74–106)
Potassium: 4.2 mEq/L (ref 3.5–5.1)
Sodium: 137 mEq/L (ref 136–145)

## 2021-01-07 LAB — CBC W/O DIFF
HCT: 29.6 % — ABNORMAL LOW (ref 37.0–50.0)
HGB: 9.5 gm/dl — ABNORMAL LOW (ref 13.0–17.2)
MCH: 28.1 pg (ref 25.4–34.6)
MCHC: 32.1 gm/dl (ref 30.0–36.0)
MCV: 87.6 fL (ref 80.0–98.0)
MPV: 9.7 fL (ref 6.0–10.0)
PLATELET: 303 10*3/uL (ref 140–450)
RBC: 3.38 M/uL — ABNORMAL LOW (ref 3.60–5.20)
RDW-SD: 47 — ABNORMAL HIGH (ref 36.4–46.3)
WBC: 14.1 10*3/uL — ABNORMAL HIGH (ref 4.0–11.0)

## 2021-01-07 LAB — BLOOD TYPE, (ABO+RH)
ABO/Rh(D): O POS
ABO/Rh: O POS

## 2021-01-07 LAB — GLUCOSE, POC
Glucose (POC): 153 mg/dL — ABNORMAL HIGH (ref 65–105)
Glucose (POC): 155 mg/dL — ABNORMAL HIGH (ref 65–105)

## 2021-01-07 LAB — ANTIBODY SCREEN
Antibody Screen: NEGATIVE
Antibody screen: NEGATIVE

## 2021-01-07 LAB — BASIC METABOLIC PANEL
Anion Gap: 4 mmol/L — ABNORMAL LOW (ref 5–15)
BUN: 28 mg/dl — ABNORMAL HIGH (ref 7–25)
CO2: 23 mEq/L (ref 21–32)
Calcium: 8.5 mg/dl (ref 8.5–10.1)
Chloride: 110 mEq/L — ABNORMAL HIGH (ref 98–107)
Creatinine: 1.4 mg/dl — ABNORMAL HIGH (ref 0.6–1.3)
EGFR IF NonAfrican American: 38
GFR African American: 46
Glucose: 136 mg/dl — ABNORMAL HIGH (ref 74–106)
Potassium: 4.2 mEq/L (ref 3.5–5.1)
Sodium: 137 mEq/L (ref 136–145)

## 2021-01-07 LAB — CBC
Hematocrit: 29.6 % — ABNORMAL LOW (ref 37.0–50.0)
Hemoglobin: 9.5 gm/dl — ABNORMAL LOW (ref 13.0–17.2)
MCH: 28.1 pg (ref 25.4–34.6)
MCHC: 32.1 gm/dl (ref 30.0–36.0)
MCV: 87.6 fL (ref 80.0–98.0)
MPV: 9.7 fL (ref 6.0–10.0)
Platelets: 303 10*3/uL (ref 140–450)
RBC: 3.38 M/uL — ABNORMAL LOW (ref 3.60–5.20)
RDW-SD: 47 — ABNORMAL HIGH (ref 36.4–46.3)
WBC: 14.1 10*3/uL — ABNORMAL HIGH (ref 4.0–11.0)

## 2021-01-07 LAB — POCT GLUCOSE
POC Glucose: 153 mg/dL — ABNORMAL HIGH (ref 65–105)
POC Glucose: 155 mg/dL — ABNORMAL HIGH (ref 65–105)

## 2021-01-07 MED ORDER — SODIUM CHLORIDE 0.9 % IV
INTRAVENOUS | Status: DC | PRN
Start: 2021-01-07 — End: 2021-01-07
  Administered 2021-01-07: 15:00:00 via INTRAVENOUS

## 2021-01-07 MED ORDER — SUGAMMADEX 100 MG/ML INTRAVENOUS SOLUTION
100 mg/mL | INTRAVENOUS | Status: DC | PRN
Start: 2021-01-07 — End: 2021-01-07
  Administered 2021-01-07: 17:00:00 via INTRAVENOUS

## 2021-01-07 MED ORDER — FENTANYL CITRATE (PF) 50 MCG/ML IJ SOLN
50 mcg/mL | INTRAMUSCULAR | Status: AC
Start: 2021-01-07 — End: ?

## 2021-01-07 MED ORDER — FENTANYL CITRATE (PF) 50 MCG/ML IJ SOLN
50 mcg/mL | INTRAMUSCULAR | Status: DC | PRN
Start: 2021-01-07 — End: 2021-01-07
  Administered 2021-01-07: 16:00:00 via INTRAVENOUS

## 2021-01-07 MED ORDER — LIDOCAINE (PF) 20 MG/ML (2 %) IJ SOLN
20 mg/mL (2 %) | INTRAMUSCULAR | Status: DC | PRN
Start: 2021-01-07 — End: 2021-01-07
  Administered 2021-01-07 (×2): via INTRAVENOUS

## 2021-01-07 MED ORDER — ROCURONIUM 10 MG/ML IV
10 mg/mL | INTRAVENOUS | Status: DC | PRN
Start: 2021-01-07 — End: 2021-01-07
  Administered 2021-01-07 (×2): via INTRAVENOUS

## 2021-01-07 MED ORDER — ONDANSETRON (PF) 4 MG/2 ML INJECTION
4 mg/2 mL | INTRAMUSCULAR | Status: DC | PRN
Start: 2021-01-07 — End: 2021-01-07
  Administered 2021-01-07: 16:00:00 via INTRAVENOUS

## 2021-01-07 MED ORDER — NICARDIPINE IN SODIUM CHLORIDE(ISO-OSMOTIC) 40 MG/200 ML IV PIGGY BACK
40 mg/200 mL (0.2 mg/mL) | INTRAVENOUS | Status: DC
Start: 2021-01-07 — End: 2021-01-08
  Administered 2021-01-07 (×2): via INTRAVENOUS

## 2021-01-07 MED ORDER — HEPARIN (PORCINE) 1,000 UNIT/ML IJ SOLN
1000 unit/mL | INTRAMUSCULAR | Status: DC | PRN
Start: 2021-01-07 — End: 2021-01-07
  Administered 2021-01-07: 16:00:00 via INTRAVENOUS

## 2021-01-07 MED ORDER — HEPARIN 3,000 UNITS IN NORMAL SALINE 1000 ML
3000 units/1000 | INTRAVENOUS | Status: DC | PRN
Start: 2021-01-07 — End: 2021-01-07
  Administered 2021-01-07: 17:00:00 via INTRA_ARTERIAL

## 2021-01-07 MED ORDER — PROPOFOL 10 MG/ML IV EMUL
10 mg/mL | INTRAVENOUS | Status: DC | PRN
Start: 2021-01-07 — End: 2021-01-07
  Administered 2021-01-07 (×2): via INTRAVENOUS

## 2021-01-07 MED ORDER — SODIUM CHLORIDE 0.9 % IV PIGGY BACK
1 gram | INTRAVENOUS | Status: DC | PRN
Start: 2021-01-07 — End: 2021-01-07
  Administered 2021-01-07: 16:00:00 via INTRAVENOUS

## 2021-01-07 MED ORDER — IODIXANOL 270 MG/ML IV SOLN
270 mg iodine/mL | INTRAVENOUS | Status: DC | PRN
Start: 2021-01-07 — End: 2021-01-07
  Administered 2021-01-07: 17:00:00 via INTRA_ARTERIAL

## 2021-01-07 MED ORDER — HEPARIN 3,000 UNITS IN NORMAL SALINE 1000 ML
3000 units/1000 | INTRAVENOUS | Status: AC
Start: 2021-01-07 — End: ?

## 2021-01-07 MED ORDER — SODIUM CHLORIDE 0.9 % IV
INTRAVENOUS | Status: AC
Start: 2021-01-07 — End: 2021-01-07
  Administered 2021-01-07: 17:00:00 via INTRAVENOUS

## 2021-01-07 MED ORDER — VERAPAMIL 2.5 MG/ML IV
2.5 mg/mL | INTRAVENOUS | Status: AC
Start: 2021-01-07 — End: ?

## 2021-01-07 MED ORDER — PROTAMINE 10 MG/ML IV
10 mg/mL | INTRAVENOUS | Status: DC | PRN
Start: 2021-01-07 — End: 2021-01-07
  Administered 2021-01-07: 16:00:00 via INTRAVENOUS

## 2021-01-07 MED FILL — PANTOPRAZOLE 40 MG IV SOLR: 40 mg | INTRAVENOUS | Qty: 40

## 2021-01-07 MED FILL — CARDENE 40 MG/200 ML(0.2 MG/ML) IN SOD CHLOR(ISO-OSM) INTRAVENOUS SOLN: 40 mg/200 mL (0.2 mg/mL) | INTRAVENOUS | Qty: 200

## 2021-01-07 MED FILL — LEVOTHYROXINE 25 MCG TAB: 25 mcg | ORAL | Qty: 1

## 2021-01-07 MED FILL — SODIUM CHLORIDE 0.9 % IV: INTRAVENOUS | Qty: 1000

## 2021-01-07 MED FILL — HEPARIN 3,000 UNITS IN NORMAL SALINE 1000 ML: 3000 units/1000 | INTRAVENOUS | Qty: 8000

## 2021-01-07 MED FILL — CITALOPRAM 20 MG TAB: 20 mg | ORAL | Qty: 1

## 2021-01-07 MED FILL — LEVETIRACETAM IN SOD CHLORIDE (ISO-OSM) 500 MG/100 ML IV PIGGY BACK: 500 mg/100 mL | INTRAVENOUS | Qty: 100

## 2021-01-07 MED FILL — DONEPEZIL 5 MG TAB: 5 mg | ORAL | Qty: 2

## 2021-01-07 MED FILL — VERAPAMIL 2.5 MG/ML IV: 2.5 mg/mL | INTRAVENOUS | Qty: 4

## 2021-01-07 MED FILL — PRAVASTATIN 40 MG TAB: 40 mg | ORAL | Qty: 2

## 2021-01-07 MED FILL — DEXAMETHASONE SODIUM PHOSPHATE 4 MG/ML IJ SOLN: 4 mg/mL | INTRAMUSCULAR | Qty: 1

## 2021-01-07 MED FILL — FENTANYL CITRATE (PF) 50 MCG/ML IJ SOLN: 50 mcg/mL | INTRAMUSCULAR | Qty: 2

## 2021-01-07 MED FILL — AMLODIPINE 5 MG TAB: 5 mg | ORAL | Qty: 1

## 2021-01-07 NOTE — Anesthesia Pre-Procedure Evaluation (Signed)
Relevant Problems   NEUROLOGY   (+) Dementia (HCC)       Anesthetic History   No history of anesthetic complications            Review of Systems / Medical History  Patient summary reviewed and pertinent labs reviewed    Pulmonary  Within defined limits                 Neuro/Psych             Comments: Left subdural hematoma with 5.8 mm midline shift  Left frontal scalp hematoma    History of dementia Cardiovascular    Hypertension                   GI/Hepatic/Renal  Within defined limits              Endo/Other      Hypothyroidism  Arthritis     Other Findings            Physical Exam    Airway  Mallampati: II  TM Distance: 4 - 6 cm  Neck ROM: normal range of motion   Mouth opening: Normal     Cardiovascular  Regular rate and rhythm,  S1 and S2 normal,  no murmur, click, rub, or gallop  Rhythm: regular  Rate: normal         Dental         Pulmonary  Breath sounds clear to auscultation               Abdominal  GI exam deferred       Other Findings            Anesthetic Plan    ASA: 3  Anesthesia type: general          Induction: Intravenous  Anesthetic plan and risks discussed with: Patient and Family

## 2021-01-07 NOTE — Op Note (Signed)
NEUROVASCULAR BRIEF OPERATIVE NOTE    Beverly Nguyen    Date of Procedure:  01/07/2021    Surgeon:  Andrey Cota, MD    Preoperative Diagnosis:  left frontal chronic subdural hematoma    Postoperative Diagnosis:  same, left external carotid artery occlusion, left internal carotid artery severe stenosis    Procedure:  cerebral angiography     Anesthesia:  geta        Specimens:  none    Implants:  none    Estimated Blood Loss:  minimal    Findings:      LECA origin chronic occlusion.  LMMA embolization deferred.    LICA origin 70% stenosis (1.68mm residual, 4.71mm nominal).    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    Complications:  none immediate

## 2021-01-07 NOTE — Progress Notes (Signed)
Patient is open to [facility]

## 2021-01-07 NOTE — Progress Notes (Signed)
Medicine Progress Note    Patient: Beverly Nguyen   Age:  86 y.o.  DOA: 01/04/2021     LOS:  LOS: 3 days     Assessment/Plan      Left subdural hematoma with 5.8 mm midline shift  Left frontal scalp hematoma  Left shoulder nondisplaced distal clavicle fracture  Hypertension  Hyperlipidemia  Hypothyroidism    -Status post cerebral angiogram with Dr. Excell Seltzer today, no intervention or embolization performed  -Continue Keppra  -Orthopedics is seen and recommends nonoperative management for distal clavicle fracture  -When cleared by neurosurgery PT OT eval,  discussed with daughter would like patient to go to SNF on discharge  -Continue to monitor in ICU      Subjective:   Patient seen and examined. No complaints, No N/V, F/C, CP or SOB     Objective:     Visit Vitals  BP (!) 118/59   Pulse 79   Temp 97.3 ??F (36.3 ??C)   Resp 16   Wt 54.5 kg (120 lb 2.4 oz)   SpO2 99%   BMI 22.70 kg/m??       Physical Exam:  General appearance: alert, cooperative, no distress, appears stated age  Head: Left forehead hematoma lungs: clear to auscultation bilaterally  Heart: regular rate and rhythm, S1, S2 normal, no murmur, click, rub or gallop  Abdomen: soft, non-tender. Bowel sounds normal. No masses,  no organomegaly  Extremities: extremities normal, atraumatic, no cyanosis or edema  Skin: Skin color, texture, turgor normal. No rashes or lesions in exposed areas   Neurologic: Grossly normal        Medications Reviewed:  Current Facility-Administered Medications   Medication Dose Route Frequency   ??? amLODIPine (NORVASC) tablet 5 mg  5 mg Oral DAILY   ??? niCARdipine in Saline (CARDENE) 40 mg/200 mL (0.2 mg/mL) 200 ml premix infusion  0-15 mg/hr IntraVENous TITRATE   ??? levETIRAcetam (KEPPRA) 500 mg in saline (iso-osm) 100 mL IVPB (premix)  500 mg IntraVENous Q12H   ??? [Held by provider] aspirin chewable tablet 81 mg  81 mg Oral DAILY   ??? citalopram (CELEXA) tablet 20 mg  20 mg Oral DAILY   ??? donepeziL (ARICEPT) tablet 10 mg  10 mg Oral QHS   ???  levothyroxine (SYNTHROID) tablet 25 mcg  25 mcg Oral ACB   ??? metoprolol tartrate (LOPRESSOR) tablet 25 mg  25 mg Oral DAILY   ??? pravastatin (PRAVACHOL) tablet 80 mg  80 mg Oral DAILY   ??? acetaminophen (TYLENOL) tablet 650 mg  650 mg Oral Q6H PRN    Or   ??? acetaminophen (TYLENOL) solution 650 mg  650 mg Oral Q6H PRN    Or   ??? acetaminophen (TYLENOL) suppository 650 mg  650 mg Rectal Q6H PRN   ??? ondansetron (ZOFRAN) injection 4 mg  4 mg IntraVENous Q8H PRN   ??? PHOSPHATE ELECTROLYTE REPLACEMENT PROTOCOL STANDARD DOSING  1 Each Other PRN   ??? MAGNESIUM ELECTROLYTE REPLACEMENT PROTOCOL STANDARD DOSING  1 Each Other PRN   ??? POTASSIUM ELECTROLYTE REPLACEMENT PROTOCOL STANDARD DOSING  1 Each Other PRN   ??? pantoprazole (PROTONIX) 40 mg in 0.9% sodium chloride 10 mL injection  40 mg IntraVENous DAILY         Intake and Output:  Current Shift:  No intake/output data recorded.  Last three shifts:  02/22 0701 - 02/23 1900  In: 1217.1 [P.O.:600; I.V.:617.1]  Out: 970 [Urine:970]    Lab/Data Reviewed:  All lab and imaging  results for the last 24 hours reviewed.    Kristie Cowman, MD  P# [phone]  January 07, 2021    [facility] medical dictation software was used for portions of this report.  Unintended voice transcription errors may have occurred.

## 2021-01-07 NOTE — Anesthesia Post-Procedure Evaluation (Signed)
Procedure(s):  NEUROVASCULAR PROCEDURE.    general    Anesthesia Post Evaluation      Multimodal analgesia: multimodal analgesia not used between 6 hours prior to anesthesia start to PACU discharge  Patient location during evaluation: bedside  Patient participation: complete - patient participated  Level of consciousness: awake and alert  Pain score: 3  Pain management: adequate  Anesthetic complications: no  Cardiovascular status: acceptable  Respiratory status: acceptable, room air and nonlabored ventilation  Hydration status: acceptable  Post anesthesia nausea and vomiting:  none  Final Post Anesthesia Temperature Assessment:  Normothermia (36.0-37.5 degrees C)      INITIAL Post-op Vital signs:   Vitals Value Taken Time   BP 154/68 01/07/21 1220   Temp 36.1 ??C (97 ??F) 01/07/21 1208   Pulse 83 01/07/21 1220   Resp 14 01/07/21 1220   SpO2 100 % 01/07/21 1220   Vitals shown include unvalidated device data.

## 2021-01-07 NOTE — Progress Notes (Signed)
Progress Notes by Zoe Lan, MD at 01/07/21 1212                Author: Zoe Lan, MD  Service: Pulmonary Disease  Author Type: Physician       Filed: 01/07/21 1220  Date of Service: 01/07/21 1212  Status: Signed          Editor: Zoe Lan, MD (Physician)                              [facility] PULMONARY AND CRITICAL CARE MEDICINE           Progress Note         Name:  Beverly Nguyen        DOB:  December 11, 1934     MRN:  [id number]        Date:  01/07/2021      [x]  I have reviewed the flowsheet and previous days notes. Events, vitals, medications and notes from last 24 hours reviewed.         ASSESSMENT:     ??   -Recurrent fall with resultant left subdural hematoma with 5.8 mm midline shift on head CT 01/04/2021.  Repeat CT head 01/06/2021 with stable subdural hygroma with slight increase in right midline  shift of 5 mm.   -Status post cerebral angiogram by Dr. 01/08/2021 on 01/07/2021.  Showed left external carotid artery occlusion, severe stenosis of left internal carotid artery.  No intervention/embolization performed.   -Left frontal scalp hematoma   -Nondisplaced left shoulder fracture through the distal clavicle on shoulder x-ray 01/04/2021   -AKI.  Creatinine 1.4 this morning.   -Hypertension, latest echocardiogram on 12/18/2020 with EF of 70% and RVSP of 28   -Hyperlipidemia   -Hypothyroidism   -Osteoporosis   -Dementia   ??     PLAN:     ??   -Currently on room air. May use supplemental oxygen as needed and titrate settings to keep SPO2 >90%   -DuoNebs as needed   -Neurochecks and pupillometry readings per ICU protocol   -Continue recommendations per neurosurgery.     -Keep blood systolic blood pressure less than 140. Use nicardipine drip as necessary.  On p.o. metoprolol and Norvasc.   -Continue NS at 75 ml/hour per neurosurgery.    -Keep HOB at 30 degrees   -Off dexamethasone.     -Continue Keppra 500 mg IV for seizure prophylaxis neurosurgery.   -Continue recommendations per neuro  interventional.   -Sling support for comfort when mobilizing per orthopedic surgery.  No surgical intervention at this time.   -Avoid nephrotoxins. Renally dose medications. Monitor urine output. Daily weights.   -Electrolyte replacements per standard protocol.    -Glucomander per protocol   -SUP: protonix   -DVT prophylaxis: SCDs   -Transfer to telemetry neuro if okay with neurosurgery/neuro interventional.   Discussed with patient, RN.   CC time 32 minutes.                   Subjective:    Awake alert oriented to name and place.  No significant overnight events.   ROS:    Unable to obtain due to patient's condition.             Allergy:     Allergies        Allergen  Reactions         ?  Pcn [Penicillins]  Hives  Vital Signs:       Visit Vitals   BP  (!) 153/62      Pulse  69      Temp  (!) 95.9 ??F (35.5 ??C)      Resp  18      Wt  54.5 kg (120 lb 2.4 oz)      SpO2  100%      BMI  22.70 kg/m??                O2 Device: None (Room air)             Temp (24hrs), Avg:97.6 ??F (36.4 ??C), Min:95.9 ??F (35.5 ??C), Max:98.3 ??F (36.8 ??C)           Patient Vitals for the past 8 hrs:            Temp  Pulse  Resp  BP  SpO2            01/07/21 1105  (!) 95.9 ??F (35.5 ??C)  --  --  --  --            01/07/21 0902  --  69  18  (!) 153/62  100 %     01/07/21 0802  --  61  16  (!) 144/53  99 %     01/07/21 0700  --  71  14  (!) 129/48  97 %     01/07/21 0600  --  61  17  (!) 124/48  96 %            01/07/21 0500  --  68  17  (!) 111/52  100 %           Intake/Output:    Last shift:      02/23 0701 - 02/23 1900   In: 550 [I.V.:550]   Out: 350 [Urine:350]   Last 3 shifts: 02/21 1901 - 02/23 0700   In: 795 [P.O.:600; I.V.:195]   Out: 945 [Urine:945]      Intake/Output Summary (Last 24 hours) at 01/07/2021 1212   Last data filed at 01/07/2021 1153     Gross per 24 hour        Intake  977.08 ml        Output  970 ml        Net  7.08 ml           Ventilator Settings:          Mode  Rate  Tidal Volume  Pressure  FiO2  PEEP                                          Physical Exam:      GENERAL: AAO x name and place.  Left forehead hematoma.   NECK: Supple. Trachea midline.    RESPIRATORY: Bilateral BS present, decreased at bases. No rales or rhonchi.    CARDIOVASCULAR: S1 and S2 present, regular. No murmur, rub, or thrill.    ABDOMEN: Soft and nontender with positive bowel sounds. No organomegaly.    NEUROLOGICAL: No focal deficits   EXT: No edema or cyanosis.          DATA:      Current Facility-Administered Medications          Medication  Dose  Route  Frequency           ?  0.9% sodium chloride infusion   75 mL/hr  IntraVENous  CONTINUOUS     ?  amLODIPine (NORVASC) tablet 5 mg   5 mg  Oral  DAILY     ?  niCARdipine in Saline (CARDENE) 40 mg/200 mL (0.2 mg/mL) 200 ml premix infusion   0-15 mg/hr  IntraVENous  TITRATE     ?  levETIRAcetam (KEPPRA) 500 mg in saline (iso-osm) 100 mL IVPB (premix)   500 mg  IntraVENous  Q12H     ?  [Held by provider] aspirin chewable tablet 81 mg   81 mg  Oral  DAILY     ?  citalopram (CELEXA) tablet 20 mg   20 mg  Oral  DAILY     ?  donepeziL (ARICEPT) tablet 10 mg   10 mg  Oral  QHS     ?  levothyroxine (SYNTHROID) tablet 25 mcg   25 mcg  Oral  ACB           ?  metoprolol tartrate (LOPRESSOR) tablet 25 mg   25 mg  Oral  DAILY           ?  pravastatin (PRAVACHOL) tablet 80 mg   80 mg  Oral  DAILY     ?  acetaminophen (TYLENOL) tablet 650 mg   650 mg  Oral  Q6H PRN          Or           ?  acetaminophen (TYLENOL) solution 650 mg   650 mg  Oral  Q6H PRN          Or           ?  acetaminophen (TYLENOL) suppository 650 mg   650 mg  Rectal  Q6H PRN     ?  ondansetron (ZOFRAN) injection 4 mg   4 mg  IntraVENous  Q8H PRN     ?  PHOSPHATE ELECTROLYTE REPLACEMENT PROTOCOL STANDARD DOSING   1 Each  Other  PRN     ?  MAGNESIUM ELECTROLYTE REPLACEMENT PROTOCOL STANDARD DOSING   1 Each  Other  PRN     ?  POTASSIUM ELECTROLYTE REPLACEMENT PROTOCOL STANDARD DOSING   1 Each  Other  PRN           ?  pantoprazole (PROTONIX) 40 mg in  0.9% sodium chloride 10 mL injection   40 mg  IntraVENous  DAILY          Facility-Administered Medications Ordered in Other Encounters          Medication  Dose  Route  Frequency           ?  0.9% sodium chloride infusion     IntraVENous  CONTINUOUS     ?  0.9% sodium chloride infusion     IntraVENous  CONTINUOUS     ?  lidocaine (PF) (XYLOCAINE) 20 mg/mL (2 %) injection     IntraVENous  PRN     ?  propofoL (DIPRIVAN) 10 mg/mL injection     IntraVENous  PRN     ?  rocuronium injection     IntraVENous  PRN     ?  ondansetron (ZOFRAN) injection     IntraVENous  PRN           ?  ceFAZolin (ANCEF) 1 g in 0.9% sodium chloride (MBP/ADV) 50 mL MBP     IntraVENous  CONTINUOUS           ?  fentaNYL citrate (PF) injection     IntraVENous  PRN     ?  heparin (porcine) 1,000 unit/mL injection     IntraVENous  PRN     ?  protamine injection     IntraVENous  PRN           ?  sugammadex (BRIDION) 100 mg/mL injection     IntraVENous  PRN                    Labs:   Reviewed.      Recent Results (from the past 24 hour(s))     GLUCOSE, POC          Collection Time: 01/06/21  5:05 PM         Result  Value  Ref Range            Glucose (POC)  148 (H)  65 - 105 mg/dL       GLUCOSE, POC          Collection Time: 01/06/21 11:11 PM         Result  Value  Ref Range            Glucose (POC)  153 (H)  65 - 105 mg/dL       CBC W/O DIFF          Collection Time: 01/07/21  1:45 AM         Result  Value  Ref Range            WBC  14.1 (H)  4.0 - 11.0 1000/mm3       RBC  3.38 (L)  3.60 - 5.20 M/uL       HGB  9.5 (L)  13.0 - 17.2 gm/dl       HCT  82.929.6 (L)  56.237.0 - 50.0 %       MCV  87.6  80.0 - 98.0 fL       MCH  28.1  25.4 - 34.6 pg       MCHC  32.1  30.0 - 36.0 gm/dl       PLATELET  130303  865140 - 450 1000/mm3       MPV  9.7  6.0 - 10.0 fL       RDW-SD  47.0 (H)  36.4 - 46.3         METABOLIC PANEL, BASIC          Collection Time: 01/07/21  1:45 AM         Result  Value  Ref Range            Sodium  137  136 - 145 mEq/L       Potassium  4.2  3.5 - 5.1  mEq/L            Chloride  110 (H)  98 - 107 mEq/L            CO2  23  21 - 32 mEq/L       Glucose  136 (H)  74 - 106 mg/dl       BUN  28 (H)  7 - 25 mg/dl       Creatinine  1.4 (H)  0.6 - 1.3 mg/dl       GFR est AA  78.446.0          GFR est non-AA  38          Calcium  8.5  8.5 -  10.1 mg/dl       Anion gap  4 (L)  5 - 15 mmol/L       BLOOD TYPE, (ABO+RH)          Collection Time: 01/07/21  1:45 AM         Result  Value  Ref Range            ABO/Rh(D)  O Rh Positive          ANTIBODY SCREEN          Collection Time: 01/07/21  1:45 AM         Result  Value  Ref Range            Antibody screen  NEG          GLUCOSE, POC          Collection Time: 01/07/21  6:00 AM         Result  Value  Ref Range            Glucose (POC)  155 (H)  65 - 105 mg/dL             Imaging:   Personally reviewed.                Zoe Lan, MD         Dragon medical dictation software was used for portions of this report. Unintended voice transcription errors may have occurred.

## 2021-01-07 NOTE — Progress Notes (Signed)
Progress  Notes by Bethann Goo, MD at 01/07/21 [phone]                Author: Bethann Goo, MD  Service: Neurosurgery  Author Type: Physician       Filed: 01/07/21 0842  Date of Service: 01/07/21 0841  Status: Signed          Editor: Bethann Goo, MD (Physician)                        NEUROSURGERY PROGRESS NOTE       SURGERY DATE: 01/04/2021               POD#    Dx:  Subdural hematoma (HCC) [S06.5X9A]   Fall [W19.XXXA]   Dementia (HCC) [F03.90]          SUBJECTIVE:   No reported changes      EXAM:       Vitals:             01/07/21 0400  01/07/21 0500  01/07/21 0600  01/07/21 0700           BP:  (!) 126/48  (!) 111/52  (!) 124/48  (!) 129/48     Pulse:  64  68  61  71     Resp:  17  17  17  14      Temp:  97.5 ??F (36.4 ??C)           SpO2:  97%  100%  96%  97%           Weight:                   Fairly alert and appropriate. Slightly confused at baseline Motor and sensory function are stable.        LABS:      Recent Results (from the past 24 hour(s))     GLUCOSE, POC          Collection Time: 01/06/21 11:23 AM         Result  Value  Ref Range            Glucose (POC)  189 (H)  65 - 105 mg/dL       GLUCOSE, POC          Collection Time: 01/06/21  5:05 PM         Result  Value  Ref Range            Glucose (POC)  148 (H)  65 - 105 mg/dL       GLUCOSE, POC          Collection Time: 01/06/21 11:11 PM         Result  Value  Ref Range            Glucose (POC)  153 (H)  65 - 105 mg/dL       CBC W/O DIFF          Collection Time: 01/07/21  1:45 AM         Result  Value  Ref Range            WBC  14.1 (H)  4.0 - 11.0 1000/mm3            RBC  3.38 (L)  3.60 - 5.20 M/uL       HGB  9.5 (L)  13.0 - 17.2 gm/dl  HCT  29.6 (L)  37.0 - 50.0 %       MCV  87.6  80.0 - 98.0 fL       MCH  28.1  25.4 - 34.6 pg       MCHC  32.1  30.0 - 36.0 gm/dl       PLATELET  630  160 - 450 1000/mm3       MPV  9.7  6.0 - 10.0 fL       RDW-SD  47.0 (H)  36.4 - 46.3         METABOLIC PANEL, BASIC          Collection Time: 01/07/21  1:45 AM          Result  Value  Ref Range            Sodium  137  136 - 145 mEq/L       Potassium  4.2  3.5 - 5.1 mEq/L       Chloride  110 (H)  98 - 107 mEq/L       CO2  23  21 - 32 mEq/L       Glucose  136 (H)  74 - 106 mg/dl       BUN  28 (H)  7 - 25 mg/dl       Creatinine  1.4 (H)  0.6 - 1.3 mg/dl       GFR est AA  10.9          GFR est non-AA  38          Calcium  8.5  8.5 - 10.1 mg/dl       Anion gap  4 (L)  5 - 15 mmol/L       BLOOD TYPE, (ABO+RH)          Collection Time: 01/07/21  1:45 AM         Result  Value  Ref Range            ABO/Rh(D)  O Rh Positive          ANTIBODY SCREEN          Collection Time: 01/07/21  1:45 AM         Result  Value  Ref Range            Antibody screen  NEG          GLUCOSE, POC          Collection Time: 01/07/21  6:00 AM         Result  Value  Ref Range            Glucose (POC)  155 (H)  65 - 105 mg/dL                  IMPRESSION: L frontal hygroma      PLAN:     MMA embo today   Neuro checks Q4 h   Dc decadron   Will follow peripherally      Bethann Goo, MD   January 07, 2021   8:09 AM

## 2021-01-08 LAB — METABOLIC PANEL, BASIC
Anion gap: 5 mmol/L (ref 5–15)
BUN: 25 mg/dl (ref 7–25)
CO2: 22 mEq/L (ref 21–32)
Calcium: 8 mg/dl — ABNORMAL LOW (ref 8.5–10.1)
Chloride: 111 mEq/L — ABNORMAL HIGH (ref 98–107)
Creatinine: 1.2 mg/dl (ref 0.6–1.3)
GFR est AA: 55
GFR est non-AA: 46
Glucose: 110 mg/dl — ABNORMAL HIGH (ref 74–106)
Potassium: 4.1 mEq/L (ref 3.5–5.1)
Sodium: 138 mEq/L (ref 136–145)

## 2021-01-08 LAB — CBC W/O DIFF
HCT: 26.1 % — ABNORMAL LOW (ref 37.0–50.0)
HGB: 8.5 gm/dl — ABNORMAL LOW (ref 13.0–17.2)
MCH: 28.3 pg (ref 25.4–34.6)
MCHC: 32.6 gm/dl (ref 30.0–36.0)
MCV: 87 fL (ref 80.0–98.0)
MPV: 9.5 fL (ref 6.0–10.0)
PLATELET: 271 10*3/uL (ref 140–450)
RBC: 3 M/uL — ABNORMAL LOW (ref 3.60–5.20)
RDW-SD: 46.9 — ABNORMAL HIGH (ref 36.4–46.3)
WBC: 10.1 10*3/uL (ref 4.0–11.0)

## 2021-01-08 LAB — GLUCOSE, POC
Glucose (POC): 119 mg/dL — ABNORMAL HIGH (ref 65–105)
Glucose (POC): 152 mg/dL — ABNORMAL HIGH (ref 65–105)
Glucose (POC): 106 mg/dL — ABNORMAL HIGH (ref 65–105)

## 2021-01-08 LAB — POCT GLUCOSE
POC Glucose: 119 mg/dL — ABNORMAL HIGH (ref 65–105)
POC Glucose: 152 mg/dL — ABNORMAL HIGH (ref 65–105)
POC Glucose: 106 mg/dL — ABNORMAL HIGH (ref 65–105)

## 2021-01-08 LAB — CBC
Hematocrit: 26.1 % — ABNORMAL LOW (ref 37.0–50.0)
Hemoglobin: 8.5 gm/dl — ABNORMAL LOW (ref 13.0–17.2)
MCH: 28.3 pg (ref 25.4–34.6)
MCHC: 32.6 gm/dl (ref 30.0–36.0)
MCV: 87 fL (ref 80.0–98.0)
MPV: 9.5 fL (ref 6.0–10.0)
Platelets: 271 10*3/uL (ref 140–450)
RBC: 3 M/uL — ABNORMAL LOW (ref 3.60–5.20)
RDW-SD: 46.9 — ABNORMAL HIGH (ref 36.4–46.3)
WBC: 10.1 10*3/uL (ref 4.0–11.0)

## 2021-01-08 LAB — BASIC METABOLIC PANEL
Anion Gap: 5 mmol/L (ref 5–15)
BUN: 25 mg/dl (ref 7–25)
CO2: 22 mEq/L (ref 21–32)
Calcium: 8 mg/dl — ABNORMAL LOW (ref 8.5–10.1)
Chloride: 111 mEq/L — ABNORMAL HIGH (ref 98–107)
Creatinine: 1.2 mg/dl (ref 0.6–1.3)
EGFR IF NonAfrican American: 46
GFR African American: 55
Glucose: 110 mg/dl — ABNORMAL HIGH (ref 74–106)
Potassium: 4.1 mEq/L (ref 3.5–5.1)
Sodium: 138 mEq/L (ref 136–145)

## 2021-01-08 MED FILL — LEVETIRACETAM IN SOD CHLORIDE (ISO-OSM) 500 MG/100 ML IV PIGGY BACK: 500 mg/100 mL | INTRAVENOUS | Qty: 100

## 2021-01-08 MED FILL — LEVOTHYROXINE 25 MCG TAB: 25 mcg | ORAL | Qty: 1

## 2021-01-08 MED FILL — METOPROLOL TARTRATE 25 MG TAB: 25 mg | ORAL | Qty: 1

## 2021-01-08 MED FILL — PANTOPRAZOLE 40 MG IV SOLR: 40 mg | INTRAVENOUS | Qty: 40

## 2021-01-08 MED FILL — TYLENOL 325 MG TABLET: 325 mg | ORAL | Qty: 2

## 2021-01-08 MED FILL — ACETAMINOPHEN 325 MG/10.15 ML ORAL SOLN: 325 mg/10.15 mL | ORAL | Qty: 20.3

## 2021-01-08 MED FILL — PRAVASTATIN 40 MG TAB: 40 mg | ORAL | Qty: 2

## 2021-01-08 MED FILL — AMLODIPINE 5 MG TAB: 5 mg | ORAL | Qty: 1

## 2021-01-08 MED FILL — CITALOPRAM 20 MG TAB: 20 mg | ORAL | Qty: 1

## 2021-01-08 MED FILL — DONEPEZIL 5 MG TAB: 5 mg | ORAL | Qty: 2

## 2021-01-08 NOTE — Progress Notes (Signed)
Neurovascular Progress Note      Patient: Beverly Nguyen MRN: [id number]  SSN: [id number]    Date of Birth: [birth year]  Age: 86 y.o.  Sex: female      CC: enlarging left frontal chronic subdural hematoma    Events  No acute neurologic events overnight.  Cerebral angiography attempted yesterday for LMMA embolization was deferred secondary to [facility] origin chronic occlusion and LICA origin 70% stenosis.  She did well during post-procedure follow up in the ICU.        Vital Signs  Patient Vitals for the past 24 hrs:   Temp Pulse Resp BP SpO2   01/08/21 0925 ??? 74 ??? ??? ???   01/08/21 0900 ??? (!) 59 17 (!) 159/58 100 %   01/08/21 0800 ??? 72 15 (!) 149/57 100 %   01/08/21 0715 ??? ??? ??? (!) 140/74 ???   01/08/21 0700 ??? (!) 56 17 ??? 100 %   01/08/21 0600 ??? 62 18 (!) 141/59 100 %   01/08/21 0504 ??? ??? ??? (!) 156/61 ???   01/08/21 0500 ??? (!) 55 16 ??? 100 %   01/08/21 0400 98.6 ??F (37 ??C) 62 18 (!) 143/60 100 %   01/08/21 0300 ??? (!) 58 15 136/60 100 %   01/08/21 0200 ??? (!) 58 16 (!) 148/59 100 %   01/08/21 0100 ??? 64 15 (!) 142/60 100 %   01/08/21 0000 98.1 ??F (36.7 ??C) 67 18 (!) 143/56 100 %   01/07/21 2300 ??? 64 16 (!) 141/66 100 %   01/07/21 2200 ??? 67 20 (!) 135/57 100 %   01/07/21 2100 ??? 68 16 136/61 98 %   01/07/21 2037 97.9 ??F (36.6 ??C) ??? ??? ??? ???   01/07/21 2000 97.9 ??F (36.6 ??C) 64 16 126/61 98 %   01/07/21 1902 ??? 79 16 (!) 118/59 99 %   01/07/21 1900 ??? 63 16 ??? 100 %   01/07/21 1802 ??? 66 16 (!) 154/62 100 %   01/07/21 1702 ??? 74 16 (!) 144/67 100 %   01/07/21 1602 ??? 66 19 (!) 145/58 99 %   01/07/21 1547 ??? 64 15 (!) 134/57 100 %   01/07/21 1532 ??? 60 14 129/62 100 %   01/07/21 1517 ??? 67 16 (!) 141/57 100 %   01/07/21 1502 ??? 77 13 134/74 99 %   01/07/21 1447 ??? 67 16 (!) 129/54 100 %   01/07/21 1432 ??? 76 14 (!) 151/65 100 %   01/07/21 1417 ??? 75 16 (!) 148/69 100 %   01/07/21 1402 ??? 72 17 (!) 143/57 100 %   01/07/21 1347 ??? 69 13 132/65 100 %   01/07/21 1332 ??? 71 13 (!) 126/50 100 %   01/07/21 1317 ??? 73 13 121/72 100 %   01/07/21 1302 ??? 73 11 (!) 148/61  100 %   01/07/21 1247 ??? 78 12 (!) 153/67 100 %   01/07/21 1232 ??? 74 13 (!) 155/63 100 %   01/07/21 1221 97.3 ??F (36.3 ??C) 83 14 (!) 154/68 100 %   01/07/21 1208 97 ??F (36.1 ??C) 88 16 (!) 140/74 100 %   01/07/21 1105 (!) 95.9 ??F (35.5 ??C) ??? ??? ??? ???       NIHSS Flow Sheet  Total: 0 (01/08/21 0800)     Vent  None    Intake and Output    Intake/Output Summary (Last 24 hours) at 01/08/2021 6256  Last data filed  at 01/08/2021 1610  Gross per 24 hour   Intake 650 ml   Output 725 ml   Net -75 ml       Data    Recent Results (from the past 24 hour(s))   GLUCOSE, POC    Collection Time: 01/07/21 11:46 PM   Result Value Ref Range    Glucose (POC) 119 (H) 65 - 105 mg/dL   METABOLIC PANEL, BASIC    Collection Time: 01/08/21  2:05 AM   Result Value Ref Range    Potassium 4.1 3.5 - 5.1 mEq/L    Chloride 111 (H) 98 - 107 mEq/L    Sodium 138 136 - 145 mEq/L    CO2 22 21 - 32 mEq/L    Glucose 110 (H) 74 - 106 mg/dl    BUN 25 7 - 25 mg/dl    Creatinine 1.2 0.6 - 1.3 mg/dl    GFR est AA 96.0      GFR est non-AA 46      Calcium 8.0 (L) 8.5 - 10.1 mg/dl    Anion gap 5 5 - 15 mmol/L   CBC W/O DIFF    Collection Time: 01/08/21  2:05 AM   Result Value Ref Range    WBC 10.1 4.0 - 11.0 1000/mm3    RBC 3.00 (L) 3.60 - 5.20 M/uL    HGB 8.5 (L) 13.0 - 17.2 gm/dl    HCT 45.4 (L) 09.8 - 50.0 %    MCV 87.0 80.0 - 98.0 fL    MCH 28.3 25.4 - 34.6 pg    MCHC 32.6 30.0 - 36.0 gm/dl    PLATELET 119 147 - 829 1000/mm3    MPV 9.5 6.0 - 10.0 fL    RDW-SD 46.9 (H) 36.4 - 46.3          Physical Exam  Supine in bed, HOB 45 degrees.  Awake, alert, oriented to person, place, year.  Mostly full speech without dysarthria.  LT symmetric.  Face symmetric.  Good strength with mild asymmetric weakness on the right.  Left scalp hematoma.  Right groin dressing removed, some ecchymosis but not hematoma, slight tenderness to palpation.  Right lower back / flank soft, non-tender, no discoloration.  Right foot warm, pink.      Assessment/Plan  86 year old female hospital day 4  with enlarging left frontal chronic subdural hematoma.    Unfortunately, LMMA embolization was deferred secondary to findings during cerebral angiography of LECA occlusion and LICA severe stenosis.  She continues to be followed by Dr. Su Ley.     Okay to leave ICU from neurovascular perspective.  PT and OT as tolerated.  OOB per Dr. Su Ley and primary team.    Recommend follow up carotid ultrasound in 6 months and vascular surgery follow up afterwards.  She may already have established vascular surgery specialist as it appears she had CUS approximately 2 weeks ago.  No antiplatelet for now.      45 minutes of neurologic care time was provided independent of procedures.    Nolon Bussing. Rhemi Balbach, ACNPC-AG

## 2021-01-08 NOTE — Progress Notes (Signed)
Medicine Progress Note    Patient: Beverly Nguyen   Age:  86 y.o.  DOA: 01/04/2021     LOS:  LOS: 4 days     Assessment/Plan      Left subdural hematoma with 5.8 mm midline shift  Left frontal scalp hematoma  Left shoulder nondisplaced distal clavicle fracture  Hypertension  Hyperlipidemia  Hypothyroidism    -Status post cerebral angiogram with Dr. Excell Seltzer 2/23, no intervention or embolization performed  -Continue Keppra  -Orthopedics is seen and recommends nonoperative management for distal clavicle fracture  -Transfer to floor  -PT OT eval    Subjective:   Patient seen and examined. No complaints, No N/V, F/C, CP or SOB     Objective:     Visit Vitals  BP (!) 126/52 (BP 1 Location: Right arm, BP Patient Position: Supine)   Pulse (!) 56   Temp 97.5 ??F (36.4 ??C)   Resp 17   Wt 54.5 kg (120 lb 2.4 oz)   SpO2 100%   BMI 22.70 kg/m??       Physical Exam:  General appearance: alert, cooperative, no distress, appears stated age  Head: Left forehead hematoma lungs: clear to auscultation bilaterally  Heart: regular rate and rhythm, S1, S2 normal, no murmur, click, rub or gallop  Abdomen: soft, non-tender. Bowel sounds normal. No masses,  no organomegaly  Extremities: extremities normal, atraumatic, no cyanosis or edema  Skin: Skin color, texture, turgor normal. No rashes or lesions in exposed areas   Neurologic: Grossly normal        Medications Reviewed:  Current Facility-Administered Medications   Medication Dose Route Frequency   ??? amLODIPine (NORVASC) tablet 5 mg  5 mg Oral DAILY   ??? levETIRAcetam (KEPPRA) 500 mg in saline (iso-osm) 100 mL IVPB (premix)  500 mg IntraVENous Q12H   ??? [Held by provider] aspirin chewable tablet 81 mg  81 mg Oral DAILY   ??? citalopram (CELEXA) tablet 20 mg  20 mg Oral DAILY   ??? donepeziL (ARICEPT) tablet 10 mg  10 mg Oral QHS   ??? levothyroxine (SYNTHROID) tablet 25 mcg  25 mcg Oral ACB   ??? metoprolol tartrate (LOPRESSOR) tablet 25 mg  25 mg Oral DAILY   ??? pravastatin (PRAVACHOL) tablet 80 mg   80 mg Oral DAILY   ??? acetaminophen (TYLENOL) tablet 650 mg  650 mg Oral Q6H PRN    Or   ??? acetaminophen (TYLENOL) solution 650 mg  650 mg Oral Q6H PRN    Or   ??? acetaminophen (TYLENOL) suppository 650 mg  650 mg Rectal Q6H PRN   ??? ondansetron (ZOFRAN) injection 4 mg  4 mg IntraVENous Q8H PRN   ??? PHOSPHATE ELECTROLYTE REPLACEMENT PROTOCOL STANDARD DOSING  1 Each Other PRN   ??? MAGNESIUM ELECTROLYTE REPLACEMENT PROTOCOL STANDARD DOSING  1 Each Other PRN   ??? POTASSIUM ELECTROLYTE REPLACEMENT PROTOCOL STANDARD DOSING  1 Each Other PRN   ??? pantoprazole (PROTONIX) 40 mg in 0.9% sodium chloride 10 mL injection  40 mg IntraVENous DAILY         Intake and Output:  Current Shift:  02/24 0701 - 02/24 1900  In: 120 [P.O.:120]  Out: 150 [Urine:150]  Last three shifts:  02/22 1901 - 02/24 0700  In: 837.1 [P.O.:120; I.V.:717.1]  Out: 1095 [Urine:1095]    Lab/Data Reviewed:  All lab and imaging  results for the last 24 hours reviewed.    Kristie Cowman, MD  P# [phone]  January 08, 2021  Dragon medical dictation software was used for portions of this report.  Unintended voice transcription errors may have occurred.

## 2021-01-08 NOTE — Progress Notes (Signed)
Progress Notes by Zoe Lan, MD at 01/08/21 [phone]                Author: Zoe Lan, MD  Service: Pulmonary Disease  Author Type: Physician       Filed: 01/08/21 0915  Date of Service: 01/08/21 0908  Status: Signed          Editor: Zoe Lan, MD (Physician)                              [facility] PULMONARY AND CRITICAL CARE MEDICINE           Progress Note         Name:  Beverly Nguyen        DOB:  07/16/1935     MRN:  [id number]        Date:  01/08/2021      [x]  I have reviewed the flowsheet and previous days notes. Events, vitals, medications and notes from last 24 hours reviewed.         ASSESSMENT:     ??   -Recurrent fall with resultant left subdural hematoma with 5.8 mm midline shift on head CT 01/04/2021.  Repeat CT head 01/06/2021 with stable subdural hygroma with slight increase in right midline  shift of 5 mm.   -Status post cerebral angiogram by Dr. 01/08/2021 on 01/07/2021.  Showed left external carotid artery occlusion, severe stenosis of left internal carotid artery.  No intervention/embolization performed.   -Left frontal scalp hematoma   -Nondisplaced left shoulder fracture through the distal clavicle on shoulder x-ray 01/04/2021   -AKI.  Creatinine 1.4 this morning.   -Hypertension, latest echocardiogram on 12/18/2020 with EF of 70% and RVSP of 28   -Hyperlipidemia   -Hypothyroidism   -Osteoporosis   -Dementia   ??     PLAN:     ??   -Currently on room air.    -DuoNebs as needed   -Neurochecks  neurosurgery.   -Continue recommendations per neurosurgery.     -Keep blood systolic blood pressure less than 140. On p.o. metoprolol and Norvasc.  Off Cardene.   -DC IV fluids.      -Keep HOB at 30 degrees   -Off dexamethasone.     -Continue Keppra 500 mg IV for seizure prophylaxis neurosurgery.   -Continue recommendations per neuro interventional.   -Sling support for comfort when mobilizing per orthopedic surgery.  No surgical intervention at this time.   -Avoid nephrotoxins. Renally dose  medications. Monitor urine output. Daily weights.   -Electrolyte replacements per standard protocol.    -Glucomander per protocol   -SUP: protonix   -DVT prophylaxis: SCDs   -Transfer to telemetry neuro floor.   Discussed with patient, RN.   We will sign off.                   Subjective:    Awake alert oriented to name and place.  No significant overnight events.  Room air.   ROS:    As above.  Rest negative.            Allergy:     Allergies        Allergen  Reactions         ?  Pcn [Penicillins]  Hives            Vital Signs:       Visit Vitals   BP  02/15/2021)  140/74      Pulse  72      Temp  98.6 ??F (37 ??C)      Resp  15      Wt  54.5 kg (120 lb 2.4 oz)      SpO2  100%      BMI  22.70 kg/m??                O2 Device: None (Room air)             Temp (24hrs), Avg:97.5 ??F (36.4 ??C), Min:95.9 ??F (35.5 ??C), Max:98.6 ??F (37 ??C)           Patient Vitals for the past 8 hrs:            Temp  Pulse  Resp  BP  SpO2            01/08/21 0800  --  72  15  --  100 %            01/08/21 0715  --  --  --  (!) 140/74  --     01/08/21 0700  --  (!) 56  17  --  100 %     01/08/21 0600  --  62  18  (!) 141/59  100 %     01/08/21 0504  --  --  --  (!) 156/61  --     01/08/21 0500  --  (!) 55  16  --  100 %     01/08/21 0400  98.6 ??F (37 ??C)  62  18  (!) 143/60  100 %     01/08/21 0300  --  (!) 58  15  136/60  100 %            01/08/21 0200  --  (!) 58  16  (!) 148/59  100 %           Intake/Output:    Last shift:      No intake/output data recorded.   Last 3 shifts: 02/22 1901 - 02/24 0700   In: 837.1 [P.O.:120; I.V.:717.1]   Out: 1095 [Urine:1095]      Intake/Output Summary (Last 24 hours) at 01/08/2021 0908   Last data filed at 01/08/2021 0814     Gross per 24 hour        Intake  650 ml        Output  725 ml        Net  -75 ml           Ventilator Settings:          Mode  Rate  Tidal Volume  Pressure  FiO2  PEEP                                         Physical Exam:      GENERAL: AAO x name and place.  Left forehead hematoma.   NECK:  Supple. Trachea midline.    RESPIRATORY: Bilateral BS present, decreased at bases. No rales or rhonchi.    CARDIOVASCULAR: S1 and S2 present, regular. No murmur, rub, or thrill.    ABDOMEN: Soft and nontender with positive bowel sounds. No organomegaly.    NEUROLOGICAL: No focal deficits   EXT: No edema or cyanosis.          DATA:  Current Facility-Administered Medications          Medication  Dose  Route  Frequency           ?  amLODIPine (NORVASC) tablet 5 mg   5 mg  Oral  DAILY     ?  niCARdipine in Saline (CARDENE) 40 mg/200 mL (0.2 mg/mL) 200 ml premix infusion   0-15 mg/hr  IntraVENous  TITRATE     ?  levETIRAcetam (KEPPRA) 500 mg in saline (iso-osm) 100 mL IVPB (premix)   500 mg  IntraVENous  Q12H     ?  [Held by provider] aspirin chewable tablet 81 mg   81 mg  Oral  DAILY     ?  citalopram (CELEXA) tablet 20 mg   20 mg  Oral  DAILY     ?  donepeziL (ARICEPT) tablet 10 mg   10 mg  Oral  QHS     ?  levothyroxine (SYNTHROID) tablet 25 mcg   25 mcg  Oral  ACB     ?  metoprolol tartrate (LOPRESSOR) tablet 25 mg   25 mg  Oral  DAILY     ?  pravastatin (PRAVACHOL) tablet 80 mg   80 mg  Oral  DAILY     ?  acetaminophen (TYLENOL) tablet 650 mg   650 mg  Oral  Q6H PRN          Or           ?  acetaminophen (TYLENOL) solution 650 mg   650 mg  Oral  Q6H PRN          Or           ?  acetaminophen (TYLENOL) suppository 650 mg   650 mg  Rectal  Q6H PRN     ?  ondansetron (ZOFRAN) injection 4 mg   4 mg  IntraVENous  Q8H PRN     ?  PHOSPHATE ELECTROLYTE REPLACEMENT PROTOCOL STANDARD DOSING   1 Each  Other  PRN     ?  MAGNESIUM ELECTROLYTE REPLACEMENT PROTOCOL STANDARD DOSING   1 Each  Other  PRN     ?  POTASSIUM ELECTROLYTE REPLACEMENT PROTOCOL STANDARD DOSING   1 Each  Other  PRN           ?  pantoprazole (PROTONIX) 40 mg in 0.9% sodium chloride 10 mL injection   40 mg  IntraVENous  DAILY                    Labs:   Reviewed.      Recent Results (from the past 24 hour(s))     GLUCOSE, POC          Collection Time:  01/07/21 11:46 PM         Result  Value  Ref Range            Glucose (POC)  119 (H)  65 - 105 mg/dL       METABOLIC PANEL, BASIC          Collection Time: 01/08/21  2:05 AM         Result  Value  Ref Range            Potassium  4.1  3.5 - 5.1 mEq/L       Chloride  111 (H)  98 - 107 mEq/L       Sodium  138  136 - 145 mEq/L  CO2  22  21 - 32 mEq/L       Glucose  110 (H)  74 - 106 mg/dl       BUN  25  7 - 25 mg/dl       Creatinine  1.2  0.6 - 1.3 mg/dl       GFR est AA  09.855.0          GFR est non-AA  46          Calcium  8.0 (L)  8.5 - 10.1 mg/dl       Anion gap  5  5 - 15 mmol/L       CBC W/O DIFF          Collection Time: 01/08/21  2:05 AM         Result  Value  Ref Range            WBC  10.1  4.0 - 11.0 1000/mm3       RBC  3.00 (L)  3.60 - 5.20 M/uL       HGB  8.5 (L)  13.0 - 17.2 gm/dl       HCT  11.926.1 (L)  14.737.0 - 50.0 %       MCV  87.0  80.0 - 98.0 fL       MCH  28.3  25.4 - 34.6 pg       MCHC  32.6  30.0 - 36.0 gm/dl       PLATELET  829271  562140 - 450 1000/mm3       MPV  9.5  6.0 - 10.0 fL            RDW-SD  46.9 (H)  36.4 - 46.3               Imaging:   Personally reviewed.                Zoe LanNaveen C Anil Havard, MD         Dragon medical dictation software was used for portions of this report. Unintended voice transcription errors may have occurred.

## 2021-01-08 NOTE — Progress Notes (Signed)
MMA was not able to be done.  Surgery not needed at present.   Follow up with me in a couple weeks.

## 2021-01-08 NOTE — Progress Notes (Signed)
Waiting for PT/OT recommendations.  Family would like for patient to go to SNF.  Will continue to follow for recommendations.  Son, Akeelah Seppala [phone], would like [facility] as first choice.  He has given permission to load patient into [facility] [employer].      Please note:  Jaleah Lefevre is patient's ex daughter in law.

## 2021-01-09 LAB — GLUCOSE, POC
Glucose (POC): 133 mg/dL — ABNORMAL HIGH (ref 65–105)
Glucose (POC): 134 mg/dL — ABNORMAL HIGH (ref 65–105)
Glucose (POC): 168 mg/dL — ABNORMAL HIGH (ref 65–105)
Glucose (POC): 90 mg/dL (ref 65–105)
Glucose (POC): 96 mg/dL (ref 65–105)

## 2021-01-09 LAB — POCT GLUCOSE
POC Glucose: 133 mg/dL — ABNORMAL HIGH (ref 65–105)
POC Glucose: 134 mg/dL — ABNORMAL HIGH (ref 65–105)
POC Glucose: 168 mg/dL — ABNORMAL HIGH (ref 65–105)
POC Glucose: 90 mg/dL (ref 65–105)
POC Glucose: 96 mg/dL (ref 65–105)

## 2021-01-09 MED ORDER — LEVOTHYROXINE 25 MCG TAB
25 mcg | ORAL | Status: DC
Start: 2021-01-09 — End: 2021-01-13
  Administered 2021-01-10 – 2021-01-13 (×4): via ORAL

## 2021-01-09 MED FILL — DONEPEZIL 10 MG TAB: 10 mg | ORAL | Qty: 1

## 2021-01-09 MED FILL — PANTOPRAZOLE 40 MG IV SOLR: 40 mg | INTRAVENOUS | Qty: 40

## 2021-01-09 MED FILL — LEVOTHYROXINE 25 MCG TAB: 25 mcg | ORAL | Qty: 1

## 2021-01-09 MED FILL — LEVETIRACETAM IN SOD CHLORIDE (ISO-OSM) 500 MG/100 ML IV PIGGY BACK: 500 mg/100 mL | INTRAVENOUS | Qty: 100

## 2021-01-09 MED FILL — METOPROLOL TARTRATE 25 MG TAB: 25 mg | ORAL | Qty: 1

## 2021-01-09 MED FILL — AMLODIPINE 5 MG TAB: 5 mg | ORAL | Qty: 1

## 2021-01-09 MED FILL — PRAVASTATIN 40 MG TAB: 40 mg | ORAL | Qty: 2

## 2021-01-09 MED FILL — CITALOPRAM 20 MG TAB: 20 mg | ORAL | Qty: 1

## 2021-01-09 NOTE — Progress Notes (Signed)
Medicine Progress Note    Patient: Beverly Nguyen   Age:  86 y.o.  DOA: 01/04/2021     LOS:  LOS: 5 days     Assessment/Plan      Left subdural hematoma with 5.8 mm midline shift  Left frontal scalp hematoma  Left shoulder nondisplaced distal clavicle fracture  Hypertension  Hyperlipidemia  Hypothyroidism    -Status post cerebral angiogram with Dr. Excell Seltzer 2/23, no intervention or embolization performed  -Continue Keppra  -Orthopedics is seen and recommends nonoperative management for distal clavicle fracture  -PT OT eval, SNF on discharge    Discussed with son at bedside    Subjective:   Patient seen and examined. No complaints, No N/V, F/C, CP or SOB     Objective:     Visit Vitals  BP (!) 117/48 (BP 1 Location: Right arm, BP Patient Position: Supine)   Pulse 62   Temp 97.8 ??F (36.6 ??C)   Resp 18   Wt 59.3 kg (130 lb 11.7 oz)   SpO2 99%   BMI 24.70 kg/m??       Physical Exam:  General appearance: alert, cooperative, no distress, appears stated age  Head: Left forehead hematoma lungs: clear to auscultation bilaterally  Heart: regular rate and rhythm, S1, S2 normal, no murmur, click, rub or gallop  Abdomen: soft, non-tender. Bowel sounds normal. No masses,  no organomegaly  Extremities: extremities normal, atraumatic, no cyanosis or edema  Skin: Skin color, texture, turgor normal. No rashes or lesions in exposed areas   Neurologic: Grossly normal        Medications Reviewed:  Current Facility-Administered Medications   Medication Dose Route Frequency   ??? [START ON 01/10/2021] levothyroxine (SYNTHROID) tablet 25 mcg  25 mcg Oral 6am   ??? amLODIPine (NORVASC) tablet 5 mg  5 mg Oral DAILY   ??? levETIRAcetam (KEPPRA) 500 mg in saline (iso-osm) 100 mL IVPB (premix)  500 mg IntraVENous Q12H   ??? [Held by provider] aspirin chewable tablet 81 mg  81 mg Oral DAILY   ??? citalopram (CELEXA) tablet 20 mg  20 mg Oral DAILY   ??? donepeziL (ARICEPT) tablet 10 mg  10 mg Oral QHS   ??? metoprolol tartrate (LOPRESSOR) tablet 25 mg  25 mg Oral  DAILY   ??? pravastatin (PRAVACHOL) tablet 80 mg  80 mg Oral DAILY   ??? acetaminophen (TYLENOL) tablet 650 mg  650 mg Oral Q6H PRN    Or   ??? acetaminophen (TYLENOL) solution 650 mg  650 mg Oral Q6H PRN    Or   ??? acetaminophen (TYLENOL) suppository 650 mg  650 mg Rectal Q6H PRN   ??? ondansetron (ZOFRAN) injection 4 mg  4 mg IntraVENous Q8H PRN   ??? pantoprazole (PROTONIX) 40 mg in 0.9% sodium chloride 10 mL injection  40 mg IntraVENous DAILY         Intake and Output:  Current Shift:  No intake/output data recorded.  Last three shifts:  02/23 1901 - 02/25 0700  In: 220 [P.O.:120; I.V.:100]  Out: 525 [Urine:525]    Lab/Data Reviewed:  All lab and imaging  results for the last 24 hours reviewed.    Kristie Cowman, MD  P# [phone]  January 09, 2021    [facility] medical dictation software was used for portions of this report.  Unintended voice transcription errors may have occurred.

## 2021-01-09 NOTE — Progress Notes (Signed)
Problem: Pressure Injury - Risk of  Goal: *Prevention of pressure injury  Description: Document Braden Scale and appropriate interventions in the flowsheet.  Outcome: Progressing Towards Goal  Note: Pressure Injury Interventions:  Sensory Interventions: Assess changes in LOC,Keep linens dry and wrinkle-free,Minimize linen layers,Pressure redistribution bed/mattress (bed type)    Moisture Interventions: Absorbent underpads,Apply protective barrier, creams and emollients,Minimize layers    Activity Interventions: Increase time out of bed,Pressure redistribution bed/mattress(bed type),PT/OT evaluation    Mobility Interventions: HOB 30 degrees or less,Pressure redistribution bed/mattress (bed type),PT/OT evaluation    Nutrition Interventions: Document food/fluid/supplement intake,Offer support with meals,snacks and hydration    Friction and Shear Interventions: Apply protective barrier, creams and emollients,HOB 30 degrees or less,Minimize layers

## 2021-01-09 NOTE — Progress Notes (Signed)
 OCCUPATIONAL THERAPY EVALUATION     Patient: Beverly Nguyen (86 y.o. female)  Room: 6620/6620  Primary Diagnosis: Subdural hematoma (HCC) [S06.5X9A]  Fall [W19.XXXA]  Dementia (HCC) [F03.90]   Procedure(s) (LRB):  NEUROVASCULAR PROCEDURE (N/A) 2 Days Post-Op    Date: 01/09/2021  In Time:  1216        Out Time:  1240  Total Time: 9  Evaluation Time: 15  Treatment Time: 24    Isolation:  There are currently no Active Isolations       MDRO: No active infections  Precautions: falls, legally blind, dementia   Ordered weight bearing status: None      Orders, labs, and chart reviewed on Ronal Shams. Communicated with nursing staff. Patient cleared to participate.    ASSESSMENT:      Based on the objective data described below, the patient presents with     Left shoulder non displaced distal clavicle fracture:     While in bed she does not need anything to support the arm.  When she starts to mobilize would recommend a sling for comfort.  Would also not recommend any weightbearing through that arm.    - generalized muscle weakness affecting function in ADLs  - decreased functional sitting and standing balance    - decreased functional mobility   - unsteady in functional mobility, transfers, standing and sitting   - decreased cognition   - decreased safety awareness  - visual limitations/legally blind per RN  affecting patient's ability to safely and independently perform basic ADLs    Patient will benefit from skilled occupational therapy intervention to address the above impairments.    Patient's rehabilitation potential is considered to be Good.     Modified Rankin Score (mRS): 4 - Severe disability; unable to walk without assistance and unable to attend to own bodily needs without assistance    PLAN : Patient and/or family have participated as able in goal setting and plan of care.  Planned Interventions: ADI training, activity tolerance, functional balance training, functional mobility training, therapeutic exercise,  therapeutic activity and patient/caregiver education and training.  Frequency/Duration: Patient to be seen 1-5x/week x 4 weeks.  Discharge Recommendations: SNF verses home with son/(24/7 assist) and HH OT , pending progress  Further Equipment Recommendations for Discharge: DME needs to be determined at time of d/c from rehab     Patient was admitted to the hospital on 01/04/2021 with   Chief Complaint   Patient presents with   . Fall   . Head Injury     Present illness history:   Patient Active Problem List    Diagnosis Date Noted   . Fall 01/04/2021   . Dementia (HCC) 01/04/2021   . Subdural hematoma (HCC) 12/15/2020      Previous medical history:   Past Medical History:   Diagnosis Date   . Arthritis    . Dementia (HCC)    . High cholesterol    . Hypertension    . S/P hip replacement    . Thyroid disorder        Most recent value for oxygen in flow sheets:  O2 Device: None (Room air) (01/09/21 0834)        PRIOR LEVEL OF FUNCTION:   Pt unreliable historian  Per chart:   Prior Level of Function/Home Situation:   Home environment: Lives with Son, 1 story.  Prior level of function: ADLs with assist  In bed most of day  Prior level of Activities of Daily  Living: unknown   Home equipment: unknown    EDUCATION:     Barriers to learning/limitations: Yes;  decreased cognition, baseline dementia, visual limitations  Education provided to: patient on (+) role of OT, (+) OT plan of care, (+) NWB LUE, benefits of OOB activity  Educational handouts issued: none this session  Patient / family response to education: trying to perform skills    SUBJECTIVE:   Patient I can't see it.  --------------------------------------------------------------------------------------------------  Pain Assessment: 3/10  Pain Location:  head    OBJECTIVE DATA SUMMARY:     Patient found Bedside chair, char alarm, Telemetry and scooting down in reclined chair stating, my tail bone hurts.    Cognition     Mental Status: Oriented to, person and  place (partial--hospital, but stated she was in [place] ).  Communication: grossly intact.  Attention span: poor(0-23min).  Follows commands: 1 step.  General Cognition: impaired sequencing, impaired safety and impaired problem solving.  Hearing: grossly intact.  Vision:  legally blind. Difficult to fully assess due to baseline dementia.      Activities of Daily Living  Based on direct observation, simulation and clinical assessment.  (clincial judgement based on: activity tolerance, balance, safety awareness, cognition, functional strength/ROM/coordination of all extremities)    Eating: min. assist.  Grooming: min. assist.  UB Bathing: mod. assist.  LB Bathing: mod. assist.  UB Dressing: mod. assist.  LB Dressing: mod. assist  Toileting: min. assist.    ADL Comment:  ADL performance limited due to c/o LUE pain when touched, visual limitations, and baseline dementia.   ADL training included toileting to improve functional mobility for ADLs.     Mobility    Rolling: no tested .  Supine to sit: not tested.  Sit to Supine: min. assist.  Sit to Stand: min. assist.  Functional transfers: chair to bedside commode with  min. assist.    Mobility Comment: Pt requires Min (A) with hand-held-assist RUE and verbal cues to keep the LUE close to the body/not bear weight (awaiting sling).  Therapeutic activity included functional transfer, bed mobility and functional bed mobility, transfers and ambulation training to improve functional mobility for ADLs.      Functional Balance    Static Sitting Balance: good-.  Dynamic Sitting Balance: fair+.  Static Standing Balance: fair.  Dynamic Standing Balance: fair-.  Activity Tolerance: good-.    Upper Extremity Function    Right ROM/strength:  AROM, WFL and 3+/5.  Left ROM/strength: DNT    Final Location: bed, bed alarm, all needs close and agrees to call for assistance.    Occupational Therapy Goals:   OT goals initiated 01/09/2021 and will be met by patient within 10 sessions      Patient will complete UB ADL's with SBA.  Patient will complete LB ADL's with SBA.  Patient will complete all aspects of toileting with SBA/CGA due to limited vision and in unfamiliar environment.  Patient will complete functional mobility using SPC with SBA.   Patient will complete standing grooming while standing at the sink for 5 min with SBA due to decreased vision.  _______________________________________________________________________    Thank you for this referral.    Vernell LITTIE Horns, OT  January 09, 2021

## 2021-01-09 NOTE — Progress Notes (Signed)
 PHYSICAL THERAPY EVALUATION    Patient: Beverly Nguyen (86 y.o. female)  Room: 6620/6620    Date: 01/09/2021  Start Time:  0851  End Time:  0930    Primary Diagnosis: Subdural hematoma (HCC) [S06.5X9A]  Fall [W19.XXXA]  Dementia (HCC) [F03.90]  Procedure(s) (LRB):  NEUROVASCULAR PROCEDURE (N/A) 2 Days Post-Op     Precautions: Falls..  Weight bearing precautions: NWB and LUE    Orders reviewed, chart reviewed, and initial evaluation completed on Ronal Shams. Spoke with RN, cleared for PT     ASSESSMENT :  Based on the objective data described below, the patient presents with Decreased Strength  Decreased Transfer Abilities  Decreased Ambulation Ability/Technique  Decreased Balance  Decreased Activity Tolerance  Decreased Pacing Skills  Increased Fatigue  Decreased Cognition.  Pt with flat affect. Appears with visual deficits. Pt did not visually track. Pt unable to accurately identify objects. Reports  looks like a blob Due to dementia unable to ascertain if this is new . Unable to find documentation to support impairment. Discussed with RN to make sure this is not a change in status. RN unaware, but after speaking to charge RN, notified pt is legally blind  Applied tele lead on call bell to have tactile indicator. Discussed with CP to assist pt with breakfast.  Sling not in room. Discussed with RN for ordering. Pt able to maintain NWB on Left UE with continual verbal/manual cues. May be difficult for pt to maintain due to dementia. Pt ambulated 4 ft bed to chair with mod HHA. Unsteady.   Modified Rankin Score (mRS): 4 - Severe disability; unable to walk without assistance and unable to attend to own bodily needs without assistance  Patient will benefit from skilled intervention to address the above impairments.  Patient's rehabilitation potential is considered to be Good            PLAN :  Planned Interventions:  Functional mobility training Nutritional therapist Therapeutic exercises  Therapeutic activities Neuro muscular re-education AD training Patient/caregiver education    Frequency/Duration: Patient will be followed by physical therapy 3x / Week and 5x / Week to address goals.    Recommendations:  Physical Therapy and Occupational Therapy  Discharge Recommendations: SNF  Further Equipment Recommendations for Discharge: SB quad cane Hemiwalker or str cane  TBD    GOALS:  - Patient will be independent with bed mobility.  - Patient will be contact guard with transfers in preparation for OOB activities and ambulation.  - Patient will ambulate with minimal assistance/contact guard assist for 75 feet with the least restrictive device to promote functional independence.   - Patient will ascend/descend 4 stairs with handrail(s) with moderate assistance .  - Patient will demonstrate good balance.  - Patient will demonstrate good activity tolerance during functional activities.  - Patient will demonstrate good safety awareness during functional activities.  - Patient will be contact guard with lower extremity home exercise program to increase strength and endurance.  - Patient will state/observe weight bearing precautions.  - Patient will utilize energy conservation techniques during functional activities.      SUBJECTIVE:   Patient: agreeable to PT sometimes I wear glasses  Denies pain at present.     OBJECTIVE DATA SUMMARY:   Present illness history:   Problem List  Never Reviewed          Codes Class Noted    Fall ICD-10-CM: W19.CHERENE  ICD-9-CM: Z111.0  01/04/2021  Dementia [facility]) ICD-10-CM: F03.90  ICD-9-CM: 294.20  01/04/2021        * (Principal) Subdural hematoma [facility]) ICD-10-CM: D93.4K0J  ICD-9-CM: 432.1  12/15/2020             Past Medical history:   Past Medical History:   Diagnosis Date   . Arthritis    . Dementia (HCC)    . High cholesterol    . Hypertension    . S/P hip replacement    . Thyroid disorder      Pt unreliable historian  Per chart:   Prior Level of Function/Home Situation:    Home environment: Lives with Son, 1 story.  Prior level of function: ADLs with assist  In bed most of day  Prior level of Activities of Daily Living: unknown   Home equipment: unknown      Patient found: Bed, Telemetry and Bed alarm.    Pain Assessment before PT session: None observed and None reported  Pain Location:  L orbital region, shoulder   Pain Assessment after PT session: None observed and None reported  Pain Location:    []           Yes, patient had pain medications  []           No, Patient has not had pain medications  []           Nurse notified    COGNITIVE STATUS:     Mental Status: Oriented to, person, place, alert, awake, eyes open and confused.  Communication: grossly intact and limited verbalization.  Attention span: fair(15-7min).  Follows commands: impaired and inconsistent.  General Cognition: patient presents with, slow processing, delayed responses, impaired problem solving, limited eye tracking and limited head tracking.  Hearing: grossly intact.  Vision:  impaired and legally blind.per charge RN      EXTREMITIES ASSESSMENT:      Tone & Sensation:   Normal  0 =No increase in muscle tone    Strength:    R UE grossly 4-to 4/5 LEs grossly 4/5   Range Of Motion:  WFL on unaffected exts.     Functional mobility and balance status:     Supine to sit -  mod. assist  Sit to Stand -  mod. assist  Stand to Sit -  mod. assist        Balance:   Static sitting balance: good  Dynamic sitting balance: good  Static standing balance: fair  Dynamic standing balance: fair  Single leg stance:     Ambulation/Gait Training:    Distance  4 feet   Analysis  unsteady, decreased cadence, decreased step height/length and rest break(s) required,     Assistance  mod assist and cueing to promote proper body posture and promote proper body mechanics    DME  gait belt           Therapeutic Exercises:    Patient received/participated in 20 minutes of treatment (therapeutic exercises/activities) immediately following  evaluation and/or educational instruction during/immediately following PT evaluation.    Lower Extremities:  Heel slides,   Upper Extremities:    mobility focused on bed mobility, transfers, ambulation     Balance Activities: Standing    Activity Tolerance:   fair- and requires rest breaks    Final Location:   bedside chair, chair alarm, all needs close, agrees to call for assistance and nurse notified     COMMUNICATION/EDUCATION:   Education: Patient, Benefit of activity while hospitalized, Call for assistance, Staff assistance  with mobility, Changes positions frequently, Safety, Functional mobility, Reinforce needed and Role of PT  Barriers to Learning/Limitations: yes;  cognitive and sensory deficits-vision/hearing/speech    Please refer to care plan and patient education section for further details.    Thank you for this referral.  Janeen Brady, PT

## 2021-01-10 LAB — GLUCOSE, POC
Glucose (POC): 102 mg/dL (ref 65–105)
Glucose (POC): 120 mg/dL — ABNORMAL HIGH (ref 65–105)

## 2021-01-10 LAB — POCT GLUCOSE
POC Glucose: 102 mg/dL (ref 65–105)
POC Glucose: 120 mg/dL — ABNORMAL HIGH (ref 65–105)

## 2021-01-10 MED FILL — PANTOPRAZOLE 40 MG IV SOLR: 40 mg | INTRAVENOUS | Qty: 40

## 2021-01-10 MED FILL — LEVETIRACETAM IN SOD CHLORIDE (ISO-OSM) 500 MG/100 ML IV PIGGY BACK: 500 mg/100 mL | INTRAVENOUS | Qty: 100

## 2021-01-10 MED FILL — DONEPEZIL 10 MG TAB: 10 mg | ORAL | Qty: 1

## 2021-01-10 MED FILL — AMLODIPINE 5 MG TAB: 5 mg | ORAL | Qty: 1

## 2021-01-10 MED FILL — PRAVASTATIN 40 MG TAB: 40 mg | ORAL | Qty: 2

## 2021-01-10 MED FILL — LEVOTHYROXINE 25 MCG TAB: 25 mcg | ORAL | Qty: 1

## 2021-01-10 MED FILL — CITALOPRAM 20 MG TAB: 20 mg | ORAL | Qty: 1

## 2021-01-10 MED FILL — METOPROLOL TARTRATE 25 MG TAB: 25 mg | ORAL | Qty: 1

## 2021-01-10 NOTE — Progress Notes (Signed)
Medicine Progress Note    Patient: Beverly Nguyen   Age:  86 y.o.  DOA: 01/04/2021     LOS:  LOS: 6 days     Assessment/Plan      Left subdural hematoma with 5.8 mm midline shift  Left frontal scalp hematoma  Left shoulder nondisplaced distal clavicle fracture  Hypertension  Hyperlipidemia  Hypothyroidism    -Status post cerebral angiogram with Dr. Excell Seltzer 2/23, no intervention or embolization performed  -Continue Keppra  -Orthopedics is seen and recommends nonoperative management for distal clavicle fracture, follow-up with Dr. Delford Field as outpatient in 2 to 3 weeks  -PT OT eval, SNF on discharge    Discussed with daughter at bedside    Subjective:   Patient seen and examined. No complaints, No N/V, F/C, CP or SOB     Objective:     Visit Vitals  BP (!) 144/63 (BP 1 Location: Right arm, BP Patient Position: Supine)   Pulse (!) 54   Temp 98.1 ??F (36.7 ??C)   Resp 20   Wt 59.3 kg (130 lb 11.7 oz)   SpO2 100%   BMI 24.70 kg/m??       Physical Exam:  General appearance: alert, cooperative, no distress, appears stated age  Head: Left forehead hematoma lungs: clear to auscultation bilaterally  Heart: regular rate and rhythm, S1, S2 normal, no murmur, click, rub or gallop  Abdomen: soft, non-tender. Bowel sounds normal. No masses,  no organomegaly  Extremities: extremities normal, atraumatic, no cyanosis or edema  Skin: Skin color, texture, turgor normal. No rashes or lesions in exposed areas   Neurologic: Grossly normal        Medications Reviewed:  Current Facility-Administered Medications   Medication Dose Route Frequency   ??? levothyroxine (SYNTHROID) tablet 25 mcg  25 mcg Oral 6am   ??? amLODIPine (NORVASC) tablet 5 mg  5 mg Oral DAILY   ??? levETIRAcetam (KEPPRA) 500 mg in saline (iso-osm) 100 mL IVPB (premix)  500 mg IntraVENous Q12H   ??? [Held by provider] aspirin chewable tablet 81 mg  81 mg Oral DAILY   ??? citalopram (CELEXA) tablet 20 mg  20 mg Oral DAILY   ??? donepeziL (ARICEPT) tablet 10 mg  10 mg Oral QHS   ??? metoprolol  tartrate (LOPRESSOR) tablet 25 mg  25 mg Oral DAILY   ??? pravastatin (PRAVACHOL) tablet 80 mg  80 mg Oral DAILY   ??? acetaminophen (TYLENOL) tablet 650 mg  650 mg Oral Q6H PRN    Or   ??? acetaminophen (TYLENOL) solution 650 mg  650 mg Oral Q6H PRN    Or   ??? acetaminophen (TYLENOL) suppository 650 mg  650 mg Rectal Q6H PRN   ??? ondansetron (ZOFRAN) injection 4 mg  4 mg IntraVENous Q8H PRN   ??? pantoprazole (PROTONIX) 40 mg in 0.9% sodium chloride 10 mL injection  40 mg IntraVENous DAILY         Intake and Output:  Current Shift:  No intake/output data recorded.  Last three shifts:  No intake/output data recorded.    Lab/Data Reviewed:  All lab and imaging  results for the last 24 hours reviewed.    Kristie Cowman, MD  P# [phone]  January 10, 2021    [facility] medical dictation software was used for portions of this report.  Unintended voice transcription errors may have occurred.

## 2021-01-10 NOTE — Progress Notes (Signed)
Orthopaedics Daily Progress Note    Patient without new complaints. No new changes. Pain controlled.     Patient Vitals for the past 24 hrs:   BP Temp Pulse Resp SpO2   01/10/21 0805 (!) 167/65 97.2 ??F (36.2 ??C) 60 16 100 %   01/10/21 0419 (!) 173/57 97.7 ??F (36.5 ??C) (!) 57 18 100 %   01/09/21 2342 (!) 167/58 97.5 ??F (36.4 ??C) 66 18 100 %   01/09/21 2001 (!) 144/57 98.6 ??F (37 ??C) (!) 57 18 100 %   01/09/21 1427 (!) 117/48 97.8 ??F (36.6 ??C) 62 18 99 %   01/09/21 1149 (!) 110/42 97.7 ??F (36.5 ??C) (!) 52 18 100 %     Physical exam:  Left upper extremity: TTP over the distal clavicle.  No erythema, no edema.    NVI left upper extremity.   2+ radial pulse, motor 5/5 in hand and wrist.         Assessment:  Left distal clavicle fracture nondisplaced.     PLAN:  Ok to mobilize her. Recommend sling for comfort and minimal weight bearing to the left upper extremity.   Stable for discharge to home/snf From orthopedic standpoint.   F/u with me in 2-3 weeks for re-eval of the shoulder and x-rays.   Appreciate medical management of hospitalist.    Caryl Ada, MD  01/10/2021  9:56 AM

## 2021-01-11 LAB — GLUCOSE, POC
Glucose (POC): 106 mg/dL — ABNORMAL HIGH (ref 65–105)
Glucose (POC): 106 mg/dL — ABNORMAL HIGH (ref 65–105)
Glucose (POC): 127 mg/dL — ABNORMAL HIGH (ref 65–105)
Glucose (POC): 123 mg/dL — ABNORMAL HIGH (ref 65–105)

## 2021-01-11 LAB — POCT GLUCOSE
POC Glucose: 106 mg/dL — ABNORMAL HIGH (ref 65–105)
POC Glucose: 106 mg/dL — ABNORMAL HIGH (ref 65–105)
POC Glucose: 127 mg/dL — ABNORMAL HIGH (ref 65–105)
POC Glucose: 123 mg/dL — ABNORMAL HIGH (ref 65–105)

## 2021-01-11 MED FILL — LEVETIRACETAM IN SOD CHLORIDE (ISO-OSM) 500 MG/100 ML IV PIGGY BACK: 500 mg/100 mL | INTRAVENOUS | Qty: 100

## 2021-01-11 MED FILL — METOPROLOL TARTRATE 25 MG TAB: 25 mg | ORAL | Qty: 1

## 2021-01-11 MED FILL — LEVOTHYROXINE 25 MCG TAB: 25 mcg | ORAL | Qty: 1

## 2021-01-11 MED FILL — PRAVASTATIN 40 MG TAB: 40 mg | ORAL | Qty: 2

## 2021-01-11 MED FILL — CITALOPRAM 20 MG TAB: 20 mg | ORAL | Qty: 1

## 2021-01-11 MED FILL — DONEPEZIL 10 MG TAB: 10 mg | ORAL | Qty: 1

## 2021-01-11 MED FILL — PANTOPRAZOLE 40 MG IV SOLR: 40 mg | INTRAVENOUS | Qty: 40

## 2021-01-11 MED FILL — AMLODIPINE 5 MG TAB: 5 mg | ORAL | Qty: 1

## 2021-01-11 NOTE — Progress Notes (Signed)
Medicine Progress Note    Patient: Beverly Nguyen   Age:  86 y.o.  DOA: 01/04/2021     LOS:  LOS: 7 days     Assessment/Plan      Left subdural hematoma with 5.8 mm midline shift  Left frontal scalp hematoma  Left shoulder nondisplaced distal clavicle fracture  Hypertension  Hyperlipidemia  Hypothyroidism    -Neurosurgery signed off, follow-up with Dr. Su Ley as outpatient in a couple weeks  -Status post cerebral angiogram with Dr. Excell Seltzer 2/23, no intervention or embolization performed  -Continue Keppra seizure PPx  -Orthopedics has seen and recommends nonoperative management for distal clavicle fracture, follow-up with Dr. Delford Field as outpatient in 2 to 3 weeks  -PT OT eval, SNF on discharge, case management working on options      Subjective:   Patient seen and examined. No complaints, No N/V, F/C, CP or SOB     Objective:     Visit Vitals  BP 96/75 (BP 1 Location: Left leg, BP Patient Position: Supine)   Pulse 61   Temp 97.7 ??F (36.5 ??C)   Resp 16   Wt 58.2 kg (128 lb 4.9 oz)   SpO2 99%   BMI 24.24 kg/m??       Physical Exam:  General appearance: alert, cooperative, no distress, appears stated age  Head: Left forehead hematoma lungs: clear to auscultation bilaterally  Heart: regular rate and rhythm, S1, S2 normal, no murmur, click, rub or gallop  Abdomen: soft, non-tender. Bowel sounds normal. No masses,  no organomegaly  Extremities: extremities normal, atraumatic, no cyanosis or edema  Skin: Skin color, texture, turgor normal. No rashes or lesions in exposed areas   Neurologic: Grossly normal        Medications Reviewed:  Current Facility-Administered Medications   Medication Dose Route Frequency   ??? levothyroxine (SYNTHROID) tablet 25 mcg  25 mcg Oral 6am   ??? amLODIPine (NORVASC) tablet 5 mg  5 mg Oral DAILY   ??? levETIRAcetam (KEPPRA) 500 mg in saline (iso-osm) 100 mL IVPB (premix)  500 mg IntraVENous Q12H   ??? [Held by provider] aspirin chewable tablet 81 mg  81 mg Oral DAILY   ??? citalopram (CELEXA) tablet 20 mg  20  mg Oral DAILY   ??? donepeziL (ARICEPT) tablet 10 mg  10 mg Oral QHS   ??? metoprolol tartrate (LOPRESSOR) tablet 25 mg  25 mg Oral DAILY   ??? pravastatin (PRAVACHOL) tablet 80 mg  80 mg Oral DAILY   ??? acetaminophen (TYLENOL) tablet 650 mg  650 mg Oral Q6H PRN    Or   ??? acetaminophen (TYLENOL) solution 650 mg  650 mg Oral Q6H PRN    Or   ??? acetaminophen (TYLENOL) suppository 650 mg  650 mg Rectal Q6H PRN   ??? ondansetron (ZOFRAN) injection 4 mg  4 mg IntraVENous Q8H PRN   ??? pantoprazole (PROTONIX) 40 mg in 0.9% sodium chloride 10 mL injection  40 mg IntraVENous DAILY         Intake and Output:  Current Shift:  02/27 0701 - 02/27 1900  In: 220 [P.O.:220]  Out: 400 [Urine:400]  Last three shifts:  No intake/output data recorded.    Lab/Data Reviewed:  All lab and imaging  results for the last 24 hours reviewed.    Kristie Cowman, MD  P# [phone]  January 11, 2021    [facility] medical dictation software was used for portions of this report.  Unintended voice transcription errors may have occurred.

## 2021-01-12 LAB — GLUCOSE, POC
Glucose (POC): 124 mg/dL — ABNORMAL HIGH (ref 65–105)
Glucose (POC): 131 mg/dL — ABNORMAL HIGH (ref 65–105)
Glucose (POC): 132 mg/dL — ABNORMAL HIGH (ref 65–105)
Glucose (POC): 155 mg/dL — ABNORMAL HIGH (ref 65–105)

## 2021-01-12 LAB — POCT GLUCOSE
POC Glucose: 124 mg/dL — ABNORMAL HIGH (ref 65–105)
POC Glucose: 131 mg/dL — ABNORMAL HIGH (ref 65–105)
POC Glucose: 132 mg/dL — ABNORMAL HIGH (ref 65–105)
POC Glucose: 155 mg/dL — ABNORMAL HIGH (ref 65–105)

## 2021-01-12 MED FILL — LEVETIRACETAM IN SOD CHLORIDE (ISO-OSM) 500 MG/100 ML IV PIGGY BACK: 500 mg/100 mL | INTRAVENOUS | Qty: 100

## 2021-01-12 MED FILL — AMLODIPINE 5 MG TAB: 5 mg | ORAL | Qty: 1

## 2021-01-12 MED FILL — METOPROLOL TARTRATE 25 MG TAB: 25 mg | ORAL | Qty: 1

## 2021-01-12 MED FILL — PANTOPRAZOLE 40 MG IV SOLR: 40 mg | INTRAVENOUS | Qty: 40

## 2021-01-12 MED FILL — LEVOTHYROXINE 25 MCG TAB: 25 mcg | ORAL | Qty: 1

## 2021-01-12 MED FILL — CITALOPRAM 20 MG TAB: 20 mg | ORAL | Qty: 1

## 2021-01-12 MED FILL — PRAVASTATIN 40 MG TAB: 40 mg | ORAL | Qty: 2

## 2021-01-12 MED FILL — DONEPEZIL 10 MG TAB: 10 mg | ORAL | Qty: 1

## 2021-01-12 NOTE — Progress Notes (Signed)
No report received from off going nurse. Assumed care of patient  Patient alert. Hematoma observed to left face. Patient offers no concerns and states she can verbalized needs. Patient also says she is blind in left eye. Patient denies pain, breathing appears even and unlabored. Assessment to follow. Bed in lowest locked position. White board updated. Call bell within reach. Will continue to monitor.

## 2021-01-12 NOTE — Progress Notes (Signed)
Problem: Pressure Injury - Risk of  Goal: *Prevention of pressure injury  Description: Document Braden Scale and appropriate interventions in the flowsheet.  Outcome: Progressing Towards Goal  Note: Pressure Injury Interventions:  Sensory Interventions: Assess changes in LOC,Keep linens dry and wrinkle-free,Minimize linen layers    Moisture Interventions: Absorbent underpads,Internal/External urinary devices,Minimize layers    Activity Interventions: Pressure redistribution bed/mattress(bed type)    Mobility Interventions: Pressure redistribution bed/mattress (bed type)    Nutrition Interventions: Document food/fluid/supplement intake    Friction and Shear Interventions: Minimize layers                Problem: Patient Education: Go to Patient Education Activity  Goal: Patient/Family Education  Outcome: Progressing Towards Goal     Problem: Falls - Risk of  Goal: *Absence of Falls  Description: Document Schmid Fall Risk and appropriate interventions in the flowsheet.  Outcome: Progressing Towards Goal  Note: Fall Risk Interventions:  Mobility Interventions: Bed/chair exit alarm,Patient to call before getting OOB,Utilize walker, cane, or other assistive device    Mentation Interventions: Adequate sleep, hydration, pain control,Bed/chair exit alarm    Medication Interventions: Bed/chair exit alarm,Patient to call before getting OOB,Teach patient to arise slowly    Elimination Interventions: Bed/chair exit alarm,Call light in reach,Patient to call for help with toileting needs    History of Falls Interventions: Bed/chair exit alarm         Problem: Patient Education: Go to Patient Education Activity  Goal: Patient/Family Education  Outcome: Progressing Towards Goal

## 2021-01-12 NOTE — Progress Notes (Signed)
Progress  Notes by Faythe Ghee, DO at 01/12/21 1503                Author: Faythe Ghee, DO  Service: Hospitalist  Author Type: Physician       Filed: 01/12/21 1530  Date of Service: 01/12/21 1503  Status: Signed          Editor: Faythe Ghee, DO (Physician)                                                                                                                               Assessment/Plan:        Patient seen for complicated medical problems as listed below :      Left subdural hematoma with 5.8 mm midline shift   Left frontal scalp hematoma   Left shoulder nondisplaced distal clavicle fracture   Hypertension   Hyperlipidemia   Hypothyroidism        Plan        -Neurosurgery signed off, follow-up with Dr. Su Ley as outpatient in a couple weeks   -Status post cerebral angiogram with Dr. Excell Seltzer 2/23, no intervention or embolization performed   -Continue Keppra seizure PPx   -Orthopedics has seen and recommends nonoperative management for distal clavicle fracture, follow-up with Dr. Delford Field as outpatient in 2 to 3 weeks   -PT OT eval and treat.   ??   Discussed with patient. Updates regarding diagnose, tests results, prognosis, treatment  of the patient's medical conditions discussed in detail. All questions answered to the satisfaction  of those individual(s) who also verbalized understanding of and agreement with the assessment and plan.    Date of anticipated Discharge: Waiting for rehab placement.      EXAM:   GENERAL: in mild distress.    RESPIRATORY: No use of accessory muscles of respiration. Bilateral BS present, decreased at bases. No rales or rhonchi.    CARDIOVASCULAR: S1 and S2 present, regular. No murmur, rub, or thrill.    ABDOMEN: Soft and nontender with positive bowel sounds. No organomegaly.    EXTREMITIES: No edema. No calf tenderness.    NEUROLOGICAL: Cranial nerves II through XII grossly intact. No focal weakness.    SKIN: No rash. Skin is warm, dry, and intact.         Subjective:         Patient no acute event overnight           Vitals:             01/12/21 0002  01/12/21 0332  01/12/21 0812  01/12/21 1312           BP:  (!) 149/55  (!) 152/55  (!) 130/56  (!) 123/50     Pulse:  71  70  73  62     Resp:  18  16  20  19      Temp:  97.2 ??F (36.2 ??C)  97.7 ??F (36.5 ??C)  97.9 ??F (36.6 ??C)  97.9 ??F (36.6 ??C)     SpO2:  100%  99%  98%  93%           Weight:                   No results for input(s): WBC, HGB, HCT, MCV, PLT, HGBEXT, HCTEXT, PLTEXT in the last 72 hours.   No results for input(s): NA, K, CL, CO2, AGAP, CA, BUN, CREA, GLU, INR, INREXT in the last 72 hours.   No results for input(s): ALB, TP, AP, TBILI, CBIL, ALT, AGRAT, GLOB in the last 72 hours.      No lab exists for component: SGOT   No results for input(s): GLUCU, BILU, KETU, SPGRU, BLDU, PHU, PROTU, UROU, NITU, LEUKU in the last 72 hours.      No lab exists for component: SOURCEUR      Recent Labs                  01/12/21   1314  01/12/21   0818  01/11/21   2054  01/11/21   1756  01/11/21   1231  01/11/21   0845  01/10/21   2123  01/10/21   1708               GLUCPOC  155*  124*  131*  123*  127*  106*  106*  120*             Total of 30 minutes of which more than 50% was spent in coordination of care and counseling (time spent with patient/family face to face, physical exam, reviewing laboratory and imaging investigations, speaking with physicians and nursing staff involved  in this patient's care).       [employer] medical dictation software was used for portions of this report. Unintended errors may occur.       Faythe Ghee D.O.   [facility]

## 2021-01-12 NOTE — Progress Notes (Signed)
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

## 2021-01-12 NOTE — Progress Notes (Signed)
Addendum: POA is granddaughter [place]. Tammy Sours is son and per daughter is under the influence most of the day. POA wants [facility] where a niece works.     Discharge planning:    PT rec SNF. Referral sent to [facility] SNF for review per family wishes.

## 2021-01-13 LAB — GLUCOSE, POC: Glucose (POC): 113 mg/dL — ABNORMAL HIGH (ref 65–105)

## 2021-01-13 LAB — COVID-19 (INPATIENT TESTING): COVID-19: NEGATIVE

## 2021-01-13 LAB — COVID-19: COVID-19: NEGATIVE

## 2021-01-13 LAB — POCT GLUCOSE: POC Glucose: 113 mg/dL — ABNORMAL HIGH (ref 65–105)

## 2021-01-13 MED ORDER — LEVETIRACETAM 500 MG TAB
500 mg | ORAL_TABLET | Freq: Two times a day (BID) | ORAL | 0 refills | Status: AC
Start: 2021-01-13 — End: 2021-02-12

## 2021-01-13 MED ORDER — AMLODIPINE 5 MG TAB
5 mg | ORAL_TABLET | Freq: Every day | ORAL | 0 refills | Status: AC
Start: 2021-01-13 — End: 2021-02-13

## 2021-01-13 MED ORDER — PANTOPRAZOLE 40 MG TAB, DELAYED RELEASE
40 mg | ORAL_TABLET | Freq: Every day | ORAL | 0 refills | Status: AC
Start: 2021-01-13 — End: 2021-02-13

## 2021-01-13 MED ORDER — PANTOPRAZOLE 40 MG TAB, DELAYED RELEASE
40 mg | Freq: Every day | ORAL | Status: DC
Start: 2021-01-13 — End: 2021-01-13

## 2021-01-13 MED FILL — PANTOPRAZOLE 40 MG IV SOLR: 40 mg | INTRAVENOUS | Qty: 40

## 2021-01-13 MED FILL — DONEPEZIL 10 MG TAB: 10 mg | ORAL | Qty: 1

## 2021-01-13 MED FILL — LEVETIRACETAM IN SOD CHLORIDE (ISO-OSM) 500 MG/100 ML IV PIGGY BACK: 500 mg/100 mL | INTRAVENOUS | Qty: 100

## 2021-01-13 MED FILL — LEVOTHYROXINE 25 MCG TAB: 25 mcg | ORAL | Qty: 1

## 2021-01-13 NOTE — Progress Notes (Signed)
Patients son left several voicemail on CM phone today (post discharge). He was upset that patient went to [facility]. Informed of POA paperwork for daughter on behalf of patient. Requested that he speak with her.

## 2021-01-13 NOTE — Progress Notes (Signed)
Report called to [employer], LPN with [facility]. Patient awaithing transport via [facility] at 1700. Will continue to monitor. Call bell within reach.

## 2021-01-13 NOTE — Progress Notes (Signed)
Transport to: [facility]  Reason for transport:Dementia, frequent falls with multiple hematomas to head and clavicle fracture, decreased cognition, unable to ambulate  Transport set up with:[facility]  Time/Date: 01/13/21@1700   D/C Summary loaded: yes  Nurse/CM notified: yes  Envelope delivered: yes  Insurance verified on face sheet: yes  Auth needed: no  Auth #:

## 2021-01-13 NOTE — Progress Notes (Signed)
Discharge Plan:   SNF     Discharge Date:     01/13/2021     Is patient going to snf? Berdine Dance - RN call report to [phone]      The facility confirmed that they are ready to receive the patient:     Yes; the CM spoke with Hansel Starling given: yes    Transportation: Medical       Transport Time: 3pm     Family, MD, patient, nurse and facility aware of pickup time and dc plan

## 2021-01-13 NOTE — Discharge Summary (Signed)
Discharge  Summary by Faythe Ghee, DO at 01/13/21 1214                Author: Faythe Ghee, DO  Service: Hospitalist  Author Type: Physician       Filed: 01/13/21 1215  Date of Service: 01/13/21 1214  Status: Signed          Editor: Faythe Ghee, DO (Physician)                                                                                      Discharge Summary        Patient ID:   Beverly Nguyen, 86 y.o.,  female   DOB: 13-Aug-1935      Admit Date: 01/04/2021   Discharge Date:  01/13/2021   Length of stay: 9 day(s)      PCP:  None        Chief Complaint       Patient presents with        ?  Fall        ?  Head Injury              Discharge Diagnosis:       Left subdural hematoma with 5.8 mm midline shift   Left frontal scalp hematoma   Left shoulder nondisplaced distal clavicle fracture   Hypertension   Hyperlipidemia   Hypothyroidism      Hospital Course:        -Neurosurgery signed off, follow-up with Dr.??Koen??as outpatient in??a couple weeks   -Status post cerebral angiogram with Dr. Excell Seltzer 2/23, no intervention or embolization performed   -Continue Keppra??seizure PPx   -Orthopedics??has??seen and recommends nonoperative management for distal clavicle fracture, follow-up with Dr. Delford Field as outpatient in 2 to 3 weeks      CONCERNS WHICH NEED CLOSE FOLLOW-UP:   follow-up with Dr.??Koen??as outpatient in 3 weeks    follow-up with Dr. Delford Field as outpatient in 2 to 3 weeks        Follow-up Information               Follow up With  Specialties  Details  Why  Contact Info              [facility])  Rehabilitation      [address] [place] [address]   [phone]              None        None [phone]) Patient stated that they have no PCP                      HPI on Admission (per admitting physician):   Beverly Nguyen is a 86 y.o. WHITE/NON-HISPANIC female who presents with for evaluation of head injury after a fall   Patient suffered large hematoma on the left side of the forehead, never lost consciousness    Patient is not on any anticoagulation, has been having falls this with the second fall today   When patient fell down patient also suffered a tear on the left arm      Operative Procedures:  Procedure(s) with comments:   NEUROVASCULAR PROCEDURE - Left meningeal artery embolization   Dr. Su LeyKoen takes insurance         Treatment Team:    Treatment Team: Attending Provider: Faythe GheeHuang, Miquela Costabile, DO; Consulting Provider: Other, Phys, MD; Consulting Provider: Caryl AdaWright, Geoffrey, MD; Consulting Provider: Kristie CowmanMacGregor, Roy P, MD; Occupational Therapist:  Buddy Dutyhavez, Rachel L, OT; Physical Therapist: Case, Delorse Limberarrie A, DPT; Primary Nurse: Cyndi LennertJones, Tonya      Physical Exam on Discharge:   Visit Vitals      BP  (!) 157/65 (BP 1 Location: Right arm, BP Patient Position: Supine)     Pulse  69     Temp  98.2 ??F (36.8 ??C)     Resp  16     Wt  58.2 kg (128 lb 4.9 oz)     SpO2  98%        BMI  24.24 kg/m??        Gen - AAOx3, NAD   HEENT - NC/AT, PERRL, EOMI, mucous membranes moist   Neck - supple, no JVD   Cardiac - RRR, S1, S2, no murmurs   Chest/Lungs - clear to auscultation without wheezes or rhonchi   Abdomen - soft, nontender, nondistended, normal bowel sounds   Extremities - no C/C/E   Neuro - CN 2-12 intact. No motor or sensory deficit.   Skin - no rashes or lesions        Current Discharge Medication List              START taking these medications          Details        amLODIPine (NORVASC) 5 mg tablet  Take 1 Tablet by mouth daily for 30 days.   Qty: 30 Tablet, Refills:  0   Start date: 01/14/2021, End date:  02/13/2021               pantoprazole (PROTONIX) 40 mg tablet  Take 1 Tablet by mouth daily for 30 days.   Qty: 30 Tablet, Refills:  0   Start date: 01/14/2021, End date:  02/13/2021               levETIRAcetam (Keppra) 500 mg tablet  Take 1 Tablet by mouth two (2) times a day for 30 days.   Qty: 60 Tablet, Refills:  0   Start date: 01/13/2021, End date:  02/12/2021                     CONTINUE these medications which have NOT CHANGED           Details        aspirin 81 mg chewable tablet  Take 81 mg by mouth daily.               loperamide-simethicone (Imodium Multi-Symptom Relief) 2-125 mg tab  Take  by mouth.               metoprolol tartrate (LOPRESSOR) 50 mg tablet  Take 25 mg by mouth daily.               ibandronate (Boniva) 150 mg tablet  Take 150 mg by mouth every thirty (30) days.               citalopram (CELEXA) 20 mg tablet  Take 20 mg by mouth daily.               megestroL (MEGACE) 400 mg/10 mL (40 mg/mL) suspension  Take 200 mg by mouth daily.               donepeziL (Aricept) 10 mg tablet  Take 10 mg by mouth nightly.               levothyroxine (synthroid) 25 mcg tablet  Take 25 mcg by mouth Daily (before breakfast).               memantine (Namenda) 10 mg tablet  Take 10 mg by mouth two (2) times a day.               pravastatin (PravachoL) 80 mg tablet  Take 80 mg by mouth daily.                         Most Recent Labs:     Recent Results (from the past 24 hour(s))     GLUCOSE, POC          Collection Time: 01/12/21  1:14 PM         Result  Value  Ref Range            Glucose (POC)  155 (H)  65 - 105 mg/dL       GLUCOSE, POC          Collection Time: 01/12/21  6:35 PM         Result  Value  Ref Range            Glucose (POC)  132 (H)  65 - 105 mg/dL       GLUCOSE, POC          Collection Time: 01/12/21  9:27 PM         Result  Value  Ref Range            Glucose (POC)  113 (H)  65 - 105 mg/dL              XR Results:   Results from Hospital Encounter encounter on 01/04/21      XR ELBOW LT MIN 3 V      Narrative   Indication: Fell.      Impression   IMPRESSION: 3 views of the left elbow reveal no fracture, dislocation or foreign   body.         CT Results:   Results from Hospital Encounter encounter on 01/04/21      CT HEAD WO CONT      Narrative   Clinical history: Subdural hygroma      EXAMINATION:   CT scan of the head without contrast 01/06/2021. 4 mm axial scanning is performed   from the skull base to the vertex. Coronal and  sagittal reconstruction imaging   has been obtained.      Correlation: 01/05/2021      FINDINGS:   Visualized paranasal sinuses and mastoid air cells are clear. There are   intracranial vascular calcifications.      Cerebral atrophy and periventricular microvascular ischemia. Left frontal scalp   hematoma.      12 mm left frontal extra-axial fluid collection not significantly changed from   prior study. No edema of the adjacent cortex. Approximately 5 mm left to right   midline shift slightly progressed since prior study.      Impression   IMPRESSION:   1. 12 mm left frontal subdural hygroma not significantly changed from prior   study   2. 5 mm left to right  midline shift, slightly progressed (prior measurement 4   mm).   3. Left frontal scalp hematoma.   4. Atrophy and periventricular microvascular ischemia.         MRI Results:   No results found for this or any previous visit.         Nuclear Medicine Results:   No results found for this or any previous visit.         [place] Results:   No results found for this or any previous visit.         IR Results:   No results found for this or any previous visit.         VAS/US Results:   No results found for this or any previous visit.          Discharge condition:  Stable.       Time spent with discharging patient: 45 minutes.          Dr. Brent General, thank you for allowing [place] to participate in the care of this patient.  Please contact me through the hospital if questions arise.      Dragon medical dictation software was used for portions of this report. Unintended errors may occur.       Faythe Ghee, D.O.   [facility]

## 2021-01-13 NOTE — Progress Notes (Signed)
 OCCUPATIONAL THERAPY TREATMENT       Patient: Beverly Nguyen (86 y.o. female)  Room: 6620/6620    Primary Diagnosis: Subdural hematoma (HCC) [S06.5X9A]  Fall [W19.XXXA]  Dementia (HCC) [F03.90]   Procedure(s) (LRB):  NEUROVASCULAR PROCEDURE (N/A) 6 Days Post-Op  Date of Admission: 01/04/2021   Length of Stay:  9 day(s)  Insurance: Payor: [facility] MED[facility] / Plan: [facility] MEDICARE A & B / Product Type: Medicare /      Date: 01/13/2021  In time:  1205         Out time:  1243    Precautions:none    Ordered Weight Bearing Status: NWB LUE with sling for comfort due to left shoulder nondisplaced distal clavicle fracture    Isolation:  There are currently no Active Isolations       MDRO: No active infections     ASSESSMENT:    Based on the objective data described below, the patient presents with     -  patient continues to have functional limitations in activities of daily living, functional mobility, functional balance, transfers, sitting balance, standing balance and activity tolerance.  -  patient requiring moderate encouragement to participate in OT and encouraged OOB activity.  -  Pt performed grooming task using the RUE (combing hair/wash face) while sitting EOB  -  LUE sling in place, however pt continues to use the LUE despite max verbal/tactile cues due to dementia   - performed sit/stand x5 trials with cues to promote postural awareness  -  Legally blind  -  Plans to D/C to SNF       PLAN: Continue OT POC    Recommendations:  Recommend continued skilled occupational therapy intervention to address above impairments.  Recommend out of bed activity to counteract ill effects of bedrest, with assistance from staff as needed.    Discharge Recommendations: skilled nursing facility (SNF).  Further Equipment Recommendations for Discharge: DME needs to be determined at time of d/c from rehab     Education/ communication:     Barriers to Learning/Limitations:  yes;  other baseline dementia, legally blind  Education provided to:  Patient on (+) role of OT, (+) OT plan of care, (+) instructed patient on the importance of activity while hospitalized to prevent a decline in function, (+) weight bearing and LUE precautions/sling, (+) functional mobility  Educational Handouts issued: none this session  Patient / Family readiness to learn indicated by: no evidence of learning    SUBJECTIVE:   Patient I have to use it. regarding the LUE and NWB/sling precautions    OBJECTIVE DATA SUMMARY:   Orders, labs, occupational therapy and chart reviewed on Beverly Nguyen. Communicated with nursing staff. Patient cleared to participate in Occupational Therapy treatment.    Most recent value for oxygen in flow sheets:  O2 Device: None (Room air) (01/12/21 2030)        Patient found: Bed, (+) telemetry, (+) IV, (+) pure wick urine system    Pain assessment: pt would occassionally state, OWE when touched. Pt complained of her back hurting as well when sitting upright unsupported.    Cognitive:   Mental status:   Orientation: Patient is oriented to person only    Activities of Daily Living:  Grooming -  min. assist (combing hair/washing face--pt refused to attempt brushing teeth)    Mobility:  Supine to sit -  min. assist, increased time and HOB evaluated   Sit to Stand -  min. assist and 1 person  Stand to Sit -  min. assist and 1 person    Transfers:  Bed to bedside chair -  min. assist, 1 person and hand held assist (LUE in sling)    Balance:  Static Sitting Balance -  good-  Dynamic Sitting Balance -  fair+  Static Standing Balance -  fair-    Activity Tolerance:   fair+ and complaint of fatigue after standing activity.    Therapeutic Exercises:   none this session    Final Location: all needs within reach, agrees to call for assistance, (+) bed/chair exit alarm, nursing staff notified    Beverly Nguyen, OT  January 13, 2021

## 2021-01-13 NOTE — Progress Notes (Signed)
[facility]    Patient meets the following criteria for IV to PO conversion:  Tolerating PO diet and other orally administered medications  >84 yo  No exclusion criteria noted (active GIB, gastric outlet obstruction, ileus, or motility disorder expected to significantly impair absorption)    Pantoprazole IV 40 mg every 24 hours was changed to Pantoprazole PO 40 mg every 24 hours per automatic IV to oral medication conversion program policy.    Loni Beckwith, PharmD [phone] M-F [phone]) [phone] All Other Days/Times)

## 2021-04-22 NOTE — Hospice (Signed)
Ordered hospital bed, gel pad, beside table, and transport WC from [facility] to be delivered 04/23/21 between 0900-1300; confirmation # [id number]

## 2021-04-25 NOTE — Hospice (Signed)
Post-admission follow-up. Pt received in transport char in her room. No complaints of pain or discomfort. CG reports needing bed rails. [facility] order placed. 24 hour hospice number reinforced.

## 2021-04-27 ENCOUNTER — Encounter

## 2021-04-27 NOTE — Hospice (Signed)
Patient awake and alert sitting up on the side of the bed.  Patient denies any pain and discomfort.  Caregiver denies patient receiving any PRN pain medication.        Per POA, patient will be going to [facility] Fri -Tues morning.  Writer informed her that this nurse would reach out to the MSW who can assist with getting a travel contract for hospice while she is going in the event that anything happens.      Bath aide at the bedside.  Assists patient to the shower chair for bath.      Per POA, patient has been eating well, small meals like she usually does, but small meals.

## 2021-04-27 NOTE — Hospice (Signed)
The following standard precautions for infection control were utilized:  Mask & Eye protection  Patient was in bed resting. Caregiver is currently in therapy to manage her emotions because she is bringing in another family to provide care to. Will follow up with patient and caregiver.

## 2021-04-28 ENCOUNTER — Encounter: Admit: 2021-04-28 | Discharge: 2021-04-28 | Payer: MEDICARE

## 2021-04-28 NOTE — Hospice (Signed)
Prior to the visit, the patient was screened for the following:  1.)International travel in the last month:          no    2.)Exposure Screening:  Contact with anyone with the following symptoms in the last 14 days:  None    a.)Fever, or lower respiratory illness (shortness of breath or cough) or   b.)Fever with severe acute respiratory illness (pneumonia or acute respiratory distress syndrome)     3.)The patient has the following symptoms:    a.)Fever, cough, shortness of breath  If the patient has the above symptoms:  The home health attending or hospice medical director will be contacted and scheduled patient visits will be placed on hold until the patient is confirmed to be positive or negative.  An order will be obtained to hold visits until test results are confirmed.  If negative, patient visits will resume.  If positive, a delay in care order will be obtained as well as orders to restart care after the 14 day isolation period.    During the 14 day isolation period the patient will be contacted by their primary case manager 2 times a week to check on patient status and to make sure they have no questions about medications, disease process, and that they have had no changes in condition.  The patient will be instructed to contact home health or hospice during the delay of care for any changes in conditions, questions, or concerns.    The following standard precautions for infection control were utilized:  face mask, hand sanitizer, eye protection    The purpose of today's visit was to complete a psychosocial assessment for Beverly Nguyen.  Beverly Nguyen is a 86 year old, female with admitting diagnosis of Alzheimer's Dementia.  Social worker was greeted at the door by Beverly Nguyen, Beverly Nguyen.  The home is well cared for and clutter free.  Patient's room is in upstairs and the stairs are blocked by a piece of furniture that can be easily moved.  PT was in the bathroom upon arrival and was experiencing some anxiety in  having to meet with the SW.  Because of her mental deficits, she was unclear of what would be required of her during our visit.  CG helped PT with some breathing exercises while she was in the bathroom.  Once PT returned to her bed and greeted by SW, she appeared more relaxed through our assessment.  PT was alert and oriented  to self and place.  She had difficulty recalling time and did not give appropriate answers in recalling certain events.  Long term memory appears to still be correct for recalling family events, names and life events.  PT has 1 surviving adult son.  PT had three children, 1 passed in infancy and 1 passed from a dirt bike accident at the age of 84.  She has been widowed for 16 years (she stated 6 years, CG was able to correct).  Her husband was retired from the [employer] and Beverly Nguyen was able to recall much of her life traveling with him to different posts.  She is receiving military widow benefits and was assessed for aids and attendance, but because of income, did not qualify.    CG reports that PT often confuses day and night stating she wanders the upstairs in the night.  She is continent of bowel and bladder, but requires help with all other ADL's. CG states PT has a good appetite but requires cues to eat.  CG states PT tries to dress herself but often ends up with clothes on the wrong area of her body (shirts used as skirts, both legs in one pant leg)  CG is ex-daughter in law and willing and able to care for PT.  She has known the PT for over 40 years and was able to report information that PT could not recall.  CG is currently on FMLA until the end of July and will reassess continuing work or retirement at that time.  CG is a moderate risk for bereavement as she recently lost a nephew (71 yo) that she helped to raise.  She has hired a private CG to begin in approx 1 week.  CG and PT have a good rapport and often use humor to deal with Alzheimer's related behaviors.  CG feels well equipped to  care for patient. She agrees to visits by SW @ 1 x month, with added PRN visits.

## 2021-04-29 ENCOUNTER — Encounter: Admit: 2021-04-29 | Discharge: 2021-04-29 | Payer: MEDICARE

## 2021-04-30 ENCOUNTER — Encounter: Admit: 2021-04-30 | Discharge: 2021-04-30 | Payer: MEDICARE

## 2021-04-30 NOTE — Hospice (Signed)
Routine SN visit. Pt remains in home of POA. Is pleasantly confused. POA reports pt gets up at night, but is monitored with video monitor for safety. POA is primary caregiver and reports no speciic pt. problems at this time except one episode of diarrhea last night. Caregiver to monitor bowel status and report continued diarrhea to hospice nurse if it occurs.    Medication refills ordered this visit: None    Medications reconciled and all medications are available in the home this visit. Medications are effective at this time.      Supplies by type and quantity ordered this visit include: None

## 2021-05-04 ENCOUNTER — Encounter

## 2021-05-05 ENCOUNTER — Encounter: Admit: 2021-05-05 | Discharge: 2021-05-05 | Payer: MEDICARE

## 2021-05-05 NOTE — Hospice (Signed)
 Pt.remains at home of POA. Came back last evening from and out-of-town trip with POA. Caregiver reports sh toleratee trip wells. Is in denial that her ituation d the trip well. Is alert, oriented to person and place today and respondingappropriately to assessment questions. In denial that she is ow living permanently with POA, stating I want to go beck to my home. Review of systems unremarkable with the exeption of the deveopment of a small, stage 1 pressure ulcer which is red, has no broken skin, and measures approximately 3 cm. X 4 cm.. Caregiver is applying barrier cream. Caregive encouraged to turn pt. frequently and continue to encourage ambulation with walker as long as it remains safe and pt. tollerates it well. Pt currently denies any pain.    Medication refills ordered this visit: None    Medications reconciled and all medications are/are not available in the home this visit.  The following education was provided regarding medications, medication interactions, and look alike medications: Encouraged caregive to continuer to administer anti-anxiety medication for increased anxiety at h.s. Caregiver report she did this last night and pt. was able to sleep through the night. Caregiver states she will continue with this regimen.. Medications are effective at this time.

## 2021-05-06 ENCOUNTER — Encounter

## 2021-05-07 ENCOUNTER — Encounter: Admit: 2021-05-07 | Discharge: 2021-05-07 | Payer: MEDICARE

## 2021-05-07 NOTE — Hospice (Signed)
Pt. remains status quo. Reveiw of systems unremarkable with exception of noted stage 1 decubitus ulcer (3cm.  x4 cm.) on coccyx area, which was cleansed and dressed. No other concerns voiced by pt. or caregiver.    Medication refills ordered this visit: None    Medications reconciled and all medications are available in the home this visit.  The following education was provided regarding medications:medication:Medications are effective at this time.  Rienforced with caregiver that anti-anxiety medication could be used at h.s to facilitate sleep, to be administered 30 minutes prior to bedtime. Caregiver voiced understanding.    Supplies by type and quantity ordered this visit include: None

## 2021-05-08 ENCOUNTER — Encounter

## 2021-05-11 ENCOUNTER — Encounter: Admit: 2021-05-11 | Discharge: 2021-05-11 | Payer: MEDICARE

## 2021-05-11 NOTE — Hospice (Signed)
 Upon arrival patient resting in bed in no apparent distress leaning towards her left side with television on.  Patient arouses to verbal stimuli.  Asks this nurse how she is doing.  Patient statees she is doing well.  Patient denies any pain and discomfort when asked.      Per note from caregiver, patient is not sleeping.  She states that she gave patient 0.5mL of Lorazepam last night, but she was constantly up peeing.      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  She also states that she asked the bath aide to give her a bath in the chair.    Patient states that she had a sandwich and an applie for lunch.      Sometimes I leave the television on all night for attention.  Writer asked her if she meant the noise.  Patient stated Yes the noise.  Writer asked her if the television could possibly keeping her awake.  Patient denies.  She states I think I have a lot of stuff on my mind.  When asked about what, patient states, the problems of the day, the problems of my childhood.  After a few moments she states, I don't like being here, I want to go home.  I want to be home, which I know is impossible right now.     When palpating paient legs for edema, patient states, That's not fun.      LBM:  Today per patient    Writer assisted patient to the bathroom as she states, I just have to get to the bathroom real soon.  Patient ambulated with this nurse's assistance.  Unsteady gait.  Writer informed patient to let this nurse know when she was done so that Clinical research associate could escort her back.  Patient informed Clinical research associate when she was complete.  Writer watched patient pick up her pants to ensure she did not fall.  Writer then walked patient to sink to wash her hands.  While in front of the sink, patient states, I really need to lay down soon, my legs feel like they are going to give out.  Writer informed her that this nurse is there with her so that she doesn't fall.  She then sttes that, I know, I just wanted to  let you know how they are feeling.  Wrier escorted patient back to her bed and assisted with situating patient in bed.  Patient feet noted to appear swollen and purple while ambulating.  Feet return to normal color after being placed in elevated position on bed.    Supplies ordered via [employer]: 3 packs chux; 3 packs medium diapers; barrier cream; foley catheter; catheter holder    Medications ordered through [employer]: Namenda, aricept and Synthroid.  Confirmation number: [id number]

## 2021-05-13 ENCOUNTER — Encounter

## 2021-05-14 ENCOUNTER — Encounter: Admit: 2021-05-14 | Discharge: 2021-05-14 | Payer: MEDICARE

## 2021-05-14 NOTE — Hospice (Signed)
 Pt.lying in bed, alertand responsive to verbal stimulation. Caregiver reports pt. up at 0100 this morning despite 2030 ose of trazadone. she reports pt. was up and went over to roomat'e bed, turned on the lights and became very  loud. Was given a dose of lorazepam ar 0200 by caregiver, which was effective. Review of systems unremarkable. Pt. eating a llittle less according to caregiver. *    Medications reconciled and all medications are  available in the home this visit.  The following education was provided regarding medicatins: Administration of trazadone about 30 inutes befor bedtime to promote sleep, and continued use of lorazepam to decrease anxiety and agitation.. Medications are effective at this time.

## 2021-05-15 ENCOUNTER — Encounter

## 2021-05-18 ENCOUNTER — Encounter

## 2021-05-19 ENCOUNTER — Encounter: Admit: 2021-05-19 | Discharge: 2021-05-19 | Payer: MEDICARE

## 2021-05-19 NOTE — Hospice (Signed)
Pt. lying in bed, appropriately responsive to voice. Caregiver reports pt. apetite is declining somewhat, but still on regular diet eating two meals daily. Right upper arm circumference 20 cm. Vitals WNL. Foley #16 [place] catheter inserted with return of clear straw colored urine to relieve urinary incontinence and preserve skin integrity. Pt. tolerated procedure well. Review of systems otherwise unremarkable for change from prior visits.  No medications or supplies ordered this visit.

## 2021-05-20 ENCOUNTER — Encounter

## 2021-05-21 ENCOUNTER — Encounter

## 2021-05-21 ENCOUNTER — Encounter: Admit: 2021-05-21 | Discharge: 2021-05-21 | Payer: MEDICARE

## 2021-05-21 NOTE — Hospice (Signed)
SN Hospice Visit. Pt.lying in bed. When questioned about fall yesterday, caregiver states pt. fell while attempting to get out of bed without assistance and hit her head on bedside table. Had an abrasion on the posterior left side of her head, which was cleansed with soad and water and a band aid applied. Caregiver also gave pt. tylenol for a h/a. No other apparent injury (see fall tracking note).   Early this morning, pt. dislodged foley catheter while transfering from bed. New foley #16 [place] inserted with 10 ml. NS balloon. Pt tolerated procedure well. Stressed to caregiver that pt. should not get OOB without assistance, and bedrails should remain up. Caregive verbalized understanding of instruction.  VS were WNL. Review of systems unremarkable with exception of improvement of pressure area on sacral region and perineum.

## 2021-05-21 NOTE — Hospice (Signed)
The following standard precautions for infection control were utilized:  Mask  Upon Chaplains arrival at the home patient was still sleeping so caregiver and chaplain decided to follow up another day.

## 2021-05-22 ENCOUNTER — Encounter

## 2021-05-25 ENCOUNTER — Encounter: Admit: 2021-05-25 | Discharge: 2021-05-25 | Payer: MEDICARE

## 2021-05-25 ENCOUNTER — Encounter

## 2021-05-25 NOTE — Hospice (Signed)
The following stand precautions for infection control were utilized:  Mask  Patient was in bed esting but would talk when asked any question. Will follow up with case manager and family.

## 2021-05-25 NOTE — Hospice (Signed)
Patient does not remember eating breakfast this morning, does not remember being up nor does she remember waking care giver during the middle of the night.      Patient is taking Trazadone and Lorazepam to sleep at night.      Talking to a nutritionist for feeding patient.      Sleeping - mediocre per caregaver 2-3 a night.  She will yell caregivers name and tell her what to do.            Risperdal reordered through [place], confirmation number: [id number]    Received text message after visit that refill for Trazadone was needed as well.  Medication refilled through [facility], confirmation number [id number]

## 2021-05-27 ENCOUNTER — Encounter

## 2021-05-28 ENCOUNTER — Encounter: Admit: 2021-05-28 | Discharge: 2021-05-28 | Payer: MEDICARE

## 2021-05-28 ENCOUNTER — Encounter

## 2021-05-28 NOTE — Hospice (Signed)
 SN hospice visit. Pt.in bed dozing on arrival. Responsive to questions. Denies any pain. Resp. are regular and shallow. Catheter is patent and draining clear yellow urine. Review of systems uremarkable. Placed foam booties on heels bilaterally to protect skin integrity. Last B/M today. No eidence of pain or anxiety from pt. or caregiver. Sleeping 12-14 hours pr day.  Caregiver reports pt.states she Beverly Nguyen on a trip and met with all of her 14 cousins for the last time. Appears to be reliving past experiences.  Ordered diapers and waipes.   Confirmation # [id number]

## 2021-05-29 ENCOUNTER — Encounter

## 2021-06-01 ENCOUNTER — Encounter

## 2021-06-02 ENCOUNTER — Encounter: Admit: 2021-06-02 | Discharge: 2021-06-02 | Payer: MEDICARE

## 2021-06-02 NOTE — Hospice (Signed)
up in wheel chair lethargic  fed by care giver    teeth getting too large, hanging out.  Educated c/g about ensuring she didn't choke on them.      patient tired, falls to sleep very easily    per caregiver patient was calling out for her mother all night and this morning patient informed caregiver that she "started her period"     breakfast and dinner.  comes in throughout the day to give her drinks.      patient did not sleep yesterday.  gives 2000 trazadone and 0.75 at 2200.  patient fought it all night, was calling out for her mother.   Spoke to her family yesterday on the phone.    incontience this morning, LBM  foley draining clear yellow urine with sediment    caregiver giving patient's Boost/Ensure    Visibly thinner, weaker    Per caregiver, patient can no longer able to roll in bed, cannot transfer to chair, cannot come from supine to sit, cannot sit to stand, cannot walk unassisted, needs cuing and sometimes help with repositioning.  Per caregiver will ask for assistance with placing her legs in the bed, but c/g states she can do it herself.  She states that "we have been practicing this"    Having inccontience episode and patient is unaware that it is happen    Medications: aricept, synthroid, mementine. [facility] Confirmation #: [id number]  Supplies: None needed

## 2021-06-02 NOTE — Hospice (Signed)
The following standard precautions for infection control were utilized:  face mask, hand sanitizer.    The purpose of today's visit was to complete a routine SW visit.  SW was greeted at the door by CG, [place].  The home is well cared for and there are no visible safety concerns.  SW and CG walked upstairs to the room, where pt share a room with another pt.  SW greeted pt and pt was able to respond, hello.  According to [facility], Ms. Boxwell has had a notable decline since SW last visit.  She stated pt is no longer ambulatory, unable to eat without assistance and cueing.  Pt has a foley, but is able to use bedside commode for bowel movements.  CG stated that pt currently has her days and nights mixed up and is sleeping more.  Pt was able to answer SW questions with yes or no, but had to be aroused from sleep to answer.  CG stated that respite care will be needed in October.  She stated that she would like to see if pt is able to go to [facility] in [place] during respite because a family member works there and it would be comforting to the pt.  SW explained respite to CG in detail and she voiced understanding.  Volunteer services are also requested.  An e-mail will be sent to [facility] to request services when available. No other needs expressed at this time.

## 2021-06-03 ENCOUNTER — Encounter

## 2021-06-04 ENCOUNTER — Encounter: Admit: 2021-06-04 | Discharge: 2021-06-04 | Payer: MEDICARE

## 2021-06-04 NOTE — Hospice (Signed)
patient in bed, a/ox1 and low tone verbal answers,1-2 words denied pain   caregiver complaint of leakage from catheter, assessed the balloon amount by deflating, added 39ml to balloon and reinserted the catheter into bladder and patency noted with increased urine and no leakage     Medication refills ordered this visit: none    Medications reconciled and all medications are/are not available in the home this visit.  The following education was provided regarding Haldol use vs Lorazepam medication.  Response to teaching: verbalized understanding. Medications are nable to detrmine effectiveness at this time.      Supplies by type and quantity ordered this visit include: none    Consulted medical director/attending physician regarding: no    Instructed patient/family/caregiver on 24-hour hospice availability and phone number.    Plan for next visit: Halodol effectiveness vs Lorazepam

## 2021-06-05 ENCOUNTER — Encounter

## 2021-06-08 ENCOUNTER — Encounter: Admit: 2021-06-08 | Discharge: 2021-06-08 | Payer: MEDICARE

## 2021-06-08 ENCOUNTER — Encounter

## 2021-06-08 NOTE — Hospice (Signed)
Patient presents in bed, just finished with her bath.  Offers no complaints of pain or discomfort.  Vitals within normal limits.  Caregiver states that patient had and episode while on the commode this weekend and was not responding to verbal cues.  Once patient was back in bed she was once again coherent.  Caregiver is also stating that she has to feed the patient now.  Patient is chewing and swallowing without difficulty.  Today patient is alert and responsive.  Answering questions without slurring and no facial drooping is noted.  Bilateral grips are even.       Pain:Offers no complaints of pain    Medication refills: Meds were refilled last week    Medications reconciled and all medications are available in the home this visit.  The following education was provided regarding medications, medication interactions, and look a like medications: Medication side effects, dosages, purposes, frequencies.  Response to teaching: Verbalized understanding. Medications  are effective at this time.      Supplies by type and quantity ordered/delivered this visit include: No supplies needed per caregiver Jenna Luo attending physician regarding: Not needed this visit    Instructed patient and family on 24-hour hospice availability and phone number.

## 2021-06-10 ENCOUNTER — Encounter

## 2021-06-11 ENCOUNTER — Encounter

## 2021-06-11 NOTE — Hospice (Signed)
Friday NP here - mini strokes.  Patient fell out in the bathroom, took caregivers to carry her back        Patient resting in bed, arouses easily to verbal stimuli.  Speech a little slurred, possibly due to false teeth shifting.  Patient behavior indicators negative for pain and discomfort.  Caregiver state that patient has not needed any.      Haldol is working 100%.  She states that NP states that she may not need Trazadone.  Patient is sleeping well with Haldol, caregiver states that patient is sleeping through the night.      Having to feed patient because she is so shaky per cg.  When caregiver feeds her, patient will eat 100%.  Caregiver states that it is taxing for paitent to feed herself, that her mentation changes like her BP is dropping.      LBM yesterday, normal.  Cg states she has had one daily except for today.    Writer spoke with NP, she states that the current regimen of Trazadone and Haldol are working for the patient, so she does not want to change.  Writer send refill for Trazadone to local pharmacy.  [facility] confirmation number: [id number]

## 2021-06-12 ENCOUNTER — Encounter

## 2021-06-15 ENCOUNTER — Encounter

## 2021-06-16 ENCOUNTER — Encounter: Admit: 2021-06-16 | Discharge: 2021-06-16 | Payer: MEDICARE

## 2021-06-16 NOTE — Hospice (Signed)
 Patient resting in bed quietly in no apparent distress, supine on room air.  Patient behavior indicators negative for pain and discomfort.  Patient arouses easily to verbal stimuli but does not open eyes.  Patient denies pain and discomfort.  Patient very calm and relaxed during VS and drifts to sleep when not actively engaged by this nurse.  Writer asked patient how she was.  Patient states, okay I guess, you haven't told me other wise.  Patient skin dry and intact.  Patient stage 1 on sacral area darker pinkish-red color than last visit.  Attempted to get patient to stay on side, patient rolled right back being supine.      Patient had a small, BM.  Incontinence care provided.  Patient more lethargic and needing more assistance this visit.  Writer placed purple heel boots on patient as it appears that patient is digging her heels in the bed.  Writer asked patient if she was comfortable with the boots, patient verbalized she was.           Supplies needed: diapers, chux.  Supplies ordered through [facility].  Order #: [id number]    Meds needed: namenda, donapezil, symthroid.  Medications ordered through [facility] app for home delivery. Confirmation number: [id number]

## 2021-06-17 ENCOUNTER — Encounter

## 2021-06-18 ENCOUNTER — Encounter: Admit: 2021-06-18 | Discharge: 2021-06-18 | Payer: MEDICARE

## 2021-06-18 NOTE — Hospice (Signed)
Pt. lying in bed throughout visit. Was alert and able to repond appropriately. Appears much more frail and cachexic than when this writer last saw her (about two weeks ago. Noted improvement in sacral ulcer.    Medication refills ordered this visit: none    Medications reconciled and all medications are available in the home this visit. Medications are effective at this time.    Rienforced patient caregiver on 24-hour hospice availability and phone number.

## 2021-06-19 ENCOUNTER — Encounter

## 2021-06-22 ENCOUNTER — Encounter

## 2021-06-23 ENCOUNTER — Encounter: Admit: 2021-06-23 | Discharge: 2021-06-23 | Payer: MEDICARE

## 2021-06-23 NOTE — Hospice (Signed)
 Patient resting in bed in no apparent with eyes open.  Patient speaks with verbal stimuli.  When asked how she is doing, patient states that she is mmmm okay.  Patient lying on right side with hands and covers tucked up under her chin.  Patient behavior indicators negative for pain and discomfort.        Patient catheter draining hazy amber urine.      Patient anxious while receiving care.  patient would forget what this nurse just told her would happen and get scared.  Writer reassured patient and explained what was going on.      Writer provided incontinence care for a small, brown bowel movement.  Sacral area is reddened and dry with a 2 cm opening in middle covered with zink paste.      Caregiver reports a decreased appetite.  He states that patient will often refuse meals and if she does eat, it is about 1/4 of a sandwich and a few sips of water.     Patient left eye is deviating outward with sluggish pupillary reaction.       Caregiver educated on offering patient food, but not forcing.  He states that he lets her sleep if she wants to and if she does not want to eat, he does not force it.          Synthroid - 9 pills, 2Aug22; Memantine - 9 pills, 2Aug22; donepezil - 9 pills, 2 Aug22  confirmation number: [id number]      Med refills needed: Haldol, Trazadone, Zoloft

## 2021-06-24 ENCOUNTER — Encounter

## 2021-06-25 ENCOUNTER — Encounter: Admit: 2021-06-25 | Discharge: 2021-06-25 | Payer: MEDICARE

## 2021-06-25 NOTE — Hospice (Signed)
Patient presents in bed on her left side sleeping.  No complaints of pain this visit.  Patient pleasantly confused.  Vitals within normal limits.  Brief changed and wound to sacral area rclosed but reddened.                     Pain:Denies pain    Medication refills: None needed    Medications reconciled and all medications are available in the home this visit.  The following education was provided regarding medications, medication interactions, and look a like medications: Medication side effects, dosages, purposes, frequencies.  Response to teaching: Caregiver verbalizes understanding. Medications  are effective at this time.      Supplies by type and quantity ordered/delivered this visit include: Supplies delivered today    Consulted attending physician regarding: Not needed    Instructed patient and family on 24-hour hospice availability and phone number. [place]

## 2021-06-26 ENCOUNTER — Encounter

## 2021-06-29 ENCOUNTER — Encounter

## 2021-06-30 ENCOUNTER — Encounter

## 2021-06-30 NOTE — Hospice (Signed)
Patient laying in bed awake listening to television.  Patient alert.  Patient denies any pain and discomfort.  Patient behavior indicators negative as well.  Foley changed, Patient anxious about "what you are going to do to me."  Writer explained to patient the purpose of writer's visit.  She states, "oh Beverly Nguyen, today is Wednesday."  Writer informed her that today was Tuesday not Wednesday.  Patient thanked this Clinical research associate.  While assessing patient eyes, pupils sluggish to react bilaterally and it appeared as if patient was looking right through this nurs.    18Fr with 10mL balloon.    Peri area red/inflammed irritatted.  Put barrier cream on.  Stool in vaginal area.      Patient became more anxious after catheter change.  Patient kept yelling out that it hurts.  Writer informed her that with the catheter change it would hurt for a bit.  When it did not stop hurting in a few minutes she kept hollering, "OH God, when will it stop hurting?"  Writer attempted several times to redirect patient with deep breathing exercise, counting to 10, then 30, then counting her age.  Patient was not anxious during the counting but as soon as she was done, she was anxious again.  Writer had to direct patient hand away from her diaper and pulling at the catheter.  Writer gave patient 0.72mL of Ativan.  Caregiver notified.              Creams needed.      Medications: Donezepil 3 tabs in bottle; Zoloft 11 tabs in bottle; memantine 18 pills in bottle; levothyroxine 19 tabs in bottle; 30mL bottle of Haldol

## 2021-07-01 ENCOUNTER — Encounter: Admit: 2021-07-01 | Discharge: 2021-07-01 | Payer: MEDICARE

## 2021-07-01 ENCOUNTER — Encounter

## 2021-07-01 NOTE — Hospice (Signed)
The following standard precautions for infection control were utilized:  Mask  Patient was in bed and would respond when asked a question. Chaplain will follow up with caregiver and case manager.

## 2021-07-02 ENCOUNTER — Encounter

## 2021-07-03 ENCOUNTER — Encounter

## 2021-07-03 NOTE — Hospice (Signed)
 Patient alert, staring blankly at the wall yelling hello, is anybody out there.  When writer enters room and says hello, patient says, well its about time someone comes.  Patient whines that she wants to get out of bed, that she is sick of laying here.  Informed her it is safest for her to stay in the bed.  Patient states she isn't going to fall, that she doesn't have any shoes or socks on so they tell me.  Writer asked patient if she was hungry, she states, not really, just a tingle.      Patient denies pain and discomfort.  Behavior indicators negative as well.      Catheter bag and catheter tubing stained purple color.  Urine is a tea color.  Patient sclera appears swollen and has gunk in her eyes.      Patient left laying on her left side with wedge under her back.      Patient rambles, speaking non-sensically about different things while bedside.  She was talking about throwing things over board, informed this nurse she needs to get up to go on a trip           Spoke with [facility].  Informed him of patient symptoms.  Received order to monitor patient and watch for any signs of UTI, hold of an antiviotics for now.  Received order for Gentamicin 0.3% drops. 2 drops Q6H for 5 days.  Medication ordered through local pharmacy.  COnfirmation number [id number]

## 2021-07-06 ENCOUNTER — Encounter

## 2021-07-07 ENCOUNTER — Encounter: Admit: 2021-07-07 | Discharge: 2021-07-07 | Payer: MEDICARE

## 2021-07-07 ENCOUNTER — Encounter

## 2021-07-07 NOTE — Hospice (Signed)
Patient presents in bed on her left side.  Pleasantly confused.  No signs of distress, patient denies pain at this time.  No outward signs of pain.                   Medication refills: Zoloft, aricept    Medications reconciled and all medications are available in the home this visit.  The following education was provided regarding medications, medication interactions, and look a like medications: Medication side effects, dosages, purposes, frequencies.  Response to teaching: Verbalized understanding. Medications  are effective at this time.      Supplies by type and quantity ordered/delivered this visit include: No Supplies ordered    Consulted attending physician regarding: Not needed this visit    Instructed patient and family on 24-hour hospice availability and phone number.

## 2021-07-08 ENCOUNTER — Encounter

## 2021-07-08 NOTE — Hospice (Signed)
Caregiver called 24 hour hospice line to request a PRN visit as the patient was vomiting and having loose stool since Sunday.  Patient started Cipro for UTI on Saturday evening.  Has since had stomach pains, emesis and diarrhea since last night.  Dr. Alwyn Pea was contacted and his recommendation was to stop the Cipro and monitor.  Next visit tomorrow to follow up and report back to MD.

## 2021-07-09 ENCOUNTER — Encounter: Admit: 2021-07-09 | Discharge: 2021-07-09 | Payer: MEDICARE

## 2021-07-09 NOTE — Hospice (Signed)
The following standard precautions for infection control were utilized:  hand sanitizer and face mask.    The purpose of today's SW visit was to complete a monthly visit to assess any changes in needs.  SW was recieved at the door by [place], CG/partner of main CG, Darel Hong.  The home is clean and tidy and there are no visible safety issues noted. CG reported that there has not been too much change in the pt's condition.  CG states pt is having a steady decline, eats minimally, sleeping increased and remains in the bed about 20 hours per day.  CG reports that main CG gets pt up in the evening for about an hour.  CG reports pt is confused most times, but can express needs.  Pt was a sleep in her bedrom when SW visited.  She appeared comfortable and well cared for.  SW did not try to wake pt.  LPN Melise had just finished her assessment and stated she hardly woke during her assessment.  Sieg stated that pt often wakes in the night and screams out to see if anyone is there and stating she has to use to bathroom.  CG's have to reind pt that she has a catheter.  No other needs expressed at this time.

## 2021-07-09 NOTE — Hospice (Signed)
Patient presents in bed on her left side.  No signs of distress noted.  No complaints of pain, vitals within normal limits.  Loose BM last night per caregiver Darel Hong.  Soft BM this morning when this nurse checked patient.  Foley catheter noted to be twisted several times at the stat lock device.  This nurse straightened tubing and reattached it to stat lock.  Catheter started flowing immediately and noted in catheter bag.  Catheter flushed and balloon deflated then reinflated with 1mL NS.   Patient tolerated treatment without complaint.              Pain:Patient denies pain    Medication refills: None needed this visit    Medications reconciled and all medications are available in the home this visit.  The following education was provided regarding medications, medication interactions, and look a like medications: Medication side effects, dosages, purposes, frequencies.  Response to teaching: Verbalized understanding. Medications  are effective at this time.      Supplies by type and quantity ordered/delivered this visit include: No supplies needed.    Consulted attending physician regarding: MD notified of patient status and no further orders at this time.    Instructed patient and family on 24-hour hospice availability and phone number.

## 2021-07-10 ENCOUNTER — Encounter

## 2021-07-11 NOTE — Hospice (Signed)
PRN visit.CG reports urine in brief from around catheter. Catheter changed, positive urine flow noted. Briefs, underpads, wipes, gloves requested. 24 hour hospice number reinforced.

## 2021-07-13 ENCOUNTER — Encounter

## 2021-07-14 ENCOUNTER — Encounter: Admit: 2021-07-14 | Discharge: 2021-07-14 | Payer: MEDICARE

## 2021-07-14 NOTE — Hospice (Signed)
Patient was rolled this morning toward wall and has been asleep ever since.      Patient refused to eat this morning.  She states that patient had a rough week last week.  LBM yesterday.    C/g states that patient has flipped her days and nights.    Haldol at night before bed.  0.73mL.  Not sleeping but not yelling.  She states patient still states that she has pain in her legs, but when she comes to the patient, patient will state that she is not in pain.  Patient has no behavior indicators for pain.  Patient received 0.14mL prior to bath.  Discussed utilizing Morphine (0.40mL) prior to patient receiving bath to stave off any pain.      Patient talked to her sister on Sunday.      Awaiting supplies to be ordered.      Memantine - 4 pills  Levothyroxine - 4 pills  Donepezil - 11 tablets

## 2021-07-15 ENCOUNTER — Encounter

## 2021-07-16 ENCOUNTER — Encounter: Admit: 2021-07-16 | Discharge: 2021-07-16 | Payer: MEDICARE

## 2021-07-16 NOTE — Hospice (Signed)
Patient presents in bed on her left side.  NO signs of acute distress noted.  Patient denies pain.  Vitals within normal limits.  Sacral wound is red and raw, foam placed for protection.                  Pain:Denies pain    Medication refills: None needed  Medications reconciled and all medications are available in the home this visit.  The following education was provided regarding medications, medication interactions, and look a like medications: Medication side effects, dosages, purposes, frequencies.  Response to teaching: Verbalized understanding. Medications  are effective at this time.      Supplies by type and quantity ordered/delivered this visit include: Not needed    Consulted attending physician regarding: Not needed    Instructed patient and family on 24-hour hospice availability and phone number.

## 2021-07-17 ENCOUNTER — Encounter

## 2021-07-20 ENCOUNTER — Encounter

## 2021-07-21 ENCOUNTER — Encounter: Admit: 2021-07-21 | Discharge: 2021-07-21 | Payer: MEDICARE

## 2021-07-21 NOTE — Hospice (Signed)
NOw doing  of haldol.  Started last night.  Patient was awake until this morning; patient fell asleep at about 0715 this morning.      patient arouses easily to verbal stimuli. Patient speech is garbled.  Face is visibly thinner with cheeks sunken in and temporal wasting noted as well.    Wrier provided incontinence care to patient.  Patient reddened area on sacral area red and inflammed with peeling skin.  Caregiver states that when another caregiver came, they put a foam dressing on patient and the adhesive broke patient skin out.  Caregiver states that shehas been putting zinc cream on area.                Medications needed: Memantine, Levothyroxine and Tramadol.  Ordered through [place] for home delivery.  Confirmation number: [id number]

## 2021-07-22 ENCOUNTER — Encounter: Admit: 2021-07-22 | Discharge: 2021-07-22 | Payer: MEDICARE

## 2021-07-23 ENCOUNTER — Encounter: Admit: 2021-07-23 | Discharge: 2021-07-23 | Payer: MEDICARE

## 2021-07-23 NOTE — Hospice (Signed)
Patient presents in bed on her right side.  No signs of distress and patient denies pain at this time.  Sacrum is reddened and caregiver is aware to continue Zinc and offloading to prevent skin from opening.               Pain:Patient denies pain at this time    Medication refills:Zoloft and Donepezil    Medications reconciled and all medications are available in the home this visit.  The following education was provided regarding medications, medication interactions, and look a like medications: Medication side effects, dosages, purposes, frequencies.  Response to teaching: Verbalized understanding. Medications  are effective at this time.      Supplies by type and quantity ordered/delivered this visit include: None needed    Consulted attending physician regarding: Not needed this visit    Instructed patient and family on 24-hour hospice availability and phone number.

## 2021-07-24 ENCOUNTER — Encounter: Admit: 2021-07-24 | Discharge: 2021-07-24 | Payer: MEDICARE

## 2021-07-24 NOTE — Hospice (Signed)
CG reports blood in catheter, pt hallucinating. Urine cloudy, malodourous, with trace blood. Pt is seeing/speaking to deceased husband. Advised this maybe from UTI or EOL. ABX ordered. 24 hour hospice number reinforced.

## 2021-07-28 ENCOUNTER — Encounter: Admit: 2021-07-28 | Discharge: 2021-07-28 | Payer: MEDICARE

## 2021-07-28 NOTE — Hospice (Signed)
Patient presents lying in bed on her left side.  Offers no complaints of pain or discomfort.  Vitals within normal limits.  Patient began Bactrim DS on Sunday for suspected UTI.                    Pain:Patient denies pain    Medication refills: None needed this visit    Medications reconciled and all medications are available in the home this visit.  The following education was provided regarding medications, medication interactions, and look a like medications: Medication side effects, dosages, purposes, frequencies.  Response to teaching: Verbalized understanding. Medications  are effective at this time.      Supplies by type and quantity ordered/delivered this visit include: No supplies needed this visit    Consulted attending physician regarding: Not needed this visit    Instructed patient and family on 24-hour hospice availability and phone number.

## 2021-07-29 ENCOUNTER — Encounter

## 2021-07-29 ENCOUNTER — Encounter: Admit: 2021-07-29 | Discharge: 2021-07-29 | Payer: MEDICARE

## 2021-07-30 ENCOUNTER — Encounter: Admit: 2021-07-30 | Discharge: 2021-07-30 | Payer: MEDICARE

## 2021-07-30 NOTE — Hospice (Signed)
Patient presents lying in bed on her right side.  No signs of acute distress noted.  Patient spoke when this nurse said hello.  Pleasant and appears comfortable at this time.  Patient denies pain.    Caregiver concerned and reported that patient was refusing to eat, having chills and cold sweats.  Patient temp is at 97.9 today and all vials are within normal limits.  This nurse spoke with caregiver in the home and advised loss of appetite can occur at end of life and not to force the patient to eat.  Patient is not perspiring or having chills during this appointment, denies being nauseous.  When rolling patient to perform peri care patient has slight tremors in arms.  Currently patient is lying in bed on her left side with eyes closed and appears comfortable.  Caregiver advised to call 24 hour hospice number if any new issues arise.                  Pain:Patient denies pain    Medication refills: None needed at this time.    Medications reconciled and all medications are available in the home this visit.  The following education was provided regarding medications, medication interactions, and look a like medications: Medication side effects, dosages, purposes, frequencies.  Response to teaching: Caregivers verbalized understanding. Medications  are effective at this time.      Supplies by type and quantity ordered/delivered this visit include: None needed    Consulted attending physician regarding: Not needed this visit    Instructed patient and family on 24-hour hospice availability and phone number.

## 2021-07-31 ENCOUNTER — Encounter

## 2021-08-03 ENCOUNTER — Encounter: Admit: 2021-08-03 | Discharge: 2021-08-03 | Payer: MEDICARE

## 2021-08-03 NOTE — Hospice (Signed)
The following standard precautions for infection control were utilized:  Mask  Patent was resting but she responded to me calling her name. Darel Hong and I spoke today for some time becuase of the family dynamics that have come up in her life with her father. Chaplain will follow up with her and continue to enourage her to get in some self-care time.

## 2021-08-04 ENCOUNTER — Encounter

## 2021-08-04 NOTE — Hospice (Signed)
 Patient resting in bed supine on room air in no apparent distress.  Patient dentures appear to be too big and float up and down in oral cavity.  Patient behavior indicators negative for pain and discomfort.  Patient arouses to some verbal stimuli and to tactile stimuli.  When asked, patient denies pain and discomfort.  Patient speech slurred, unsure if speech is slurred due to ill fitting dentures.  Patient briefly opens her eyes, but swiftly closes them and drifts back to sleep.  Patient appears frail and cachectic.  Patinet is visibly thinner with temporal wasting noted.  Patient eyes and cheeks are sunken in.  Patient clavicle more visible than previous visit.  Cap refill >3 seconds.  Patient hannds and feet cool to touch.      Per calendar, patient had BM yesterday.  Patient has active bowel sounds all four quadrants.  Abdomen soft, non-tender    Skin is cool to touch.      Foley draining clear, tea colored urine.  Urine bag is stained purple.    Patient diaper dry, purple foot booties adjusted and replaced.  Patient declined turning and when asked she said yes to being comfortable.      Patient first 3 digits on left foot pink and blanchable.  +1 edema to left ankle.        trazadone -7  memantine - 14  zoloft - 5  donepezil - 5    No supplies needed.

## 2021-08-05 ENCOUNTER — Encounter

## 2021-08-06 ENCOUNTER — Encounter

## 2021-08-06 NOTE — Hospice (Signed)
Upon arrival patient resting in bed comfortably, patient tolerated assessment no distress, caregiver present in home and reports that she is eating pudding and applesauce, discussed with caregiver about denture care/safety and the importance of checking the patients mouth for blisters/sores, questions/concerns answered during visit, will continue to monitor.

## 2021-08-07 ENCOUNTER — Encounter

## 2021-08-11 ENCOUNTER — Encounter: Admit: 2021-08-11 | Discharge: 2021-08-11 | Payer: MEDICARE

## 2021-08-11 NOTE — Hospice (Signed)
Patient resting in bed in no apparent disress on room air.  Patient behaviour indicators negative for pain and discomfort.  Patient arouses to verbal stimuli.  Patient has a blank, vacant gaze.  She attempts to turn her eyes toward writer's voice but continues to blankly stare.  Patient has an almost frightened look on her face.  Writer inquired if the patient was in pain or if she was afraid, patient denied both.  Writer observed intermittent tremors in patient legs.  Writer adjusted patient catheter tubing and reclipped to bed for proper draining and writer adjusted purple boots on patient.  Reddening to patient 2nd, 3rd and 4th digits on left foot, where right foot was on top of left.  Area is blanchable.  Writer covered with zinc cream.  Patient declined repositioning.  Writer offered patient food and water, patient declined.        Caregiver in home reports that patient has not eaten for him as patient has been asleep all day.  He also states that patient is eating pudding and apple sauce for POA>  He infomrs this nurse that patient POA is utilizing Poly-Grip to keep patient dentures in place.      levothyroxine - 7 tabs  memantine - a lot  zoloft - 13  donepezil - 12L    trazadone - 15 tablets  Haldol - 65mL  morphine - 64mL     2 full packs diapers with some on shelf; 3 packs of chux and some on shelf, wipes and mouth swabs plenty.

## 2021-08-12 ENCOUNTER — Encounter

## 2021-08-13 ENCOUNTER — Encounter: Admit: 2021-08-13 | Discharge: 2021-08-13 | Payer: MEDICARE

## 2021-08-13 NOTE — Hospice (Signed)
The following standard precautions for infection control were utilized:  hand sanitizer, eye protection and face mask.    Today's SW visit was to conduct a monthly assessment of pt/cg needs.  SW was greeted at the front door by [place], CG.  He welcomed SW into the home.  The home is well cared for, clutter free and there are no visible safety concerns noted by SW at this time.  Pt was received in her upstairs room, in her hospital bed.  Pt was awake and SW introduced herself to pt again.  Pt appears thinner and more cachectic than last visit.  This visit is the 1st visit pt has been awake for the last 2 SW visits.  Pt is able to answer to yes and no questions.  She stated yes to being well cared for, she answered no to being in pain, she stated yes to having a decreased appetite.  Pt stated yes to being comfortable and stated no to being cold.  Pt stated yes to wanting to go to sleep, so SW said her goodbyes and went downstairs to speak with the CG.  CG states that pt appetite has decreased and sleep has increased.  CG states pt still talks/yells out through the night and day when she is awake.  CG states most of what she says is non-sensical, but she will request water when needed.  CG states her diet consist manly of jello pudding and applesauce at this time.  SW spoke about pt's difference in physical appearance since last visit.  CG agreed that she has lost weight and her coloring is changing.  CG stated that primary CG does a good job with the care of patient.  CG states that pt only gets really agitated when physical care is being done (urinary care, RN assessments).  No other needs at this time.

## 2021-08-13 NOTE — Hospice (Signed)
 Patient resting in bed on her left side on room air in no apparent distress.  Patient behavior indicators negative for pain and discomfort.  Patient arourses easily to verbal stimuli.  Patient denies pain and discomfort.      Patient stares blankly in to air.  Patient will turn her head towards voice, but eyes remain fixated ahead.      Patient asked this nurse for some water.  Writer obtained water from mini fridge.  Gave patient about 3 oz of water via straw.  Patient tolerated well and swallowed well.  When complete, patient informed thuis nurse, That's it, I am done.  Patient oral mucosa dry.  Patient declined this nurse providing oral care.      Writer checked patient.  She was clean and dry.  Writer put some zinc cream on patient sacral area and in her gluteal cleft.  Patient catheter draining deacreased, tea colored urine.      Writer asked patient if writer could roll her, she stated no I am comfortable.      Patient asked this nurse what time it was as well.    Patient trembling, writer asked her if she was cold, patient denies.  Writer tried to adjust patient sheet, patient stated, Put it back where it was.      Writer discussed visit with caregiver.  Caregiver states the patient is quieter, sleeping more when he is with her than when the POA is home.  INformed him if there were any needs to call the 24-hour number and that this nurse would be back on Tuesday.  Caregiver verbalized understanding.

## 2021-08-14 ENCOUNTER — Encounter

## 2021-08-17 ENCOUNTER — Encounter: Admit: 2021-08-18

## 2021-08-17 ENCOUNTER — Encounter

## 2021-08-17 ENCOUNTER — Encounter
Admit: 2021-08-17 | Discharge: 2021-08-18 | Disposition: A | Discharge status: Hospice | Payer: PRIVATE HEALTH INSURANCE | Source: Ambulatory Visit | Attending: Internal Medicine | Admitting: Internal Medicine

## 2021-08-17 DIAGNOSIS — G309 Alzheimer's disease, unspecified: Secondary | ICD-10-CM

## 2021-08-17 NOTE — Progress Notes (Signed)
Received pt from home via stretcher through EMT, pt eyes open spontaneously, appears non-verbal. MD paged.    2032: Pt alert and oriented to self, foley with brown output plus sediments.    Call back received from on call physician, MD not aware of pt's admission. Nursing administrator made aware.    CXR completed at bedside.    2050: Foley bag changed, puss like with sediments observed.    2052: Dr. Alwyn Pea paged at [phone]. On call RN to call back.    2112: Return call received from [place], RN hospice nurse. Per RN, pt here for imaging; to be discharged to [facility] in [place] for continuity of hospice care in the morning. No further order received at his moment.    2124: Telephone call made to pt's son Mr. Tammy Sours @ [phone] regarding this admission, pertinent questions answered.

## 2021-08-17 NOTE — Progress Notes (Signed)
Problem: Falls - Risk of  Goal: *Absence of Falls  Description: Document Beverly Nguyen Fall Risk and appropriate interventions in the flowsheet.  Outcome: Progressing Towards Goal  Note: Fall Risk Interventions:       Mentation Interventions: Adequate sleep, hydration, pain control, Bed/chair exit alarm, Door open when patient unattended, Evaluate medications/consider consulting pharmacy, Reorient patient    Medication Interventions: Bed/chair exit alarm, Evaluate medications/consider consulting pharmacy, Patient to call before getting OOB    Elimination Interventions: Call light in reach, Patient to call for help with toileting needs, Toilet paper/wipes in reach       Problem: Pain  Goal: *Control of Pain  Outcome: Progressing Towards Goal  Goal: *PALLIATIVE CARE:  Alleviation of Pain  Outcome: Progressing Towards Goal     Problem: Nutrition Deficit  Goal: *Optimize nutritional status  Outcome: Progressing Towards Goal     Problem: Patient Education: Go to Patient Education Activity  Goal: Patient/Family Education  Outcome: Progressing Towards Goal     Problem: General Medical Care Plan  Goal: *Vital signs within specified parameters  Outcome: Progressing Towards Goal  Goal: *Labs within defined limits  Outcome: Progressing Towards Goal  Goal: *Absence of infection signs and symptoms  Outcome: Progressing Towards Goal  Goal: *Optimal pain control at patient's stated goal  Outcome: Progressing Towards Goal  Goal: *Skin integrity maintained  Outcome: Progressing Towards Goal  Goal: *Fluid volume balance  Outcome: Progressing Towards Goal  Goal: *Optimize nutritional status  Outcome: Progressing Towards Goal  Goal: *Anxiety reduced or absent  Outcome: Progressing Towards Goal  Goal: *Progressive mobility and function (eg: ADL's)  Outcome: Progressing Towards Goal     Problem: Patient Education: Go to Patient Education Activity  Goal: Patient/Family Education  Outcome: Progressing Towards Goal

## 2021-08-17 NOTE — Hospice (Signed)
PRN Visit to get patient ready for emergency respite.      Writer assessed patient for report to facility.  VS taken as well.  Writer got patient medications, supplies and clothing ready to send to facility.  Writer packed one pack of diapers, chux, 2 packs of mouth swabs (one blue and one green), 2 wash basins, a tube of barrier cream, a tube of PolyGrip and a tube of zinc cream.  Writer also packed patient two (2) gowns.  Below is the count of pills sent with patient to facility.      Writer informed POA and Sig, in home caregiver of what was packed for patient and that when patient returns home supplies will be ordered.        Haldol - 7mL  Morphine - 29mL  Donepezil - 5 tablets  Trazadone - 9 tablets  Memantine - 30 tablets  Sertraline - 6 tablets  Levothyroxine - 5 tablets

## 2021-08-17 NOTE — Progress Notes (Signed)
New orders received and implemented

## 2021-08-17 NOTE — Progress Notes (Signed)
Progress Notes by Roney Marion, RN at 08/17/21 2129                Author: Roney Marion, RN  Service: NURSING  Author Type: Registered Nurse       Filed: 08/17/21 2130  Date of Service: 08/17/21 2129  Status: Signed          Editor: Roney Marion, RN (Registered Nurse)               Physical Exam   Skin:

## 2021-08-17 NOTE — Progress Notes (Signed)
Chart reviewed for admission purpose.

## 2021-08-18 ENCOUNTER — Encounter: Admit: 2021-08-18 | Discharge: 2021-08-18 | Payer: MEDICARE

## 2021-08-18 LAB — COVID-19 RAPID TEST: COVID-19 rapid test: NOT DETECTED

## 2021-08-18 LAB — COVID-19, RAPID: SARS-CoV-2, Rapid: NOT DETECTED

## 2021-08-18 MED ORDER — ACETAMINOPHEN 325 MG RECTAL SUPPOSITORY
325 mg | Freq: Four times a day (QID) | RECTAL | Status: DC | PRN
Start: 2021-08-18 — End: 2021-08-18

## 2021-08-18 MED ORDER — ONDANSETRON (PF) 4 MG/2 ML INJECTION
4 mg/2 mL | Freq: Four times a day (QID) | INTRAMUSCULAR | Status: DC | PRN
Start: 2021-08-18 — End: 2021-08-18

## 2021-08-18 MED ORDER — ONDANSETRON 4 MG TAB, RAPID DISSOLVE
4 mg | Freq: Three times a day (TID) | ORAL | Status: DC | PRN
Start: 2021-08-18 — End: 2021-08-18

## 2021-08-18 MED ORDER — POLYETHYLENE GLYCOL 3350 17 GRAM (100 %) ORAL POWDER PACKET
17 gram | Freq: Every day | ORAL | Status: DC | PRN
Start: 2021-08-18 — End: 2021-08-18

## 2021-08-18 MED ORDER — MORPHINE CONCENTRATE 20 MG/ML ORAL
100520 mg/5 mL (20 mg/mL) | ORAL | Status: DC | PRN
Start: 2021-08-18 — End: 2021-08-18

## 2021-08-18 MED ORDER — SODIUM CHLORIDE 0.9 % IJ SYRG
Freq: Three times a day (TID) | INTRAMUSCULAR | Status: DC
Start: 2021-08-18 — End: 2021-08-18

## 2021-08-18 MED ORDER — LEVOTHYROXINE 25 MCG TAB
25 mcg | ORAL | Status: DC
Start: 2021-08-18 — End: 2021-08-18
  Administered 2021-08-18: 10:00:00 via ORAL

## 2021-08-18 MED ORDER — METOPROLOL SUCCINATE SR 25 MG 24 HR TAB
25 mg | Freq: Every day | ORAL | Status: DC
Start: 2021-08-18 — End: 2021-08-18
  Administered 2021-08-18: 13:00:00 via ORAL

## 2021-08-18 MED ORDER — TRAZODONE 50 MG TAB
50 mg | Freq: Every evening | ORAL | Status: AC
Start: 2021-08-18 — End: 2021-08-18
  Administered 2021-08-18: 03:00:00 via ORAL

## 2021-08-18 MED ORDER — ENOXAPARIN 30 MG/0.3 ML SUB-Q SYRINGE
30 mg/0.3 mL | Freq: Every day | SUBCUTANEOUS | Status: DC
Start: 2021-08-18 — End: 2021-08-18

## 2021-08-18 MED ORDER — SERTRALINE 50 MG TAB
50 mg | Freq: Every day | ORAL | Status: AC
Start: 2021-08-18 — End: 2021-08-18
  Administered 2021-08-18: 13:00:00 via ORAL

## 2021-08-18 MED ORDER — DONEPEZIL 5 MG TAB
5 mg | Freq: Every evening | ORAL | Status: AC
Start: 2021-08-18 — End: 2021-08-18
  Administered 2021-08-18: 03:00:00 via ORAL

## 2021-08-18 MED ORDER — MEMANTINE 10 MG TAB
10 mg | Freq: Two times a day (BID) | ORAL | Status: AC
Start: 2021-08-18 — End: 2021-08-18
  Administered 2021-08-18 (×2): via ORAL

## 2021-08-18 MED ORDER — HALOPERIDOL 2 MG/ML ORAL CONCENTRATE
2 mg/mL | Freq: Four times a day (QID) | ORAL | Status: DC | PRN
Start: 2021-08-18 — End: 2021-08-18

## 2021-08-18 MED ORDER — SODIUM CHLORIDE 0.9 % IJ SYRG
INTRAMUSCULAR | Status: DC | PRN
Start: 2021-08-18 — End: 2021-08-18

## 2021-08-18 MED ORDER — ACETAMINOPHEN 325 MG TABLET
325 mg | Freq: Four times a day (QID) | ORAL | Status: DC | PRN
Start: 2021-08-18 — End: 2021-08-18

## 2021-08-18 MED FILL — TRAZODONE 50 MG TAB: 50 mg | ORAL | Qty: 1

## 2021-08-18 MED FILL — LEVOTHYROXINE 25 MCG TAB: 25 mcg | ORAL | Qty: 1

## 2021-08-18 MED FILL — METOPROLOL SUCCINATE SR 25 MG 24 HR TAB: 25 mg | ORAL | Qty: 1

## 2021-08-18 MED FILL — MEMANTINE 10 MG TAB: 10 mg | ORAL | Qty: 1

## 2021-08-18 MED FILL — BD POSIFLUSH NORMAL SALINE 0.9 % INJECTION SYRINGE: INTRAMUSCULAR | Qty: 40

## 2021-08-18 MED FILL — SERTRALINE 50 MG TAB: 50 mg | ORAL | Qty: 1

## 2021-08-18 MED FILL — DONEPEZIL 5 MG TAB: 5 mg | ORAL | Qty: 2

## 2021-08-18 NOTE — Progress Notes (Signed)
 Progress Notes by Eda Chew, PHARMD at 08/18/21 1256                Author: Eda Chew, PHARMD  Service: Pharmacist  Author Type: Pharmacist       Filed: 08/18/21 1256  Date of Service: 08/18/21 1256  Status: Signed          Editor: Eda Chew, PHARMD (Pharmacist)                       Pharmacist Review and Automatic Dose Adjustment of Prophylactic Enoxaparin      *Review reason for admission/hospital problem list*      The reviewing pharmacist has made an adjustment to the ordered enoxaparin dose or converted to UFH per the approved [facility] protocol and table as identified below.           Beverly Nguyen is a 86 y.o. female.       No lab exists for component: CREATININE      CrCl cannot be calculated (Patient's most recent lab result is older than the maximum 180 days allowed.).      Height:      Ht Readings from Last 1 Encounters:        08/18/21  154.9 cm (61)       Weight:     Wt Readings from Last 1 Encounters:        08/17/21  42.4 kg (93 lb 6.4 oz)                         Plan: Based upon the patient's weight and renal function, the ordered enoxaparin dose of 40mg  q24h has been changed/converted to 30mg  q 24 h          Thank you,   Chew Eda, [facility]

## 2021-08-18 NOTE — Progress Notes (Signed)
Spoke with [facility] with Hospice concerning patient. Per [facility], patient will [facility] picked [facility].  Ty Hilts, care coordinator, with [facility] is on phone with Marcelle Smiling, Psychologist, counselling of [facility] to discuss hospice.    Shella Maxim, BSN, RN  Pager # [phone]  Care Manager

## 2021-08-18 NOTE — Hospice (Signed)
 Writer to bedside to do a tuck in visit to facility for respite.  Patient not at facility as of yet.  Writer spoke with facility nurse, facility aide and unit manager to give report on patient            Patient arrived to facility at 1521.  Patient appears very scared and disoriented.  Patient normally oriented to herself.  Patient did tell this nurse that she remembered writers voice.  Writer provided incontinence care to patient for a small extremely soft bowl movement.  During skin assessment, writer found a 3 X 2 L-shaped skin tear on her right mid-forearm and a darkened red ear with two 1cm circular denuded areas.  [place] has Haematologist come to bedside to observe new skin wounds not previously reported.  Writer gave measurement to Press photographer.  Right forearm covered with a 4X4 foam dressing.  Writer did a med req of the medications brought to facility by patient, did a 2-RN check of Morphine.  Facility Unit Manager informed this nurse that facility does not utilize Haldol and asked this nurse to take it with her.  Writer explained to facility staff that this nurse could not take medications with her and the POA was out of town.  Facility placed a DO NOT USE sticker on her Haldol.  Facility asked for a copy of the DNR or the patient will be a full code.  Writer called and spoke to [facility] office staff to fax the POST to facility.  Writer showed facility staff all of the supplies and clothing brought with patient.        Patient refused vital signs during assessment.  Patient foley changed in hospital.  Bag is stained purple in color as well as the tubing.

## 2021-08-18 NOTE — Progress Notes (Signed)
 Comprehensive Nutrition Assessment    Type and Reason for Visit: Initial, Positive nutrition screen    Nutrition Recommendations/Plan:   Add supplement: Ensure Enlive BID  Continue all other nutrition interventions. Encourage/ monitor po intake of meals and supplements.      Malnutrition Assessment:  Malnutrition Status:  Severe malnutrition (08/18/21 1224)    Context:  Chronic illness     Findings of the 6 clinical characteristics of malnutrition:   Energy Intake:  Unable to assess  Weight Loss:  20% over 1 year     Body Fat Loss:  Severe body fat loss, Orbital, Buccal region   Muscle Mass Loss:  Severe muscle mass loss, Temples (temporalis) (moderate losses in calves and clavicles)  Fluid Accumulation:  No significant fluid accumulation,    Grip Strength:  Not performed     Nutrition History and Allergies:   Past medical hx:  alzheimer's dementia.     UBW is 100 lb per pt report.   Currently weighing 93 lb per chart documentation.  RD checked wt on 08/18/21; pt weighing 93.4 lb.  Pt was weighing 128 lb on 01/11/21 per chart hx;  wt loss of 35 lb, 27.3% x 7 months PTA.  No known food allergies     Nutrition Assessment:    Pt with alzheimer's dementia. She reported not eating breakfast because she did not feel like it; encouraged pt to try lunch. Pt agreeable to nutrition supplement. Is underweight per BMI for age. NFPE conducted.    Nutrition Related Findings:    BM 10/3.   No edema.   Synthroid, namenda Wound Type: Pressure injury, Stage I    Current Nutrition Intake & Therapies:  Average Meal Intake: Unable to assess  Average Supplement Intake: None ordered  ADULT DIET Dysphagia - Minced & Moist    Anthropometric Measures:  Height: 5' 1 (154.9 cm)  Ideal Body Weight (IBW): 105 lbs (48 kg)  Admission Body Weight: 93 lb 7.6 oz  Current Body Wt:  42.4 kg (93 lb 7.6 oz), 89 % IBW. Bed scale  Current BMI (kg/m2): 17.7  Usual Body Weight: 45.4 kg (100 lb)  % Weight Change (Calculated): -6.5  Weight Adjustment: No  adjustment  BMI Category: Underweight (BMI less than 22) age over 55    Estimated Daily Nutrient Needs:  Energy Requirements Based On: Kcal/kg  Weight Used for Energy Requirements: Admission  Energy (kcal/day): 8727-8515  Weight Used for Protein Requirements: Admission  Protein (g/day): 42-51  Method Used for Fluid Requirements: 1 ml/kcal  Fluid (ml/day): 8727-8515    Nutrition Diagnosis:   Severe malnutrition, In context of chronic illness related to cognitive or neurological impairment, inadequate protein-energy intake as evidenced by weight loss greater than or equal to 20% in 1 year, severe muscle loss, severe loss of subcutaneous fat    Nutrition Interventions:   Food and/or Nutrient Delivery: Continue current diet, Start oral nutrition supplement  Nutrition Education/Counseling: Education not indicated  Coordination of Nutrition Care: Continue to monitor while inpatient  Plan of Care discussed with: pt    Goals:     Goals: Meet at least 75% of estimated needs, by next RD assessment       Nutrition Monitoring and Evaluation:   Behavioral-Environmental Outcomes: None identified  Food/Nutrient Intake Outcomes: Diet advancement/tolerance, Food and nutrient intake, Supplement intake  Physical Signs/Symptoms Outcomes: Biochemical data, Chewing or swallowing, Meal time behavior, Nutrition focused physical findings    Discharge Planning:    Continue oral nutrition supplement, Continue  current diet    Alan Page, RD  Contact: [phone]

## 2021-08-18 NOTE — Progress Notes (Signed)
 Chaplain conducted an initial consultation and Spiritual Assessment for Beverly Nguyen, who is a 86 y.o.,female. Patient's Primary Language is: [place].   According to the patient's EMR Religious Affiliation is: Unknown.     The reason the Patient came to the hospital is:   Patient Active Problem List    Diagnosis Date Noted    Alzheimer's dementia [facility]) 08/18/2021    Fall 01/04/2021    Dementia (HCC) 01/04/2021    Subdural hematoma 12/15/2020        The Chaplain provided the following Interventions:  Initiated a relationship of care and support.   Explored issues of faith, belief, spirituality and religious/ritual needs while hospitalized.  Listened empathically.  Provided chaplaincy education.  Provided information about Spiritual Care Services.  Offered prayer and assurance of continued prayers on patient's behalf.   Chart reviewed.    The following outcomes where achieved:  Patient shared limited information about both her medical narrative and spiritual journey/beliefs.  Chaplain confirmed Patient's No preference  on Religious Affiliation.  Patient's feeling about current hospitalization is not verified. Patient is able to hold conversation with regards to her past spiritual practices and where she gets her strength to keep her going. She said it's from her son who has five children.  Patient expressed gratitude for chaplain's visit.    Assessment:  Patient does not have any religious/cultural needs that will affect patient's preferences in health care.  There are no spiritual or religious issues which require intervention at this time.     Plan:  Chaplains will continue to follow and will provide pastoral care on an as needed/requested basis.  Chaplain recommends bedside caregivers page chaplain on duty if patient shows signs of acute spiritual or emotional distress.    Chaplain Ping Camano, [facility]  Spiritual Care   [phone]

## 2021-08-19 ENCOUNTER — Encounter

## 2021-08-20 ENCOUNTER — Encounter

## 2021-08-21 ENCOUNTER — Encounter

## 2021-08-25 ENCOUNTER — Encounter

## 2021-08-26 ENCOUNTER — Encounter

## 2021-08-27 ENCOUNTER — Encounter

## 2021-08-28 ENCOUNTER — Encounter

## 2021-09-01 ENCOUNTER — Encounter

## 2021-09-02 ENCOUNTER — Encounter

## 2021-09-03 ENCOUNTER — Encounter

## 2021-09-04 ENCOUNTER — Encounter

## 2021-09-08 ENCOUNTER — Encounter

## 2021-09-09 ENCOUNTER — Encounter

## 2021-09-10 ENCOUNTER — Encounter

## 2021-09-11 ENCOUNTER — Encounter

## 2021-09-15 ENCOUNTER — Encounter

## 2021-09-15 DEATH — deceased

## 2021-09-16 ENCOUNTER — Encounter

## 2021-09-17 ENCOUNTER — Encounter

## 2021-09-18 ENCOUNTER — Encounter

## 2021-09-22 ENCOUNTER — Encounter

## 2021-09-23 ENCOUNTER — Encounter

## 2021-09-24 ENCOUNTER — Encounter

## 2021-09-25 ENCOUNTER — Encounter

## 2021-09-29 ENCOUNTER — Encounter

## 2021-09-30 ENCOUNTER — Encounter

## 2021-10-01 ENCOUNTER — Encounter

## 2021-10-02 ENCOUNTER — Encounter

## 2021-10-06 ENCOUNTER — Encounter

## 2021-10-07 ENCOUNTER — Encounter

## 2021-10-08 ENCOUNTER — Encounter

## 2021-10-09 ENCOUNTER — Encounter

## 2021-10-13 ENCOUNTER — Encounter

## 2021-10-14 ENCOUNTER — Encounter

## 2021-10-15 ENCOUNTER — Encounter

## 2021-10-16 ENCOUNTER — Encounter

## 2021-10-19 ENCOUNTER — Encounter

## 2021-10-20 ENCOUNTER — Encounter

## 2021-10-21 ENCOUNTER — Encounter

## 2021-10-23 ENCOUNTER — Encounter

## 2021-10-28 ENCOUNTER — Encounter

## 2021-10-30 ENCOUNTER — Encounter

## 2021-11-04 ENCOUNTER — Encounter

## 2021-11-06 ENCOUNTER — Encounter
